# Patient Record
Sex: Female | Born: 1940 | Race: White | Hispanic: No | Smoking: Former smoker
Health system: Southern US, Community
[De-identification: ages and names within clinical notes are randomized; demographics above are authoritative.]

## PROBLEM LIST (undated history)

## (undated) DIAGNOSIS — M1990 Unspecified osteoarthritis, unspecified site: Secondary | ICD-10-CM

## (undated) DIAGNOSIS — I10 Essential (primary) hypertension: Secondary | ICD-10-CM

## (undated) DIAGNOSIS — Z86010 Personal history of colonic polyps: Secondary | ICD-10-CM

## (undated) DIAGNOSIS — M8580 Other specified disorders of bone density and structure, unspecified site: Secondary | ICD-10-CM

## (undated) DIAGNOSIS — A630 Anogenital (venereal) warts: Secondary | ICD-10-CM

## (undated) DIAGNOSIS — Z860101 Personal history of adenomatous and serrated colon polyps: Secondary | ICD-10-CM

## (undated) DIAGNOSIS — E039 Hypothyroidism, unspecified: Secondary | ICD-10-CM

## (undated) DIAGNOSIS — E079 Disorder of thyroid, unspecified: Secondary | ICD-10-CM

## (undated) DIAGNOSIS — J45909 Unspecified asthma, uncomplicated: Secondary | ICD-10-CM

## (undated) DIAGNOSIS — T7840XA Allergy, unspecified, initial encounter: Secondary | ICD-10-CM

## (undated) DIAGNOSIS — D049 Carcinoma in situ of skin, unspecified: Secondary | ICD-10-CM

## (undated) DIAGNOSIS — H269 Unspecified cataract: Secondary | ICD-10-CM

## (undated) DIAGNOSIS — C801 Malignant (primary) neoplasm, unspecified: Secondary | ICD-10-CM

## (undated) DIAGNOSIS — G5600 Carpal tunnel syndrome, unspecified upper limb: Secondary | ICD-10-CM

## (undated) HISTORY — PX: CHOLECYSTECTOMY: SHX55

## (undated) HISTORY — PX: EYE SURGERY: SHX253

## (undated) HISTORY — DX: Unspecified cataract: H26.9

## (undated) HISTORY — PX: GALLBLADDER SURGERY: SHX652

## (undated) HISTORY — DX: Carpal tunnel syndrome, unspecified upper limb: G56.00

## (undated) HISTORY — DX: Unspecified asthma, uncomplicated: J45.909

## (undated) HISTORY — DX: Personal history of adenomatous and serrated colon polyps: Z86.0101

## (undated) HISTORY — PX: CATARACT EXTRACTION, BILATERAL: SHX1313

## (undated) HISTORY — DX: Essential (primary) hypertension: I10

## (undated) HISTORY — PX: TUBAL LIGATION: SHX77

## (undated) HISTORY — DX: Allergy, unspecified, initial encounter: T78.40XA

## (undated) HISTORY — DX: Anogenital (venereal) warts: A63.0

## (undated) HISTORY — PX: BREAST BIOPSY: SHX20

## (undated) HISTORY — DX: Other specified disorders of bone density and structure, unspecified site: M85.80

## (undated) HISTORY — DX: Carcinoma in situ of skin, unspecified: D04.9

## (undated) HISTORY — DX: Personal history of colonic polyps: Z86.010

## (undated) HISTORY — DX: Disorder of thyroid, unspecified: E07.9

## (undated) HISTORY — DX: Unspecified osteoarthritis, unspecified site: M19.90

---

## 1998-09-08 ENCOUNTER — Other Ambulatory Visit: Admission: RE | Admit: 1998-09-08 | Discharge: 1998-09-08 | Payer: Self-pay | Admitting: Obstetrics and Gynecology

## 1998-10-29 ENCOUNTER — Encounter: Admission: RE | Admit: 1998-10-29 | Discharge: 1998-12-02 | Payer: Self-pay | Admitting: Family Medicine

## 1999-01-21 ENCOUNTER — Other Ambulatory Visit: Admission: RE | Admit: 1999-01-21 | Discharge: 1999-01-21 | Payer: Self-pay | Admitting: Obstetrics and Gynecology

## 1999-08-23 ENCOUNTER — Other Ambulatory Visit: Admission: RE | Admit: 1999-08-23 | Discharge: 1999-08-23 | Payer: Self-pay | Admitting: Obstetrics and Gynecology

## 2000-01-21 ENCOUNTER — Other Ambulatory Visit: Admission: RE | Admit: 2000-01-21 | Discharge: 2000-01-21 | Payer: Self-pay | Admitting: Obstetrics and Gynecology

## 2000-07-24 ENCOUNTER — Other Ambulatory Visit: Admission: RE | Admit: 2000-07-24 | Discharge: 2000-07-24 | Payer: Self-pay | Admitting: Obstetrics and Gynecology

## 2001-01-22 ENCOUNTER — Other Ambulatory Visit: Admission: RE | Admit: 2001-01-22 | Discharge: 2001-01-22 | Payer: Self-pay | Admitting: Obstetrics and Gynecology

## 2002-01-24 ENCOUNTER — Other Ambulatory Visit: Admission: RE | Admit: 2002-01-24 | Discharge: 2002-01-24 | Payer: Self-pay | Admitting: Obstetrics and Gynecology

## 2002-06-21 ENCOUNTER — Emergency Department (HOSPITAL_COMMUNITY): Admission: EM | Admit: 2002-06-21 | Discharge: 2002-06-21 | Payer: Self-pay | Admitting: Emergency Medicine

## 2003-02-13 ENCOUNTER — Other Ambulatory Visit: Admission: RE | Admit: 2003-02-13 | Discharge: 2003-02-13 | Payer: Self-pay | Admitting: Obstetrics and Gynecology

## 2004-03-09 ENCOUNTER — Other Ambulatory Visit: Admission: RE | Admit: 2004-03-09 | Discharge: 2004-03-09 | Payer: Self-pay | Admitting: Obstetrics and Gynecology

## 2004-06-16 DIAGNOSIS — D049 Carcinoma in situ of skin, unspecified: Secondary | ICD-10-CM

## 2004-06-16 HISTORY — DX: Carcinoma in situ of skin, unspecified: D04.9

## 2004-07-11 HISTORY — PX: SHOULDER SURGERY: SHX246

## 2005-04-04 ENCOUNTER — Other Ambulatory Visit: Admission: RE | Admit: 2005-04-04 | Discharge: 2005-04-04 | Payer: Self-pay | Admitting: Obstetrics and Gynecology

## 2006-04-06 ENCOUNTER — Other Ambulatory Visit: Admission: RE | Admit: 2006-04-06 | Discharge: 2006-04-06 | Payer: Self-pay | Admitting: Obstetrics & Gynecology

## 2006-04-12 ENCOUNTER — Encounter: Admission: RE | Admit: 2006-04-12 | Discharge: 2006-06-20 | Payer: Self-pay | Admitting: Internal Medicine

## 2008-04-11 ENCOUNTER — Other Ambulatory Visit: Admission: RE | Admit: 2008-04-11 | Discharge: 2008-04-11 | Payer: Self-pay | Admitting: Obstetrics and Gynecology

## 2008-04-22 ENCOUNTER — Other Ambulatory Visit: Admission: RE | Admit: 2008-04-22 | Discharge: 2008-04-22 | Payer: Self-pay | Admitting: Obstetrics and Gynecology

## 2008-12-10 ENCOUNTER — Encounter (INDEPENDENT_AMBULATORY_CARE_PROVIDER_SITE_OTHER): Payer: Self-pay | Admitting: *Deleted

## 2009-01-13 ENCOUNTER — Ambulatory Visit: Payer: Self-pay | Admitting: Internal Medicine

## 2009-01-28 ENCOUNTER — Ambulatory Visit: Payer: Self-pay | Admitting: Internal Medicine

## 2009-01-28 ENCOUNTER — Encounter: Payer: Self-pay | Admitting: Internal Medicine

## 2009-01-29 ENCOUNTER — Encounter: Payer: Self-pay | Admitting: Internal Medicine

## 2009-09-17 ENCOUNTER — Encounter: Payer: Self-pay | Admitting: Internal Medicine

## 2010-08-10 NOTE — Letter (Signed)
Summary: [facility]  [facility]   Imported By: Lester Spindale 09/25/2009 10:52:00  _____________________________________________________________________  External Attachment:    Type:   Image     Comment:   External Document

## 2010-10-06 ENCOUNTER — Telehealth: Payer: Self-pay | Admitting: *Deleted

## 2010-10-06 NOTE — Telephone Encounter (Signed)
See above

## 2010-12-02 ENCOUNTER — Encounter: Payer: Self-pay | Admitting: Internal Medicine

## 2010-12-02 ENCOUNTER — Ambulatory Visit (INDEPENDENT_AMBULATORY_CARE_PROVIDER_SITE_OTHER): Payer: Medicare Other | Admitting: Internal Medicine

## 2010-12-02 VITALS — BP 134/82 | HR 88 | Ht 61.0 in | Wt 152.0 lb

## 2010-12-02 DIAGNOSIS — R195 Other fecal abnormalities: Secondary | ICD-10-CM

## 2010-12-02 NOTE — Patient Instructions (Signed)
Dr Timothy Lasso

## 2010-12-02 NOTE — Progress Notes (Signed)
Emma [name] 1941/01/15 MRN [id number]     History of Present Illness:  This is a 70 year old white female with positive Hemosure cards on a home screening. A repeat exam was negative. She denies any symptoms. Her blood count according to her was normal during her complete physical exam by Dr. Timothy Lasso recently. A colonoscopy in 2003 showed hyperplastic polyps. A recall colonoscopy in July 2010 showed a tubular adenoma and hyperplastic polyp. She is due for a recall colonoscopy in July 2015.   Past Medical History  Diagnosis Date  . Diverticulosis   . Hx of adenomatous colonic polyps   . Constipation   . Hypertension   . Thyroid disease   . Vitamin D deficiency    Past Surgical History  Procedure Date  . Gallbladder surgery   . Cesarean section     x 2   . Shoulder surgery     right   . Breast biopsy     right     reports that she has been smoking.  She has never used smokeless tobacco. She reports that she drinks alcohol. She reports that she does not use illicit drugs. family history includes Diabetes in her paternal grandfather and Heart disease in her father.  There is no history of Colon cancer. Allergies  Allergen Reactions  . Penicillins     REACTION: rash hives        Review of Systems: Denies visible blood per rectum. Occasional constipation. Occasional diarrhea. Denies his symptomatic hemorrhoids.  The remainder of the 10  point ROS is negative except as outlined in H&P   Physical Exam: General appearance  Well developed, in no distress. Eyes- non icteric. HEENT nontraumatic, normocephalic. Mouth no lesions, tongue papillated, no cheilosis. Neck supple without adenopathy, thyroid not enlarged, no carotid bruits, no JVD. Lungs Clear to auscultation bilaterally. Cor normal S1 normal S2, regular rhythm , no murmur,  quiet precordium. Abdomen soft nontender abdomen with normal active bowel sounds. Mildly protuberant. No tenderness. Liver edge at costal  margin. Rectal: Normal perianal area. Normal rectal sphincter tone. Stool is soft Hemoccult-negative. Extremities no pedal edema. Skin no lesions. Neurological alert and oriented x 3. Psychological normal mood and affect.  Assessment and Plan:  Hemoccult-positive stool on a home screening test. This was not reproduced on the subsequent test or on my physical exam today. She is up-to-date on her colonoscopy. She is not anemic. We will hold off on a repeat colonoscopy until her due date in 2015. She understands that if she develops visible blood per rectum or bowel habit change or if she is anemic during one of her physical exams by Dr. Timothy Lasso,  we will reconsider timing of a recall colonoscopy. If no change, she will receive a recall colonoscopy letter for July 2015.   12/02/2010 Lina Sar

## 2011-12-12 ENCOUNTER — Other Ambulatory Visit: Payer: Self-pay | Admitting: Dermatology

## 2013-01-04 ENCOUNTER — Other Ambulatory Visit: Payer: Self-pay | Admitting: Dermatology

## 2013-02-12 DIAGNOSIS — M1711 Unilateral primary osteoarthritis, right knee: Secondary | ICD-10-CM | POA: Insufficient documentation

## 2013-04-01 ENCOUNTER — Telehealth: Payer: Self-pay

## 2013-04-01 NOTE — Telephone Encounter (Signed)
Consult with RA in [place] for tomorrow at 3 pm, aware to arrive at 2:45 Address and phone number to [place] office given

## 2013-04-02 ENCOUNTER — Ambulatory Visit (INDEPENDENT_AMBULATORY_CARE_PROVIDER_SITE_OTHER): Payer: 59 | Admitting: Pulmonary Disease

## 2013-04-02 ENCOUNTER — Encounter: Payer: Self-pay | Admitting: Pulmonary Disease

## 2013-04-02 VITALS — BP 124/76 | HR 71 | Ht 60.5 in | Wt 143.0 lb

## 2013-04-02 DIAGNOSIS — J449 Chronic obstructive pulmonary disease, unspecified: Secondary | ICD-10-CM | POA: Insufficient documentation

## 2013-04-02 DIAGNOSIS — J209 Acute bronchitis, unspecified: Secondary | ICD-10-CM

## 2013-04-02 MED ORDER — ALBUTEROL SULFATE (2.5 MG/3ML) 0.083% IN NEBU: 2.5000 mg | INHALATION_SOLUTION | Freq: Four times a day (QID) | RESPIRATORY_TRACT | Status: DC | PRN

## 2013-04-02 NOTE — Assessment & Plan Note (Signed)
Breathing test (spirometry-pre/post) next visit to decide long term need for advair You have to QUIT smoking

## 2013-04-02 NOTE — Patient Instructions (Signed)
You had an episode of acute bronchitis Complete medrol dosepak Rx for albuterol nebs will be sent x 30 to take as needed OK to stop dymista & take benzonatate as needed Breathing test (spirometry-pre/post) next visit to decide long term need for advair You have to QUIT smoking

## 2013-04-02 NOTE — Assessment & Plan Note (Signed)
You had an episode of acute bronchitis Complete medrol dosepak Rx for albuterol nebs will be sent x 30 to take as needed OK to stop dymista & take benzonatate as needed

## 2013-04-02 NOTE — Progress Notes (Signed)
Subjective:    Patient ID: Emma [name], female    DOB: 07-19-1940, 72 y.o.   MRN: [id number]  HPI 72 year old smoker presents for evaluation of shortness of breath and wheezing. She has been maintained on Advair and albuterol when necessary for rescue for 5 years. She smoked about 30-pack-years starting in her 73s and is now at half-pack per day. She developed sudden onset tickly cough about 10 days ago with wheezing and green phlegm. She went to urgent care, chest x-ray was without infiltrates or effusions. She was given Solu-Medrol IM, azithromycin and oral prednisone. A week later her she went back and was again given a Medrol Dosepak and Levaquin which she did not take .  Pt  Now c/o wheezing, has some cough w/ clear phlem, clear drainage from nose, PND, tickle in throat, breathing is better.  She reports a childhood history of asthma that she grew out of this a young adult. She is maintained on oral methotrexate by her dermatologist at [facility].   Past Medical History  Diagnosis Date  . Hx of adenomatous colonic polyps   . Constipation   . Hypertension   . Thyroid disease   . Vitamin D deficiency   . Asthma     as child    Past Surgical History  Procedure Laterality Date  . Gallbladder surgery    . Cesarean section      x 2   . Shoulder surgery      right   . Breast biopsy      right   . Tubal ligation      Allergies  Allergen Reactions  . Penicillins     REACTION: rash hives    History   Social History  . Marital Status: Divorced    Spouse Name: N/A    Number of Children: 2  . Years of Education: N/A   Occupational History  . Retired    Social History Main Topics  . Smoking status: Current Every Day Smoker -- 0.50 packs/day for 50 years    Types: Cigarettes  . Smokeless tobacco: Never Used  . Alcohol Use: Yes     Comment: socially  . Drug Use: No  . Sexual Activity: Not on file   Other Topics Concern  . Not on file   Social History  Narrative   2 caffeine drinks daily      Review of Systems  Constitutional: Positive for unexpected weight change. Negative for fever.  HENT: Positive for congestion, rhinorrhea, sneezing and postnasal drip. Negative for ear pain, nosebleeds, sore throat, trouble swallowing, dental problem and sinus pressure.   Eyes: Negative for redness and itching.  Respiratory: Positive for cough, shortness of breath and wheezing. Negative for chest tightness.   Cardiovascular: Negative for palpitations and leg swelling.  Gastrointestinal: Negative for nausea and vomiting.  Genitourinary: Negative for dysuria.  Musculoskeletal: Negative for joint swelling.  Skin: Negative for rash.  Neurological: Positive for headaches.  Hematological: Does not bruise/bleed easily.  Psychiatric/Behavioral: Negative for dysphoric mood. The patient is not nervous/anxious.        Objective:   Physical Exam  Gen. Pleasant, well-nourished, in no distress, normal affect ENT - no lesions, no post nasal drip Neck: No JVD, no thyromegaly, no carotid bruits Lungs: no use of accessory muscles, no dullness to percussion, no rales, faint exp rhonchi  Cardiovascular: Rhythm regular, heart sounds  normal, no murmurs or gallops, no peripheral edema Abdomen: soft and non-tender, no hepatosplenomegaly, BS normal.  Musculoskeletal: No deformities, no cyanosis or clubbing Neuro:  alert, non focal       Assessment & Plan:

## 2013-04-05 ENCOUNTER — Telehealth: Payer: Self-pay | Admitting: Pulmonary Disease

## 2013-04-05 MED ORDER — PREDNISONE 10 MG PO TABS: ORAL_TABLET | ORAL | Status: DC

## 2013-04-05 NOTE — Telephone Encounter (Signed)
Prednisone 10 mg tabs  Take 2 tabs daily with food x 5ds, then 1 tab daily with food x 5ds then STOP Albuterol nebs tid

## 2013-04-05 NOTE — Telephone Encounter (Signed)
I spoke with pt. She stated she notice her wheezing was slight worse this AM after doing her albuterol neb tx. She is also having slight increase in SOB w/ activity. Denies any cough and no chest tx. She finished her last medrol dospak tablet this AM. She is requesting further recs. Please advise RA thanks  Allergies  Allergen Reactions  . Penicillins     REACTION: rash hives

## 2013-04-05 NOTE — Telephone Encounter (Signed)
I spoke with pt and is aware of recs. RX sent to the pharmacy. Nothing further needed

## 2013-05-09 ENCOUNTER — Encounter: Payer: Self-pay | Admitting: Pulmonary Disease

## 2013-05-09 ENCOUNTER — Ambulatory Visit (INDEPENDENT_AMBULATORY_CARE_PROVIDER_SITE_OTHER): Payer: 59 | Admitting: Pulmonary Disease

## 2013-05-09 VITALS — BP 128/78 | HR 81 | Temp 97.9°F | Ht 59.75 in | Wt 143.0 lb

## 2013-05-09 DIAGNOSIS — J4489 Other specified chronic obstructive pulmonary disease: Secondary | ICD-10-CM

## 2013-05-09 DIAGNOSIS — J449 Chronic obstructive pulmonary disease, unspecified: Secondary | ICD-10-CM

## 2013-05-09 NOTE — Progress Notes (Signed)
Spirometry pre and post done today. 

## 2013-05-09 NOTE — Patient Instructions (Signed)
OK to stop advair We discussed side effects of steroids You have to focus on smoking cessation We discussed benefits of screening CT scan for lung cancer Flu shot

## 2013-05-09 NOTE — Progress Notes (Signed)
  Subjective:    Patient ID: Emma [name], female    DOB: 01/30/1941, 72 y.o.   MRN: [id number]  HPI  72 year old smoker presents for FU of shortness of breath and wheezing attributed to acute bronchitis.  She has been maintained on Advair and albuterol when necessary for rescue for 5 years. She smoked about 30-pack-years starting in her 73s and is now at half-pack per day. She developed sudden onset tickly cough about 10 days ago with wheezing and green phlegm. She went to urgent care, chest x-ray was without infiltrates or effusions. She was given Solu-Medrol IM, azithromycin and oral prednisone. A week later her she went back and was again given a Medrol Dosepak and Levaquin which she did not take .  She reports a childhood history of asthma that she grew out of this a young adult.  She is maintained on oral methotrexate by her dermatologist at [facility].   05/09/2013  Chief Complaint  Patient presents with  . Follow-up    W/ pre/post spiro. Pt reports breathing is better. Coughs up very little clear phlem. Denies any wheezing and no chest tx.    Spirometry does not show airway obstruction, FEV1 1.61 -90%, FVC 2.10 - 87% with ratio 77 She has questions about lung cancer- wt loss of 4 lbs, about COPD. She wonder sif skin bruising is due to prednisone Pt Now c/o wheezing, has some cough w/ clear phlem, clear drainage from nose, PND, tickle in throat, breathing is better.   Past Medical History  Diagnosis Date  . Hx of adenomatous colonic polyps   . Constipation   . Hypertension   . Thyroid disease   . Vitamin D deficiency   . Asthma     as child     Review of Systems neg for any significant sore throat, dysphagia, itching, sneezing, nasal congestion or excess/ purulent secretions, fever, chills, sweats, unintended wt loss, pleuritic or exertional cp, hempoptysis, orthopnea pnd or change in chronic leg swelling. Also denies presyncope, palpitations, heartburn, abdominal pain,  nausea, vomiting, diarrhea or change in bowel or urinary habits, dysuria,hematuria, rash, arthralgias, visual complaints, headache, numbness weakness or ataxia.     Objective:   Physical Exam  Gen. Pleasant, well-nourished, in no distress ENT - no lesions, no post nasal drip Neck: No JVD, no thyromegaly, no carotid bruits Lungs: no use of accessory muscles, no dullness to percussion, clear without rales or rhonchi  Cardiovascular: Rhythm regular, heart sounds  normal, no murmurs or gallops, no peripheral edema Musculoskeletal: No deformities, no cyanosis or clubbing        Assessment & Plan:

## 2013-05-09 NOTE — Assessment & Plan Note (Signed)
No evidence of airway obstruction -OK to stop advair We discussed side effects of steroids You have to focus on smoking cessation We discussed benefits of screening CT scan for lung cancer Flu shot

## 2013-06-13 ENCOUNTER — Encounter: Payer: Self-pay | Admitting: Pulmonary Disease

## 2013-06-27 ENCOUNTER — Other Ambulatory Visit: Payer: Self-pay | Admitting: Orthopedic Surgery

## 2013-06-27 DIAGNOSIS — M25512 Pain in left shoulder: Secondary | ICD-10-CM

## 2013-07-01 ENCOUNTER — Ambulatory Visit
Admission: RE | Admit: 2013-07-01 | Discharge: 2013-07-01 | Disposition: A | Discharge status: Home / Self Care | Payer: Self-pay | Source: Ambulatory Visit | Attending: Orthopedic Surgery | Admitting: Orthopedic Surgery

## 2013-07-01 DIAGNOSIS — M25512 Pain in left shoulder: Secondary | ICD-10-CM

## 2013-07-07 ENCOUNTER — Other Ambulatory Visit: Payer: 59

## 2013-07-17 HISTORY — PX: SHOULDER SURGERY: SHX246

## 2013-07-18 ENCOUNTER — Ambulatory Visit: Payer: Self-pay | Admitting: Nurse Practitioner

## 2013-07-21 ENCOUNTER — Emergency Department (HOSPITAL_COMMUNITY)
Admission: EM | Admit: 2013-07-21 | Discharge: 2013-07-21 | Discharge status: Absent without leave | Payer: Medicare Other | Source: Home / Self Care

## 2013-07-22 DIAGNOSIS — Z9889 Other specified postprocedural states: Secondary | ICD-10-CM | POA: Insufficient documentation

## 2013-07-24 ENCOUNTER — Ambulatory Visit: Payer: Medicare Other | Attending: Orthopedic Surgery | Admitting: Physical Therapy

## 2013-07-24 DIAGNOSIS — M25519 Pain in unspecified shoulder: Secondary | ICD-10-CM | POA: Insufficient documentation

## 2013-07-24 DIAGNOSIS — IMO0001 Reserved for inherently not codable concepts without codable children: Secondary | ICD-10-CM | POA: Insufficient documentation

## 2013-07-26 ENCOUNTER — Ambulatory Visit: Payer: Medicare Other | Admitting: Physical Therapy

## 2013-07-30 ENCOUNTER — Ambulatory Visit: Payer: Medicare Other | Admitting: Physical Therapy

## 2013-08-02 ENCOUNTER — Ambulatory Visit: Payer: Medicare Other | Admitting: Physical Therapy

## 2013-08-06 ENCOUNTER — Ambulatory Visit: Payer: Medicare Other | Admitting: Physical Therapy

## 2013-08-09 ENCOUNTER — Ambulatory Visit: Payer: Medicare Other | Admitting: Physical Therapy

## 2013-08-13 ENCOUNTER — Ambulatory Visit: Payer: Medicare Other | Attending: Orthopedic Surgery | Admitting: Physical Therapy

## 2013-08-13 DIAGNOSIS — M25519 Pain in unspecified shoulder: Secondary | ICD-10-CM | POA: Insufficient documentation

## 2013-08-13 DIAGNOSIS — IMO0001 Reserved for inherently not codable concepts without codable children: Secondary | ICD-10-CM | POA: Insufficient documentation

## 2013-08-15 ENCOUNTER — Ambulatory Visit: Payer: Medicare Other | Admitting: Physical Therapy

## 2013-08-19 ENCOUNTER — Ambulatory Visit: Payer: Medicare Other | Admitting: Physical Therapy

## 2013-08-20 ENCOUNTER — Ambulatory Visit: Payer: Self-pay | Admitting: Nurse Practitioner

## 2013-08-20 ENCOUNTER — Ambulatory Visit: Payer: Medicare Other | Admitting: Physical Therapy

## 2013-08-23 ENCOUNTER — Ambulatory Visit: Payer: Medicare Other | Admitting: Physical Therapy

## 2013-08-26 DIAGNOSIS — Z79631 Long term (current) use of antimetabolite agent: Secondary | ICD-10-CM | POA: Insufficient documentation

## 2013-08-27 ENCOUNTER — Ambulatory Visit: Payer: Medicare Other | Admitting: Physical Therapy

## 2013-08-30 ENCOUNTER — Ambulatory Visit: Payer: Medicare Other | Admitting: Physical Therapy

## 2013-09-03 ENCOUNTER — Ambulatory Visit: Payer: Medicare Other | Admitting: Physical Therapy

## 2013-09-04 ENCOUNTER — Ambulatory Visit: Payer: Medicare Other | Admitting: Physical Therapy

## 2013-09-05 ENCOUNTER — Ambulatory Visit: Payer: Medicare Other | Admitting: Physical Therapy

## 2013-09-09 ENCOUNTER — Ambulatory Visit: Payer: Medicare Other | Attending: Orthopedic Surgery | Admitting: Physical Therapy

## 2013-09-09 DIAGNOSIS — IMO0001 Reserved for inherently not codable concepts without codable children: Secondary | ICD-10-CM | POA: Insufficient documentation

## 2013-09-09 DIAGNOSIS — M25519 Pain in unspecified shoulder: Secondary | ICD-10-CM | POA: Insufficient documentation

## 2013-09-12 ENCOUNTER — Ambulatory Visit: Payer: Medicare Other | Admitting: Physical Therapy

## 2013-09-13 ENCOUNTER — Ambulatory Visit (INDEPENDENT_AMBULATORY_CARE_PROVIDER_SITE_OTHER): Payer: Medicare Other | Admitting: Nurse Practitioner

## 2013-09-13 ENCOUNTER — Encounter: Payer: Self-pay | Admitting: Nurse Practitioner

## 2013-09-13 VITALS — BP 140/72 | HR 76 | Ht 60.25 in | Wt 145.0 lb

## 2013-09-13 DIAGNOSIS — Z01419 Encounter for gynecological examination (general) (routine) without abnormal findings: Secondary | ICD-10-CM

## 2013-09-13 DIAGNOSIS — E559 Vitamin D deficiency, unspecified: Secondary | ICD-10-CM

## 2013-09-13 DIAGNOSIS — R928 Other abnormal and inconclusive findings on diagnostic imaging of breast: Secondary | ICD-10-CM

## 2013-09-13 DIAGNOSIS — D049 Carcinoma in situ of skin, unspecified: Secondary | ICD-10-CM

## 2013-09-13 DIAGNOSIS — R921 Mammographic calcification found on diagnostic imaging of breast: Secondary | ICD-10-CM

## 2013-09-13 DIAGNOSIS — M899 Disorder of bone, unspecified: Secondary | ICD-10-CM

## 2013-09-13 DIAGNOSIS — Z8619 Personal history of other infectious and parasitic diseases: Secondary | ICD-10-CM

## 2013-09-13 DIAGNOSIS — M8580 Other specified disorders of bone density and structure, unspecified site: Secondary | ICD-10-CM

## 2013-09-13 DIAGNOSIS — M949 Disorder of cartilage, unspecified: Secondary | ICD-10-CM

## 2013-09-13 NOTE — Progress Notes (Signed)
Patient ID: Emma [name], female   DOB: 11-25-1940, 73 y.o.   MRN: [id number] 72 y.o. G2P2. Divorced Caucasian Fe here for annual exam. Recent left shoulder surgery and is in PT; her ROM is getting better.  Not SA.  Patient's last menstrual period was 12/09/1997.          Sexually active: no  The current method of family planning is abstinence and post menopausal status.    Exercising: yes  water aerobics twice weekly.  Not participating currently due to should surgery. Smoker:  yes  Health Maintenance: Pap:  07/17/12, WNL MMG:  09/20/12, needs diagnostic in one year Colonoscopy:  01/2009, repeat in 5 years- to get this year BMD:   08/2010, 0.2/-1.1/-0.5 TDaP:  UTD Shingles vaccine: 2012 ? past few years Labs: PCP   reports that she has been smoking Cigarettes.  She has a 5 pack-year smoking history. She has never used smokeless tobacco. She reports that she drinks alcohol. She reports that she does not use illicit drugs.  Past Medical History  Diagnosis Date  . Hx of adenomatous colonic polyps   . Constipation   . Hypertension   . Thyroid disease   . Vitamin D deficiency   . Asthma     as child    Past Surgical History  Procedure Laterality Date  . Gallbladder surgery    . Cesarean section      x 2   . Shoulder surgery  2006    right   . Breast biopsy      right   . Tubal ligation    . Shoulder surgery Left 07/17/13    refmoval of ca+, bone spurs, and partial tear of rotator cuff    Current Outpatient Prescriptions  Medication Sig Dispense Refill  . ADVAIR DISKUS 250-50 MCG/DOSE AEPB Inhale 1 puff into the lungs daily.       . folic acid (FOLVITE) 1 MG tablet Take 1 mg by mouth daily.      Marland Kitchen gabapentin (NEURONTIN) 300 MG capsule Take 300 mg by mouth daily.       Marland Kitchen ibuprofen (ADVIL,MOTRIN) 600 MG tablet Take 1 tablet by mouth every 8 (eight) hours as needed.      . indapamide (LOZOL) 2.5 MG tablet Take 5 mg by mouth daily.       Marland Kitchen KLOR-CON M20 20 MEQ tablet Take 20 mEq  by mouth 2 (two) times daily.       Marland Kitchen levothyroxine (SYNTHROID, LEVOTHROID) 50 MCG tablet Take 44 mcg by mouth daily.       . methotrexate (RHEUMATREX) 2.5 MG tablet Take 5 mg by mouth once a week.       . montelukast (SINGULAIR) 10 MG tablet Once daily      . Vitamin D, Ergocalciferol, (DRISDOL) 50000 UNITS CAPS Take 50,000 Units by mouth every 14 (fourteen) days.       Marland Kitchen albuterol (PROVENTIL) (2.5 MG/3ML) 0.083% nebulizer solution Take 3 mLs (2.5 mg total) by nebulization every 6 (six) hours as needed. DX 496  360 mL  1  . benzonatate (TESSALON) 100 MG capsule Take 100 mg by mouth 3 (three) times daily as needed for cough.      . Bisacodyl (DULCOLAX PO) Take by mouth. As needed for constipation        No current facility-administered medications for this visit.    Family History  Problem Relation Age of Onset  . Colon cancer Neg Hx   . Diabetes Paternal  Grandfather   . Asthma Son   . Heart disease Father     ROS:  Pertinent items are noted in HPI.  Otherwise, a comprehensive ROS was negative.  Exam:   BP 140/72  Pulse 76  Ht 5' 0.25" (1.53 m)  Wt 145 lb (65.772 kg)  BMI 28.10 kg/m2  LMP 12/09/1997 Height: 5' 0.25" (153 cm)  Ht Readings from Last 3 Encounters:  09/13/13 5' 0.25" (1.53 m)  05/09/13 4' 11.75" (1.518 m)  04/02/13 5' 0.5" (1.537 m)    General appearance: alert, cooperative and appears stated age Head: Normocephalic, without obvious abnormality, atraumatic Neck: no adenopathy, supple, symmetrical, trachea midline and thyroid normal to inspection and palpation Lungs: clear to auscultation bilaterally Breasts: normal appearance, no masses or tenderness Heart: regular rate and rhythm Abdomen: soft, non-tender; no masses,  no organomegaly Extremities: extremities normal, atraumatic, no cyanosis or edema Skin: Skin color, texture, turgor normal. No rashes or lesions Lymph nodes: Cervical, supraclavicular, and axillary nodes normal. No abnormal inguinal nodes  palpated Neurologic: Grossly normal   Pelvic: External genitalia:  no lesions or evidence of condyloma              Urethra:  normal appearing urethra with no masses, tenderness or lesions              Bartholin's and Skene's: normal                 Vagina: normal appearing vagina with normal color and discharge, no lesions              Cervix: anteverted              Pap taken: no Bimanual Exam:  Uterus:  normal size, contour, position, consistency, mobility, non-tender              Adnexa: no mass, fullness, tenderness               Rectovaginal: Confirms               Anus:  normal sphincter tone, no lesions  A:  Well Woman with normal exam  Postmenopausal off HRT since 6/99  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

## 2013-09-13 NOTE — Patient Instructions (Addendum)

## 2013-09-16 ENCOUNTER — Encounter: Payer: Self-pay | Admitting: Nurse Practitioner

## 2013-09-16 ENCOUNTER — Ambulatory Visit: Payer: Medicare Other | Admitting: Physical Therapy

## 2013-09-17 NOTE — Progress Notes (Signed)
Encounter reviewed by Dr. Yaw Escoto Silva.  

## 2013-09-19 ENCOUNTER — Ambulatory Visit: Payer: Medicare Other | Admitting: Physical Therapy

## 2013-09-24 ENCOUNTER — Ambulatory Visit: Payer: Medicare Other | Admitting: Physical Therapy

## 2013-09-25 ENCOUNTER — Ambulatory Visit: Payer: Medicare Other | Admitting: Physical Therapy

## 2013-09-27 ENCOUNTER — Ambulatory Visit: Payer: Medicare Other | Admitting: Physical Therapy

## 2013-10-01 ENCOUNTER — Ambulatory Visit: Payer: Medicare Other | Admitting: Physical Therapy

## 2013-10-04 ENCOUNTER — Ambulatory Visit: Payer: Medicare Other | Admitting: Physical Therapy

## 2013-10-07 ENCOUNTER — Ambulatory Visit: Payer: Medicare Other | Admitting: Physical Therapy

## 2013-10-08 ENCOUNTER — Encounter: Payer: Medicare Other | Admitting: Physical Therapy

## 2013-10-10 ENCOUNTER — Ambulatory Visit: Payer: Medicare Other | Attending: Orthopedic Surgery | Admitting: Physical Therapy

## 2013-10-10 DIAGNOSIS — IMO0001 Reserved for inherently not codable concepts without codable children: Secondary | ICD-10-CM | POA: Insufficient documentation

## 2013-10-10 DIAGNOSIS — M25519 Pain in unspecified shoulder: Secondary | ICD-10-CM | POA: Insufficient documentation

## 2013-10-14 ENCOUNTER — Ambulatory Visit: Payer: Medicare Other | Admitting: Physical Therapy

## 2013-10-14 DIAGNOSIS — IMO0001 Reserved for inherently not codable concepts without codable children: Secondary | ICD-10-CM | POA: Diagnosis not present

## 2013-10-17 ENCOUNTER — Ambulatory Visit: Payer: Medicare Other | Admitting: Physical Therapy

## 2013-10-17 DIAGNOSIS — IMO0001 Reserved for inherently not codable concepts without codable children: Secondary | ICD-10-CM | POA: Diagnosis not present

## 2013-10-21 ENCOUNTER — Ambulatory Visit: Payer: Medicare Other | Admitting: Physical Therapy

## 2013-10-21 DIAGNOSIS — IMO0001 Reserved for inherently not codable concepts without codable children: Secondary | ICD-10-CM | POA: Diagnosis not present

## 2013-10-22 ENCOUNTER — Telehealth: Payer: Self-pay | Admitting: *Deleted

## 2013-10-22 NOTE — Telephone Encounter (Signed)
Pt notified of BMD results per written note on hardcopy.  Pt is agreeable with recommendations and will wait until she sees PCP on Thursday to review Vitamin D results.  Encouraged pt to stop smoking per recommendation.

## 2013-10-23 LAB — PULMONARY FUNCTION TEST
FEF 25-75 Post: 1.55 L/sec
FEF 25-75 Pre: 1.33 L/sec
FEF2575-%Change-Post: 16 %
FEF2575-%Pred-Post: 101 %
FEF2575-%Pred-Pre: 87 %
FEV1-%Change-Post: 5 %
FEV1-%Pred-Post: 95 %
FEV1-%Pred-Pre: 90 %
FEV1-Post: 1.7 L
FEV1-Pre: 1.61 L
FEV1FVC-%Change-Post: 0 %
FEV1FVC-%Pred-Pre: 102 %
FEV6-%Change-Post: 6 %
FEV6-%Pred-Post: 98 %
FEV6-%Pred-Pre: 92 %
FEV6-Post: 2.22 L
FEV6-Pre: 2.09 L
FEV6FVC-%Pred-Post: 105 %
FEV6FVC-%Pred-Pre: 105 %
FVC-%Change-Post: 5 %
FVC-%Pred-Post: 93 %
FVC-%Pred-Pre: 87 %
FVC-Post: 2.22 L
Post FEV1/FVC ratio: 76 %
Post FEV6/FVC ratio: 100 %
Pre FEV1/FVC ratio: 77 %
Pre FEV6/FVC Ratio: 100 %

## 2013-10-24 ENCOUNTER — Ambulatory Visit: Payer: Medicare Other | Admitting: Physical Therapy

## 2013-10-24 DIAGNOSIS — IMO0001 Reserved for inherently not codable concepts without codable children: Secondary | ICD-10-CM | POA: Diagnosis not present

## 2013-10-28 ENCOUNTER — Ambulatory Visit: Payer: Medicare Other | Admitting: Physical Therapy

## 2013-10-28 DIAGNOSIS — IMO0001 Reserved for inherently not codable concepts without codable children: Secondary | ICD-10-CM | POA: Diagnosis not present

## 2013-10-30 ENCOUNTER — Encounter: Payer: Self-pay | Admitting: Internal Medicine

## 2013-10-31 ENCOUNTER — Ambulatory Visit: Payer: Medicare Other | Admitting: Physical Therapy

## 2013-10-31 DIAGNOSIS — IMO0001 Reserved for inherently not codable concepts without codable children: Secondary | ICD-10-CM | POA: Diagnosis not present

## 2013-11-04 ENCOUNTER — Ambulatory Visit: Payer: Medicare Other | Admitting: Physical Therapy

## 2013-11-08 ENCOUNTER — Ambulatory Visit: Payer: Medicare Other | Admitting: Physical Therapy

## 2013-12-03 ENCOUNTER — Encounter: Payer: Self-pay | Admitting: Internal Medicine

## 2013-12-20 ENCOUNTER — Ambulatory Visit (INDEPENDENT_AMBULATORY_CARE_PROVIDER_SITE_OTHER): Payer: Medicare Other

## 2013-12-20 VITALS — BP 159/78 | HR 78 | Resp 16 | Ht 60.5 in | Wt 146.0 lb

## 2013-12-20 DIAGNOSIS — M79606 Pain in leg, unspecified: Secondary | ICD-10-CM

## 2013-12-20 DIAGNOSIS — M79609 Pain in unspecified limb: Secondary | ICD-10-CM

## 2013-12-20 DIAGNOSIS — M7750 Other enthesopathy of unspecified foot: Secondary | ICD-10-CM

## 2013-12-20 DIAGNOSIS — M722 Plantar fascial fibromatosis: Secondary | ICD-10-CM

## 2013-12-20 DIAGNOSIS — M7670 Peroneal tendinitis, unspecified leg: Secondary | ICD-10-CM

## 2013-12-20 NOTE — Progress Notes (Signed)
   Subjective:    Patient ID: Emma [name], female    DOB: Oct 24, 1940, 73 y.o.   MRN: [id number]  HPI Comments: N foot pain L right forefoot and arch plantar D 2 years O athletic shoe wear after 2 hours C burning at forefoot and aching at arch A athletic shoe wear T sandal wearing, changing style of athletic shoes  Pt complains left posterior heel pain during water aerobics, since April and pt stopped exercising 3 month.  Pt states now hurts occasionally when turning the foot.  Foot Pain      Review of Systems  All other systems reviewed and are negative.      Objective:   Physical Exam 73 year old white female presents the office well-developed well-nourished oriented x3. Patient has a complaint of inferior arch pain or ache especially she tries to exercise worse or athletic shoes. She tried seem to cause pain after she been on for a while. She tried asics, she tried Writer and she tried saucony.. Patient continues to have pain points to the midline the plantar fascia both heels also some tenderness along the lateral ankle area. Tendon distribution of the left heel Achilles tendon is not painful tender symptomatic. Them operatory changes noted clinically and radiographically mild inferior calcaneal spurring and thickening plantar fascial structures are noted no signs of fracture no osseous abnormalities at Lisfranc's displacement noted on oblique view bilateral.      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 At this time fascial strapping is applied to both feet also recommended Tylenol or Advil as needed for pain ice pack we utilized the patient are taking methotrexate for arthritis. Patient is also advised to maintain good stable shoe such as a croc or some Birkenstock sandal no  barefoot or flimsy shoes or flip-flops. Again reappointed in 2-3 weeks for followup fascial strapping applied to both feet at this time to assess the benefits of possible future use of orthotic  Harriet Masson DPM

## 2013-12-20 NOTE — Patient Instructions (Signed)

## 2014-01-08 ENCOUNTER — Ambulatory Visit: Payer: Medicare Other

## 2014-01-15 ENCOUNTER — Ambulatory Visit (AMBULATORY_SURGERY_CENTER): Payer: Self-pay | Admitting: *Deleted

## 2014-01-15 VITALS — Ht 60.0 in | Wt 146.2 lb

## 2014-01-15 DIAGNOSIS — Z86010 Personal history of colonic polyps: Secondary | ICD-10-CM

## 2014-01-15 MED ORDER — MOVIPREP 100 G PO SOLR: ORAL | Status: DC

## 2014-01-15 NOTE — Progress Notes (Signed)
No allergies to eggs or soy. No problems with anesthesia.  Pt given Emmi instructions for colonoscopy  No oxygen use  No diet drug use  

## 2014-01-22 ENCOUNTER — Ambulatory Visit (INDEPENDENT_AMBULATORY_CARE_PROVIDER_SITE_OTHER): Payer: Medicare Other

## 2014-01-22 VITALS — BP 152/77 | HR 84 | Resp 12

## 2014-01-22 DIAGNOSIS — M7670 Peroneal tendinitis, unspecified leg: Secondary | ICD-10-CM

## 2014-01-22 DIAGNOSIS — M79606 Pain in leg, unspecified: Secondary | ICD-10-CM

## 2014-01-22 DIAGNOSIS — M778 Other enthesopathies, not elsewhere classified: Secondary | ICD-10-CM

## 2014-01-22 DIAGNOSIS — R609 Edema, unspecified: Secondary | ICD-10-CM

## 2014-01-22 DIAGNOSIS — G5762 Lesion of plantar nerve, left lower limb: Secondary | ICD-10-CM

## 2014-01-22 DIAGNOSIS — G5760 Lesion of plantar nerve, unspecified lower limb: Secondary | ICD-10-CM

## 2014-01-22 DIAGNOSIS — M79609 Pain in unspecified limb: Secondary | ICD-10-CM

## 2014-01-22 DIAGNOSIS — M7750 Other enthesopathy of unspecified foot: Secondary | ICD-10-CM

## 2014-01-22 DIAGNOSIS — M722 Plantar fascial fibromatosis: Secondary | ICD-10-CM

## 2014-01-22 DIAGNOSIS — M779 Enthesopathy, unspecified: Secondary | ICD-10-CM

## 2014-01-22 NOTE — Patient Instructions (Addendum)
WEARING INSTRUCTIONS FOR ORTHOTICS  Don't expect to be comfortable wearing your orthotic devices for the first time.  Like eyeglasses, you may be aware of them as time passes, they will not be uncomfortable and you will enjoy wearing them.  FOLLOW THESE INSTRUCTIONS EXACTLY!  1. Wear your orthotic devices for:       Not more than 1 hour the first day.       Not more than 2 hours the second day.       Not more than 3 hours the third day and so on.        Or wear them for as long as they feel comfortable.       If you experience discomfort in your feet or legs take them out.  When feet & legs feel       better, put them back in.  You do need to be consistent and wear them a little        everyday. 2.   If at any time the orthotic devices become acutely uncomfortable before the       time for that particular day, STOP WEARING THEM. 3.   On the next day, do not increase the wearing time. 4.   Subsequently, increase the wearing time by 15-30 minutes only if comfortable to do       so. 5.   You will be seen by your doctor about 2-4 weeks after you receive your orthotic       devices, at which time you will probably be wearing your devices comfortably        for about 8 hours or more a day. 6.   Some patients occasionally report mild aches or discomfort in other parts of the of       body such as the knees, hips or back after 3 or 4 consecutive hours of wear.  If this       is the case with you, do not extend your wearing time.  Instead, cut it back an hour or       two.  In all likelihood, these symptoms will disappear in a short period of time as your       body posture realigns itself and functions more efficiently. 7.   It is possible that your orthotic device may require some small changes or adjustment       to improve their function or make them more comfortable.   This is usually not done       before one to three months have elapsed.  These adjustments are made in        accordance  with the changed position your feet are assuming as a result of       improved biomechanical function. 8.   In women's shoes, it's not unusual for your heel to slip out of the shoe, particularly if       they are step-in-shoes.  If this is the case, try other shoes or other styles.  Try to       purchase shoes which have deeper heal seats or higher heel counters. 9.   Squeaking of orthotics devices in the shoes is due to the movement of the devices       when they are functioning normally.  To eliminate squeaking, simply dust some       baby powder into your shoes before inserting the devices.  If this does not work,          apply soap or wax to the edges of the orthotic devices or put a tissue into the shoes. 10. It is important that you follow these directions explicitly.  Failure to do so will simply       prolong the adjustment period or create problems which are easily avoided.  It makes       no difference if you are wearing your orthotic devices for only a few hours after        several months, so long as you are wearing them comfortably for those hours. 11. If you have any questions or complaints, contact our office.  We have no way of       knowing about your problems unless you tell us.  If we do not hear from you, we will       assume that you are proceeding well.    ICE INSTRUCTIONS  Apply ice or cold pack to the affected area at least 3 times a day for 10-15 minutes each time.  You should also use ice after prolonged activity or vigorous exercise.  Do not apply ice longer than 20 minutes at one time.  Always keep a cloth between your skin and the ice pack to prevent burns.  Being consistent and following these instructions will help control your symptoms.  We suggest you purchase a gel ice pack because they are reusable and do bit leak.  Some of them are designed to wrap around the area.  Use the method that works best for you.  Here are some other suggestions for icing.   Use a  frozen bag of peas or corn-inexpensive and molds well to your body, usually stays frozen for 10 to 20 minutes.  Wet a towel with cold water and squeeze out the excess until it's damp.  Place in a bag in the freezer for 20 minutes. Then remove and use.

## 2014-01-22 NOTE — Progress Notes (Signed)
   Subjective:    Patient ID: Emma [name], female    DOB: [birth year], 73 y.o.   MRN: [id number]  HPI ''B/L FEET STILL HURTING ESPECIALLY THE LT FOOT.''    Review of Systems no new findings or systemic changes noted     Objective:   Physical Exam 73 year old white female well-developed well-nourished returns he presents this time for followup of previous diagnosis plantar fasciitis and perineal tendinitis and generalized os arthropathy the foot. Should she had improvement of fascial strapping dictating was in place would likely benefit from orthoses however sometime in the last 5-7 days her left foot became exquisitely painful tender and swollen especially in the forefoot at the base of the third digit tenderness on direct lateral compression pain and dorsal flexion plantar flexion of the third toe and there is significant edema the left forefoot in particular third MTP joint compared to the adjacent joints and contralateral right foot. Patient describes no history of injury or trauma however cannot rule out possible stress fracture at this time. Next  Neurovascular status otherwise intact pedal pulses palpable DP +2/4 for PT one over 4 capillary refill time 3 seconds all digits. Epicritic and proprioceptive sensations intact and symmetric. DTRs not elicited nails unremarkable there is no edema no ecchymosis mild edema the left forefoot right foot is asymptomatic in the forefoot and had significant improvement in the plantar fascia arch bilateral. As such we discussed possibly orthoses patient may be beneficial with an OTC type orthotic x-rays taken at this time reveal no sign of fracture no other osseous abnormalities and have concerned about stress fracture however the cortices are all intact no displacements no pain and vibratory sensation is well this may just be a flareup of capsulitis or arthropathy of the left forefoot with subsequent neuroma symptomology second interspace and capsulitis  third MTP joint       Assessment & Plan:  Assessment this time is improving plantar fasciitis as well. Tendinitis with the use of fascial strapping and NSAID therapy I do feel patient would benefit from a functional orthotic or an OTC type power step orthotic. Patient is has a noncovered and we'll try power step orthotic to stabilize the foot arch is may also be beneficial to the forefoot and neuroma symptomology or capsulitis of the left third MTP joint.  Assessment #2 capsulitis third MTP left forefoot edema. Associated capsulitis third MTP joint and possible neuroma symptomology. Second interspace. Plan at this time patient placed in injection of 10 mg Kenalog in 20 mg Xylocaine plain to the second intermetatarsal space for therapeutic nerve block which also be helpful with capsulitis the MTP joint. Patient is dispensed orthotics for use at this time should also help with the fasciitis and capsulitis and posse neuroma symptomology. Instructions for use of the orthoses are given also recommended ice pack and ibuprofen as needed for pain return in one to 2 months if symptoms persist or fail to improve next  Harriet Masson DPM

## 2014-01-23 ENCOUNTER — Encounter: Payer: Self-pay | Admitting: Internal Medicine

## 2014-01-29 ENCOUNTER — Encounter: Payer: Self-pay | Admitting: Internal Medicine

## 2014-01-29 ENCOUNTER — Ambulatory Visit (AMBULATORY_SURGERY_CENTER): Payer: Medicare Other | Admitting: Internal Medicine

## 2014-01-29 VITALS — BP 137/67 | HR 66 | Temp 97.5°F | Resp 14 | Ht 60.0 in | Wt 146.0 lb

## 2014-01-29 DIAGNOSIS — Z86010 Personal history of colonic polyps: Secondary | ICD-10-CM

## 2014-01-29 DIAGNOSIS — D126 Benign neoplasm of colon, unspecified: Secondary | ICD-10-CM

## 2014-01-29 MED ORDER — SODIUM CHLORIDE 0.9 % IV SOLN: 500.0000 mL | INTRAVENOUS | Status: DC

## 2014-01-29 NOTE — Progress Notes (Signed)
Called to room to assist during endoscopic procedure.  Patient ID and intended procedure confirmed with present staff. Received instructions for my participation in the procedure from the performing physician.  

## 2014-01-29 NOTE — Progress Notes (Signed)
Procedure ends, to recovery, report given and VSS. 

## 2014-01-29 NOTE — Op Note (Signed)
[facility]  [address] [name], [address]   COLONOSCOPY PROCEDURE REPORT  PATIENT: [name], Emma  MR#: [id number] BIRTHDATE: [birth year] , 68  yrs. old GENDER: Female ENDOSCOPIST: Lafayette Dragon, MD REFERRED KT:GYBW Virgina Jock, M.D. PROCEDURE DATE:  01/29/2014 PROCEDURE:   Colonoscopy with cold biopsy polypectomy and Colonoscopy with snare polypectomy First Screening Colonoscopy - Avg.  risk and is 50 yrs.  old or older - No.  Prior Negative Screening - Now for repeat screening. N/A  History of Adenoma - Now for follow-up colonoscopy & has been > or = to 3 yrs.  Yes hx of adenoma.  Has been 3 or more years since last colonoscopy.  Polyps Removed Today? Yes. ASA CLASS:   Class II INDICATIONS:Prior colonoscopy in 2003 revealed multiple hyperplastic polyps.  Last colonoscopy July 2010 tubular adenoma removed. MEDICATIONS: MAC sedation, administered by CRNA and Propofol (Diprivan) 250 mg IV  DESCRIPTION OF PROCEDURE:   After the risks benefits and alternatives of the procedure were thoroughly explained, informed consent was obtained.  A digital rectal exam revealed no abnormalities of the rectum.   The LB [id number]-[id number]  endoscope was introduced through the anus and advanced to the cecum, which was identified by both the appendix and ileocecal valve. No adverse events experienced.   The quality of the prep was good, using MoviPrep  The instrument was then slowly withdrawn as the colon was fully examined.      COLON FINDINGS: Four smooth sessile polyps ranging between 3-74mm in size were found in the descending colon.  A polypectomy was performed with cold forceps and with a cold snare.  The resection was complete and the polyp tissue was completely retrieved.   There was moderate diverticulosis noted in the descending colon with associated muscular hypertrophy and luminal narrowing.  Retroflexed views revealed no abnormalities. The time to cecum=5 minutes  510 seconds.  Withdrawal time=11 minutes 51 seconds.  The scope was withdrawn and the procedure completed. COMPLICATIONS: There were no complications.  ENDOSCOPIC IMPRESSION: Four sessile polyps ranging between 3-52mm in size were found in the descending colon; polypectomy was performed with cold forceps and with a cold snare moderately severe diverticulosis of the left colon  RECOMMENDATIONS: 1.  Await pathology results 2.  high fiber diet Recall colonoscopy pending path report   eSigned:  Lafayette Dragon, MD 01/29/2014 11:26 AM   cc:   [name], Emma MR#: [id number]

## 2014-01-29 NOTE — Patient Instructions (Signed)
Discharge instructions given with verbal understanding. Handouts on polyps and diverticulosis. Resume previous medications. YOU HAD AN ENDOSCOPIC PROCEDURE TODAY AT THE [facility]: Refer to the procedure report that was given to you for any specific questions about what was found during the examination.  If the procedure report does not answer your questions, please call your gastroenterologist to clarify.  If you requested that your care partner not be given the details of your procedure findings, then the procedure report has been included in a sealed envelope for you to review at your convenience later.  YOU SHOULD EXPECT: Some feelings of bloating in the abdomen. Passage of more gas than usual.  Walking can help get rid of the air that was put into your GI tract during the procedure and reduce the bloating. If you had a lower endoscopy (such as a colonoscopy or flexible sigmoidoscopy) you may notice spotting of blood in your stool or on the toilet paper. If you underwent a bowel prep for your procedure, then you may not have a normal bowel movement for a few days.  DIET: Your first meal following the procedure should be a light meal and then it is ok to progress to your normal diet.  A half-sandwich or bowl of soup is an example of a good first meal.  Heavy or fried foods are harder to digest and may make you feel nauseous or bloated.  Likewise meals heavy in dairy and vegetables can cause extra gas to form and this can also increase the bloating.  Drink plenty of fluids but you should avoid alcoholic beverages for 24 hours.  ACTIVITY: Your care partner should take you home directly after the procedure.  You should plan to take it easy, moving slowly for the rest of the day.  You can resume normal activity the day after the procedure however you should NOT DRIVE or use heavy machinery for 24 hours (because of the sedation medicines used during the test).    SYMPTOMS TO REPORT  IMMEDIATELY: A gastroenterologist can be reached at any hour.  During normal business hours, 8:30 AM to 5:00 PM Monday through Friday, call [phone].  After hours and on weekends, please call the GI answering service at [phone] who will take a message and have the physician on call contact you.   Following lower endoscopy (colonoscopy or flexible sigmoidoscopy):  Excessive amounts of blood in the stool  Significant tenderness or worsening of abdominal pains  Swelling of the abdomen that is new, acute  Fever of 100F or higher  FOLLOW UP: If any biopsies were taken you will be contacted by phone or by letter within the next 1-3 weeks.  Call your gastroenterologist if you have not heard about the biopsies in 3 weeks.  Our staff will call the home number listed on your records the next business day following your procedure to check on you and address any questions or concerns that you may have at that time regarding the information given to you following your procedure. This is a courtesy call and so if there is no answer at the home number and we have not heard from you through the emergency physician on call, we will assume that you have returned to your regular daily activities without incident.  SIGNATURES/CONFIDENTIALITY: You and/or your care partner have signed paperwork which will be entered into your electronic medical record.  These signatures attest to the fact that that the information above on your After Visit Summary   has been reviewed and is understood.  Full responsibility of the confidentiality of this discharge information lies with you and/or your care-partner. 

## 2014-01-30 ENCOUNTER — Telehealth: Payer: Self-pay

## 2014-01-30 NOTE — Telephone Encounter (Signed)
  Follow up Call-  Call back number 01/29/2014  Post procedure Call Back phone  # [phone]  Permission to leave phone message Yes     Patient questions:  Do you have a fever, pain , or abdominal swelling? No. Pain Score  0 *  Have you tolerated food without any problems? Yes.    Have you been able to return to your normal activities? Yes.    Do you have any questions about your discharge instructions: Diet   No. Medications  No. Follow up visit  No.  Do you have questions or concerns about your Care? No.  Actions: * If pain score is 4 or above: No action needed, pain <4.

## 2014-02-03 ENCOUNTER — Encounter: Payer: Self-pay | Admitting: Internal Medicine

## 2014-03-26 ENCOUNTER — Ambulatory Visit: Payer: Medicare Other

## 2014-05-12 ENCOUNTER — Encounter: Payer: Self-pay | Admitting: Internal Medicine

## 2014-09-16 ENCOUNTER — Encounter: Payer: Self-pay | Admitting: Nurse Practitioner

## 2014-09-16 ENCOUNTER — Ambulatory Visit (INDEPENDENT_AMBULATORY_CARE_PROVIDER_SITE_OTHER): Payer: Medicare Other | Admitting: Nurse Practitioner

## 2014-09-16 VITALS — BP 154/78 | HR 72 | Ht 60.0 in | Wt 147.0 lb

## 2014-09-16 DIAGNOSIS — Z0000 Encounter for general adult medical examination without abnormal findings: Secondary | ICD-10-CM | POA: Diagnosis not present

## 2014-09-16 DIAGNOSIS — N898 Other specified noninflammatory disorders of vagina: Secondary | ICD-10-CM

## 2014-09-16 DIAGNOSIS — Z01419 Encounter for gynecological examination (general) (routine) without abnormal findings: Secondary | ICD-10-CM

## 2014-09-16 MED ORDER — ESTROGENS, CONJUGATED 0.625 MG/GM VA CREA: 1.0000 | TOPICAL_CREAM | Freq: Every day | VAGINAL | Status: DC

## 2014-09-16 NOTE — Progress Notes (Signed)
Patient ID: Emma [name], female   DOB: 10/10/1940, 74 y.o.   MRN: [id number] 74 y.o. G2P2000 Divorced  Caucasian Fe here for annual exam.  Left shoulder surgery with good outcome and good ROM.   Since last year bilateral cataracts.   Patient's last menstrual period was 12/09/1997.          Sexually active: Yes.    The current method of family planning is post menopausal status.    Exercising: Yes.    Gym/ health club routine includes water aerobics twice weekly. Smoker:  no  Health Maintenance: Pap:  07/17/12, negative  MMG:  10/09/13, Bi-Rads 2: benign Colonoscopy:  01/29/14, hyperplastic polyp x 2, repeat in 5 years BMD:   10/09/13, T-Score 1.4/-1.5/-1.2 TDaP:  04/2014 Shingles: 2012 Prevnar: will be done this spring Labs:  [name]   reports that she has been smoking Cigarettes.  She has a 5 pack-year smoking history. She has never used smokeless tobacco. She reports that she drinks about 6.0 oz of alcohol per week. She reports that she does not use illicit drugs.  Past Medical History  Diagnosis Date  . Hx of adenomatous colonic polyps   . Constipation   . Hypertension   . Thyroid disease   . Vitamin D deficiency   . Asthma     as child  . Osteopenia   . Condyloma   . 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 Allergy     Past Surgical History  Procedure Laterality Date  . Gallbladder surgery    . Cesarean section      x 2   . Shoulder surgery  2006    right   . Breast biopsy      right   . Tubal ligation    . Shoulder surgery Left 07/17/13    removal of ca+, bone spurs, and partial tear of rotator cuff  . Cataract extraction, bilateral Bilateral 01/2014, 02/2014    Current Outpatient Prescriptions  Medication Sig Dispense Refill  . ADVAIR DISKUS 250-50 MCG/DOSE AEPB Inhale 1 puff into the lungs daily.     . Bisacodyl (DULCOLAX PO) Take by mouth. As needed for constipation     . conjugated estrogens (PREMARIN) vaginal cream Use 1/2 g vaginally twice  weekly 93.2 g 3  . folic acid (FOLVITE) 1 MG tablet Take 1 mg by mouth daily.    Marland Kitchen gabapentin (NEURONTIN) 300 MG capsule Take 300 mg by mouth daily.     Marland Kitchen ibuprofen (ADVIL,MOTRIN) 600 MG tablet Take 1 tablet by mouth every 8 (eight) hours as needed.    . indapamide (LOZOL) 2.5 MG tablet Take 5 mg by mouth daily.     Marland Kitchen KLOR-CON M20 20 MEQ tablet Take 20 mEq by mouth 2 (two) times daily.     . methotrexate (RHEUMATREX) 2.5 MG tablet Take 5 mg by mouth once a week.     . montelukast (SINGULAIR) 10 MG tablet Once daily    . SYNTHROID 88 MCG tablet Take 44 mcg by mouth daily.  0  . Vitamin D, Ergocalciferol, (DRISDOL) 50000 UNITS CAPS Take 50,000 Units by mouth every 14 (fourteen) days.      No current facility-administered medications for this visit.    Family History  Problem Relation Age of Onset  . Colon cancer Neg Hx   . Diabetes Paternal Grandfather   . Asthma Son   . Heart disease Father     ROS:  Pertinent items are  noted in HPI.  Otherwise, a comprehensive ROS was negative.  Exam:   BP 154/78 mmHg  Pulse 72  Ht 5' (1.524 m)  Wt 147 lb (66.679 kg)  BMI 28.71 kg/m2  LMP 12/09/1997 Height: 5' (152.4 cm) Ht Readings from Last 3 Encounters:  09/16/14 5' (1.524 m)  01/29/14 5' (1.524 m)  01/15/14 5' (1.524 m)    General appearance: alert, cooperative and appears stated age Head: Normocephalic, without obvious abnormality, atraumatic Neck: no adenopathy, supple, symmetrical, trachea midline and thyroid normal to inspection and palpation Lungs: clear to auscultation bilaterally Breasts: normal appearance, no masses or tenderness Heart: regular rate and rhythm Abdomen: soft, non-tender; no masses,  no organomegaly Extremities: extremities normal, atraumatic, no cyanosis or edema Skin: Skin color, texture, turgor normal. No rashes or lesions Lymph nodes: Cervical, supraclavicular, and axillary nodes normal. No abnormal inguinal nodes palpated Neurologic: Grossly  normal   Pelvic: External genitalia:  no lesions              Urethra:  normal appearing urethra with no masses, tenderness or lesions              Bartholin's and Skene's: normal                 Vagina: normal appearing vagina with normal color and discharge, no lesions              Cervix: anteverted              Pap taken: Yes.   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 06/16/2004 Vit D deficiency now done by PCP History of Osteopenia - off Evista  Atrophic vaginitis  P:   Reviewed health and wellness pertinent to exam  Pap smear taken today  Mammogram is due 4/16  Will follow with the Affrim test - most likely vaginal discharge is from atrophy  Premarin vaginal cream 1/2 gm 1-2 times a week  Counseled with risk of DVT,CVA,cancer  Counseled on breast self exam, mammography screening, use and side effects of HRT, adequate intake of calcium and vitamin D, diet and exercise, Kegel's exercises return annually or prn  An After Visit Summary was printed and given to the patient.

## 2014-09-16 NOTE — Patient Instructions (Signed)

## 2014-09-17 LAB — WET PREP BY MOLECULAR PROBE
Candida species: NEGATIVE
Gardnerella vaginalis: NEGATIVE
Trichomonas vaginosis: NEGATIVE

## 2014-09-17 MED ORDER — ESTROGENS, CONJUGATED 0.625 MG/GM VA CREA: TOPICAL_CREAM | VAGINAL | Status: DC

## 2014-09-18 LAB — IPS PAP SMEAR ONLY

## 2014-09-21 NOTE — Progress Notes (Signed)
Encounter reviewed by Dr. Zimere Dunlevy Silva.  

## 2014-09-24 ENCOUNTER — Telehealth: Payer: Self-pay | Admitting: *Deleted

## 2014-09-24 NOTE — Telephone Encounter (Addendum)
Fax Medication Request From [facility] in regards to Premarin Vaginal Cream.  Fax States:  "Co-Pay too high for patient. Requests alternative if possible."  Last AEX:  09/16/14 with PG Next AEX: 09/17/15 with PG Last MMG (if hormonal medication request): 10/09/13 Breast Density Category B:; Bi-Rads 2: Benign Refill authorized: ?  Please advise alternative Ms. Patty.

## 2014-09-25 NOTE — Telephone Encounter (Signed)
With her insurance there may not be a less expensive estrogen vaginal option - she may ask about Vagifem - but usually Premarin is the least expensive.

## 2014-09-25 NOTE — Telephone Encounter (Signed)
Patient called and she said she has used samples of estrace cream in the past for years. Was wondering if Ms. Patty could send in a prescription in for that instead? She asked if this would be cheaper for her then the premarin? I told the patient that we wouldn't know if it would be cheaper or not, and that I would send Ms. Patty a message and give her a call back with what she says..  Please advise Ms. Patty, patient says if we call her back we can leave her a detailed message.

## 2014-09-25 NOTE — Telephone Encounter (Signed)
Patient calling to check on the status of this request.

## 2014-09-26 MED ORDER — ESTRADIOL 0.1 MG/GM VA CREA: TOPICAL_CREAM | VAGINAL | Status: DC

## 2014-09-26 NOTE — Telephone Encounter (Signed)
It is OK for her to have RX for Estrace vaginal cream.  She may have RX for a year. Same directions at 1/2 gm intravaginally and at the urethra pea size amount twice weekly.

## 2014-09-26 NOTE — Telephone Encounter (Signed)
Estrace Vaginal Cream #42.5 gm/3 rfs sent in to [facility] with directions written as below. Left detailed message on patient's vm that rx has been sent and directions (okay per patient).  Routed to provider for review, encounter closed.

## 2014-11-19 ENCOUNTER — Encounter: Payer: Self-pay | Admitting: Internal Medicine

## 2015-04-11 HISTORY — PX: BELPHAROPTOSIS REPAIR: SHX369

## 2015-09-17 ENCOUNTER — Ambulatory Visit: Payer: Medicare Other | Admitting: Nurse Practitioner

## 2015-09-30 ENCOUNTER — Encounter: Payer: Self-pay | Admitting: Nurse Practitioner

## 2015-09-30 ENCOUNTER — Ambulatory Visit (INDEPENDENT_AMBULATORY_CARE_PROVIDER_SITE_OTHER): Payer: Medicare Other | Admitting: Nurse Practitioner

## 2015-09-30 VITALS — BP 132/74 | HR 56 | Ht 60.25 in | Wt 150.0 lb

## 2015-09-30 DIAGNOSIS — M8580 Other specified disorders of bone density and structure, unspecified site: Secondary | ICD-10-CM | POA: Diagnosis not present

## 2015-09-30 DIAGNOSIS — Z0000 Encounter for general adult medical examination without abnormal findings: Secondary | ICD-10-CM | POA: Diagnosis not present

## 2015-09-30 DIAGNOSIS — E559 Vitamin D deficiency, unspecified: Secondary | ICD-10-CM | POA: Diagnosis not present

## 2015-09-30 DIAGNOSIS — Z01419 Encounter for gynecological examination (general) (routine) without abnormal findings: Secondary | ICD-10-CM

## 2015-09-30 NOTE — Patient Instructions (Signed)

## 2015-09-30 NOTE — Progress Notes (Signed)
Patient ID: Emma [name], female   DOB: 01-19-1941, 75 y.o.   MRN: [id number]  75 y.o. G79P2000 Divorced  Caucasian Fe here for annual exam.  She is having problems with low potassium and is being followed closely by PCP.  She is now off Synthroid due to low TSH.  She feels well.  She is not using estrogen vaginal cream secondary to cost.  Not SA.  Patient's last menstrual period was 12/09/1997 (approximate).          Sexually active: No.  The current method of family planning is abstinence.    Exercising: Yes.    Gym/ health club routine includes water aerobics. Smoker:  no  Health Maintenance: Pap:09/16/14, Negative  MMG: 10/13/14, 3D, Bi-Rads 2: Benign Colonoscopy: 01/29/14, hyperplastic polyp x 2, repeat in 5 years, IFOB given at PCP BMD: 10/09/13, T-Score 1.4 Spine / -1.5 Right Femur Neck / -1.2 Left Femur Neck TDaP: 04/2014 Shingles: 2012 Pneumovax: Prevnar 13 2016, series completed Hep C and HIV: Not indicated due to age Labs: [name] takes care of all labs   reports that she has been smoking Cigarettes.  She has a 5 pack-year smoking history. She has never used smokeless tobacco. She reports that she drinks about 6.0 oz of alcohol per week. She reports that she does not use illicit drugs.  Past Medical History  Diagnosis Date  . Hx of adenomatous colonic polyps   . Constipation   . Hypertension   . Thyroid disease   . Vitamin D deficiency   . Asthma     as child  . Osteopenia   . Condyloma   . 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 Allergy     Past Surgical History  Procedure Laterality Date  . Gallbladder surgery    . Cesarean section      x 2   . Shoulder surgery  2006    right   . Breast biopsy      right   . Tubal ligation    . Shoulder surgery Left 07/17/13    removal of ca+, bone spurs, and partial tear of rotator cuff  . Cataract extraction, bilateral Bilateral 01/2014, 02/2014  . 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 ADVAIR DISKUS 250-50 MCG/DOSE AEPB Inhale 1 puff into the lungs daily.     . Bisacodyl (DULCOLAX PO) Take by mouth. As needed for constipation     . folic acid (FOLVITE) 1 MG tablet Take 1 mg by mouth daily.    Marland Kitchen gabapentin (NEURONTIN) 300 MG capsule Take 300 mg by mouth daily.     Marland Kitchen ibuprofen (ADVIL,MOTRIN) 600 MG tablet Take 1 tablet by mouth every 8 (eight) hours as needed.    . indapamide (LOZOL) 2.5 MG tablet Take 5 mg by mouth daily.     Marland Kitchen KLOR-CON M20 20 MEQ tablet Take 20 mEq by mouth 3 (three) times daily.     . methotrexate (RHEUMATREX) 2.5 MG tablet Take 5 mg by mouth once a week.     . montelukast (SINGULAIR) 10 MG tablet Once daily    . Vitamin D, Ergocalciferol, (DRISDOL) 50000 UNITS CAPS Take 50,000 Units by mouth every 14 (fourteen) days.     Marland Kitchen conjugated estrogens (PREMARIN) vaginal cream Use 1/2 g vaginally twice weekly (Patient not taking: Reported on 09/30/2015) 42.5 g 3  . estradiol (ESTRACE VAGINAL) 0.1  MG/GM vaginal cream Use 1/2 gram intravaginally and at the urethra pea size amount twice weekly. (Patient not taking: Reported on 09/30/2015) 42.5 g 3   No current facility-administered medications for this visit.    Family History  Problem Relation Age of Onset  . Colon cancer Neg Hx   . Diabetes Paternal Grandfather   . Asthma Son   . Heart disease Father     ROS:  Pertinent items are noted in HPI.  Otherwise, a comprehensive ROS was negative.  Exam:   BP 132/74 mmHg  Pulse 56  Ht 5' 0.25" (1.53 m)  Wt 150 lb (68.04 kg)  BMI 29.07 kg/m2  LMP 12/09/1997 (Approximate) Height: 5' 0.25" (153 cm) Ht Readings from Last 3 Encounters:  09/30/15 5' 0.25" (1.53 m)  09/16/14 5' (1.524 m)  01/29/14 5' (1.524 m)    General appearance: alert, cooperative and appears stated age Head: Normocephalic, without obvious abnormality, atraumatic Neck: no adenopathy, supple, symmetrical,  trachea midline and thyroid normal to inspection and palpation Lungs: clear to auscultation bilaterally Breasts: normal appearance, no masses or tenderness Heart: regular rate and rhythm Abdomen: soft, non-tender; no masses,  no organomegaly Extremities: extremities normal, atraumatic, no cyanosis or edema Skin: Skin color, texture, turgor normal. No rashes or lesions Lymph nodes: Cervical, supraclavicular, and axillary nodes normal. No abnormal inguinal nodes palpated Neurologic: Grossly normal   Pelvic: External genitalia:  no lesions              Urethra:  normal appearing urethra with no masses, tenderness or lesions              Bartholin's and Skene's: normal                 Vagina: normal appearing vagina with normal color and discharge, no lesions              Cervix: anteverted              Pap taken: No. Bimanual Exam:  Uterus:  normal size, contour, position, consistency, mobility, non-tender              Adnexa: no mass, fullness, tenderness               Rectovaginal: Confirms               Anus:  normal sphincter tone, no lesions  Chaperone present: yes  A:  Well Woman with normal exam  Postmenopausal off HRT since 6/99 History of abnormal pap CIN I/ VAIN I with last Colpo Biopsy 6/ 1999 History of condyloma and Bowen's disease biopsy proven left superior vulva 06/16/2004 Vit D deficiency now done by PCP History of Osteopenia - off Evista Atrophic vaginitis   P:   Reviewed health and wellness pertinent to exam  Pap smear as above  Mammogram is due 10/2015  Counseled on breast self exam, mammography screening, adequate intake of calcium and vitamin D, diet and exercise, Kegel's exercises return annually or prn  An After Visit Summary was printed and given to the patient.

## 2015-10-01 NOTE — Progress Notes (Signed)
Encounter reviewed by Dr. Rebeccah Ivins Amundson C. Silva.  

## 2015-11-24 LAB — IFOBT (OCCULT BLOOD): IMMUNOLOGICAL FECAL OCCULT BLOOD TEST: POSITIVE

## 2016-01-25 ENCOUNTER — Ambulatory Visit: Payer: Medicare Other | Admitting: Podiatry

## 2016-02-29 ENCOUNTER — Encounter: Payer: Self-pay | Admitting: Gastroenterology

## 2016-04-12 ENCOUNTER — Encounter: Payer: Self-pay | Admitting: Gastroenterology

## 2016-04-12 ENCOUNTER — Ambulatory Visit (INDEPENDENT_AMBULATORY_CARE_PROVIDER_SITE_OTHER): Payer: Medicare Other | Admitting: Gastroenterology

## 2016-04-12 ENCOUNTER — Encounter (INDEPENDENT_AMBULATORY_CARE_PROVIDER_SITE_OTHER): Payer: Self-pay

## 2016-04-12 VITALS — BP 144/78 | HR 84 | Ht 61.0 in | Wt 150.0 lb

## 2016-04-12 DIAGNOSIS — R195 Other fecal abnormalities: Secondary | ICD-10-CM

## 2016-04-12 NOTE — Progress Notes (Signed)
[facility] Progress Note  Chief Complaint: heme positive stool   Subjective  History:  Emma [name] was referred by her primary care physician, Dr. Hebert Soho, for heme positive stool. She had a positive Hemosure test in May and again in August. She feels quite well, and denies any digestive symptoms such as change in bowel habits, rectal bleeding, chronic abdominal pain, nausea, vomiting, early satiety, dysphagia or weight loss. She had a visit for the same reason with Dr. Olevia Perches in May 2012, having had a colonoscopy with just 2 subcentimeter tubular adenomas in 2010. Dr. Olevia Perches waited until the next scheduled exam in 2015, which showed only a hyperplastic polyp. Lakida also brought copies of labs done in May, showing a normal hemoglobin. We scanned these into the chart.  ROS: Cardiovascular:  no chest pain Respiratory: no dyspnea  The patient's Past Medical, Family and Social History were reviewed and are on file in the EMR.  Objective:  Med list reviewed  Vital signs in last 24 hrs: Vitals:   04/12/16 1322  BP: (!) 144/78  Pulse: 84    Physical Exam    HEENT: sclera anicteric, oral mucosa moist without lesions  Neck: supple, no thyromegaly, JVD or lymphadenopathy  Cardiac: RRR without murmurs, S1S2 heard, no peripheral edema  Pulm: clear to auscultation bilaterally, normal RR and effort noted  Abdomen: soft, no tenderness, with active bowel sounds. No guarding or palpable hepatosplenomegaly.  Skin; warm and dry, no jaundice or rash Rectal: NST, no palpable internal lesions, Heme-negative stool  Recent Labs:  May 2017 labs scanned.  Hgb 13.7   @ASSESSMENTPLANBEGIN @ Assessment: Encounter Diagnosis  Name Primary?  . Heme positive stool Yes   No symptoms. This appears to be a false positive stool test. Plan: No colonoscopy is necessary at this time. No annual heme test for stool is necessary in this patient, at least not for 5 years after a normal screening  colonoscopy. She recalls getting a letter from Dr. Olevia Perches in 2015 saying she should've a repeat colonoscopy at a 5 year interval, but it is not clear why that should be so. Current screening recommendations say that patient comes back in 10 years unless they have aged out, which will be the case for her.  Total time 20 minutes, over half spent in counseling and coordination of care.   Nelida Meuse III  Dr Shon Baton, [facility]

## 2016-04-12 NOTE — Patient Instructions (Signed)
If you are age 76 or older, your body mass index should be between 23-30. Your Body mass index is 28.34 kg/m. If this is out of the aforementioned range listed, please consider follow up with your Primary Care Provider.  If you are age 19 or younger, your body mass index should be between 19-25. Your Body mass index is 28.34 kg/m. If this is out of the aformentioned range listed, please consider follow up with your Primary Care Provider.   Thank you for choosing [facility]  Dr Wilfrid Lund III

## 2016-10-03 ENCOUNTER — Encounter: Payer: Self-pay | Admitting: Nurse Practitioner

## 2016-10-03 ENCOUNTER — Ambulatory Visit (INDEPENDENT_AMBULATORY_CARE_PROVIDER_SITE_OTHER): Payer: Medicare Other | Admitting: Nurse Practitioner

## 2016-10-03 ENCOUNTER — Ambulatory Visit: Payer: Medicare Other | Admitting: Nurse Practitioner

## 2016-10-03 VITALS — BP 124/60 | HR 72 | Resp 14 | Ht 59.75 in | Wt 154.0 lb

## 2016-10-03 DIAGNOSIS — E559 Vitamin D deficiency, unspecified: Secondary | ICD-10-CM | POA: Diagnosis not present

## 2016-10-03 DIAGNOSIS — Z01411 Encounter for gynecological examination (general) (routine) with abnormal findings: Secondary | ICD-10-CM

## 2016-10-03 DIAGNOSIS — M8588 Other specified disorders of bone density and structure, other site: Secondary | ICD-10-CM | POA: Diagnosis not present

## 2016-10-03 DIAGNOSIS — N952 Postmenopausal atrophic vaginitis: Secondary | ICD-10-CM | POA: Diagnosis not present

## 2016-10-03 DIAGNOSIS — D049 Carcinoma in situ of skin, unspecified: Secondary | ICD-10-CM | POA: Diagnosis not present

## 2016-10-03 NOTE — Progress Notes (Signed)
Encounter reviewed by Dr. Brook Amundson C. Silva.  

## 2016-10-03 NOTE — Patient Instructions (Signed)

## 2016-10-03 NOTE — Progress Notes (Signed)
76 y.o. G18P2000 Divorced  Caucasian Fe here for annual exam. She feels well except for swelling of left ankle.  She was in the yard doing some work and later that day had swelling.  She is unsure if she twisted or turned it.   Patient's last menstrual period was 12/09/1997 (approximate).          Sexually active: No.  The current method of family planning is post menopausal status.    Exercising: Yes.    water aerobics Smoker:  no  Health Maintenance: Pap:  09/16/14, Negative           07/17/12, negative  MMG:  10/14/15 Bi-Rads 2 benign/density b -- scheduled 4/18 Colonoscopy:  01/29/14, hyperplastic polyp x 2,  BMD:   10/09/13, T-Score 1.4 Spine / -1.5 Right Femur Neck / -1.2 Left Femur Neck -- scheduled 4/18 TDaP:   04/2014 Shingles: 2012 Pneumonia: Prevnar 13: 2016, series completed Hep C and HIV: Not indicated due to age Labs: PCP takes care of all labs   reports that she has been smoking Cigarettes.  She has a 25.00 pack-year smoking history. She has never used smokeless tobacco. She reports that she drinks about 6.0 oz of alcohol per week . She reports that she does not use drugs.  Past Medical History:  Diagnosis Date  . Allergy   . Asthma    as child  . 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 Cataract   . Condyloma   . Constipation   . Hx of adenomatous colonic polyps   . Hypertension   . Osteoarthritis   . Osteopenia   . Thyroid disease   . Vitamin D deficiency     Past Surgical History:  Procedure Laterality Date  . BELPHAROPTOSIS REPAIR Bilateral 04/2015   vision correction  . BREAST BIOPSY     right   . CATARACT EXTRACTION, BILATERAL Bilateral 01/2014, 02/2014  . CESAREAN SECTION     x 2   . GALLBLADDER SURGERY    . SHOULDER SURGERY  2006   right   . SHOULDER SURGERY Left 07/17/13   removal of ca+, bone spurs, and partial tear of rotator cuff  . TUBAL LIGATION      Current Outpatient Prescriptions  Medication Sig Dispense Refill  . ADVAIR DISKUS  250-50 MCG/DOSE AEPB Inhale 1 puff into the lungs daily.     . folic acid (FOLVITE) 1 MG tablet Take 1 mg by mouth daily.    Marland Kitchen gabapentin (NEURONTIN) 300 MG capsule Take 300 mg by mouth daily.     Marland Kitchen ibuprofen (ADVIL,MOTRIN) 600 MG tablet Take 1 tablet by mouth every 8 (eight) hours as needed.    . indapamide (LOZOL) 2.5 MG tablet Take 5 mg by mouth daily.     Marland Kitchen KLOR-CON M20 20 MEQ tablet Take 20 mEq by mouth 3 (three) times daily.     . methotrexate (RHEUMATREX) 2.5 MG tablet Take 5 mg by mouth once a week.     . Vitamin D, Ergocalciferol, (DRISDOL) 50000 UNITS CAPS Take 50,000 Units by mouth every 14 (fourteen) days.     . montelukast (SINGULAIR) 10 MG tablet Once daily     No current facility-administered medications for this visit.     Family History  Problem Relation Age of Onset  . Heart disease Father   . Diabetes Paternal Grandfather   . Asthma Son   . Colon cancer Neg Hx     ROS:  Pertinent items  are noted in HPI.  Otherwise, a comprehensive ROS was negative.  Exam:   BP 124/60 (BP Location: Right Arm, Patient Position: Sitting, Cuff Size: Normal)   Pulse 72   Resp 14   Ht 4' 11.75" (1.518 m)   Wt 154 lb (69.9 kg)   LMP 12/09/1997 (Approximate)   BMI 30.33 kg/m  Height: 4' 11.75" (151.8 cm) Ht Readings from Last 3 Encounters:  10/03/16 4' 11.75" (1.518 m)  04/12/16 5\' 1"  (1.549 m)  09/30/15 5' 0.25" (1.53 m)    General appearance: alert, cooperative and appears stated age Head: Normocephalic, without obvious abnormality, atraumatic Neck: no adenopathy, supple, symmetrical, trachea midline and thyroid normal to inspection and palpation Lungs: clear to auscultation bilaterally Breasts: normal appearance, no masses or tenderness Heart: regular rate and rhythm Abdomen: soft, non-tender; no masses,  no organomegaly Extremities: extremities normal, atraumatic, no cyanosis or edema, left ankle swollen but good ROM Skin: Skin color, texture, turgor normal. No rashes or  lesions Lymph nodes: Cervical, supraclavicular, and axillary nodes normal. No abnormal inguinal nodes palpated Neurologic: Grossly normal   Pelvic: External genitalia:  no lesions, no evidence of Bowen's recurrence or condyloma              Urethra:  normal appearing urethra with no masses, tenderness or lesions              Bartholin's and Skene's: normal                 Vagina: normal appearing vagina with normal color and discharge, no lesions              Cervix: anteverted              Pap taken: No. 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

## 2016-10-31 ENCOUNTER — Telehealth: Payer: Self-pay | Admitting: Nurse Practitioner

## 2016-10-31 NOTE — Telephone Encounter (Signed)
Please let pt know that BMD done 10/14/16 shows the spine to be normal with T Score +0.70; the right hip at -1.40 and left hip at -1.10.  The lowest is at the right hip.  In comparison to previous exam dated 10/09/2013 there has been an increase at both hips and a decrease at the spine by -8%.  She must continue to do walking, water aerobics.  Still add upper body weights. Repeat in 2 yrs.

## 2016-11-10 ENCOUNTER — Encounter: Payer: Self-pay | Admitting: Nurse Practitioner

## 2016-11-11 NOTE — Telephone Encounter (Signed)
Patient is notified of results.  She is appreciative of call.  She will call or discuss at next annual exam if she has further questions.  Routing to provider for final review.  Closing encounter.

## 2017-05-31 ENCOUNTER — Other Ambulatory Visit: Payer: Self-pay | Admitting: Orthopedic Surgery

## 2017-07-05 NOTE — Pre-Procedure Instructions (Signed)
Emma [name]  07/05/2017      [id number], [name] [address] Phone: [phone] Fax: [phone]    Your procedure is scheduled on Monday, July 17, 2017  Report to [facility] Admitting Entrance "A" at 7:15AM  Call this number if you have problems the morning of surgery:  [phone]   Remember:  Do not eat food or drink liquids after midnight.  Take these medicines the morning of surgery with A SIP OF WATER: Propylene Glycol (SYSTANE BALANCE OP) and ADVAIR DISKUS. If needed Acetaminophen (TYLENOL) for pain.  7 days before surgery (Dec. 31), stop taking all Aspirins, Vitamins, Fish oils, and Herbal medications. Also stop all NSAIDS i.e. Advil, Ibuprofen, Motrin, Aleve, Anaprox, Naproxen, BC and Goody Powders.   Do not wear jewelry, make-up or nail polish.  Do not wear lotions, powders, perfumes, or deodorant.  Do not shave 48 hours prior to surgery.    Do not bring valuables to the hospital.  [facility] is not responsible for any belongings or valuables.  Contacts, dentures or bridgework may not be worn into surgery.  Leave your suitcase in the car.  After surgery it may be brought to your room.  For patients admitted to the hospital, discharge time will be determined by your treatment team.  Patients discharged the day of surgery will not be allowed to drive home.   Special instructions:   [facility]- Preparing For Surgery  Before surgery, you can play an important role. Because skin is not sterile, your skin needs to be as free of germs as possible. You can reduce the number of germs on your skin by washing with CHG (chlorahexidine gluconate) Soap before surgery.  CHG is an antiseptic cleaner which kills germs and bonds with the skin to continue killing germs even after washing.  Please do not use if you have an allergy to CHG or antibacterial soaps. If your skin  becomes reddened/irritated stop using the CHG.  Do not shave (including legs and underarms) for at least 48 hours prior to first CHG shower. It is OK to shave your face.  Please follow these instructions carefully.   1. Shower the NIGHT BEFORE SURGERY and the MORNING OF SURGERY with CHG.   2. If you chose to wash your hair, wash your hair first as usual with your normal shampoo.  3. After you shampoo, rinse your hair and body thoroughly to remove the shampoo.  4. Use CHG as you would any other liquid soap. You can apply CHG directly to the skin and wash gently with a scrungie or a clean washcloth.   5. Apply the CHG Soap to your body ONLY FROM THE NECK DOWN.  Do not use on open wounds or open sores. Avoid contact with your eyes, ears, mouth and genitals (private parts). Wash Face and genitals (private parts)  with your normal soap.  6. Wash thoroughly, paying special attention to the area where your surgery will be performed.  7. Thoroughly rinse your body with warm water from the neck down.  8. DO NOT shower/wash with your normal soap after using and rinsing off the CHG Soap.  9. Pat yourself dry with a CLEAN TOWEL.  10. Wear CLEAN PAJAMAS to bed the night before surgery, wear comfortable clothes the morning of surgery  11. Place CLEAN SHEETS on your bed the night of your first shower and DO NOT SLEEP WITH  PETS.  Day of Surgery: Do not apply any deodorants/lotions. Please wear clean clothes to the hospital/surgery center.    Please read over the following fact sheets that you were given. Pain Booklet, Coughing and Deep Breathing, Total Joint Packet, MRSA Information and Surgical Site Infection Prevention

## 2017-07-06 ENCOUNTER — Other Ambulatory Visit: Payer: Self-pay

## 2017-07-06 ENCOUNTER — Encounter (HOSPITAL_COMMUNITY): Payer: Self-pay

## 2017-07-06 ENCOUNTER — Encounter (HOSPITAL_COMMUNITY)
Admission: RE | Admit: 2017-07-06 | Discharge: 2017-07-06 | Disposition: A | Discharge status: Home / Self Care | Payer: Medicare Other | Source: Ambulatory Visit | Attending: Orthopedic Surgery | Admitting: Orthopedic Surgery

## 2017-07-06 DIAGNOSIS — M1712 Unilateral primary osteoarthritis, left knee: Secondary | ICD-10-CM | POA: Insufficient documentation

## 2017-07-06 DIAGNOSIS — Z01812 Encounter for preprocedural laboratory examination: Secondary | ICD-10-CM | POA: Insufficient documentation

## 2017-07-06 DIAGNOSIS — I10 Essential (primary) hypertension: Secondary | ICD-10-CM | POA: Insufficient documentation

## 2017-07-06 DIAGNOSIS — E039 Hypothyroidism, unspecified: Secondary | ICD-10-CM | POA: Diagnosis not present

## 2017-07-06 DIAGNOSIS — J45909 Unspecified asthma, uncomplicated: Secondary | ICD-10-CM | POA: Diagnosis not present

## 2017-07-06 DIAGNOSIS — F17200 Nicotine dependence, unspecified, uncomplicated: Secondary | ICD-10-CM | POA: Insufficient documentation

## 2017-07-06 DIAGNOSIS — I498 Other specified cardiac arrhythmias: Secondary | ICD-10-CM | POA: Diagnosis not present

## 2017-07-06 DIAGNOSIS — Z01810 Encounter for preprocedural cardiovascular examination: Secondary | ICD-10-CM | POA: Diagnosis present

## 2017-07-06 HISTORY — DX: Hypothyroidism, unspecified: E03.9

## 2017-07-06 HISTORY — DX: Malignant (primary) neoplasm, unspecified: C80.1

## 2017-07-06 LAB — COMPREHENSIVE METABOLIC PANEL
ALBUMIN: 3.8 g/dL (ref 3.5–5.0)
ALT: 27 U/L (ref 14–54)
AST: 24 U/L (ref 15–41)
Alkaline Phosphatase: 91 U/L (ref 38–126)
Anion gap: 13 (ref 5–15)
BUN: 18 mg/dL (ref 6–20)
CHLORIDE: 100 mmol/L — AB (ref 101–111)
CO2: 27 mmol/L (ref 22–32)
CREATININE: 0.63 mg/dL (ref 0.44–1.00)
Calcium: 9.7 mg/dL (ref 8.9–10.3)
GFR calc non Af Amer: >60 mL/min (ref >60)
GLUCOSE: 79 mg/dL (ref 65–99)
Potassium: 2.9 mmol/L — ABNORMAL LOW (ref 3.5–5.1)
SODIUM: 140 mmol/L (ref 135–145)
Total Bilirubin: 0.6 mg/dL (ref 0.3–1.2)
Total Protein: 6.7 g/dL (ref 6.5–8.1)

## 2017-07-06 LAB — CBC WITH DIFFERENTIAL/PLATELET
BASOS ABS: 0 10*3/uL (ref 0.0–0.1)
BASOS PCT: 0 %
EOS ABS: 0.1 10*3/uL (ref 0.0–0.7)
EOS PCT: 1 %
HCT: 38.7 % (ref 36.0–46.0)
HEMOGLOBIN: 12.6 g/dL (ref 12.0–15.0)
LYMPHS ABS: 2.5 10*3/uL (ref 0.7–4.0)
Lymphocytes Relative: 25 %
MCH: 29.6 pg (ref 26.0–34.0)
MCHC: 32.6 g/dL (ref 30.0–36.0)
MCV: 90.8 fL (ref 78.0–100.0)
Monocytes Absolute: 0.8 10*3/uL (ref 0.1–1.0)
Monocytes Relative: 8 %
NEUTROS PCT: 66 %
Neutro Abs: 6.4 10*3/uL (ref 1.7–7.7)
PLATELETS: 351 10*3/uL (ref 150–400)
RBC: 4.26 MIL/uL (ref 3.87–5.11)
RDW: 14.7 % (ref 11.5–15.5)
WBC: 9.8 10*3/uL (ref 4.0–10.5)

## 2017-07-06 LAB — SURGICAL PCR SCREEN
MRSA, PCR: NEGATIVE
STAPHYLOCOCCUS AUREUS: NEGATIVE

## 2017-07-06 NOTE — Progress Notes (Signed)
Pt. Has swelling in the L ankle that she feles is caused by the pain & swelling at the L knee joint.  Pt. Reports that Dr. Ruel Favors office gave her Prednisone several weeks ago & that  helped with the swelling but its back to where it was before the prednisone was started.  Pt. Denies any chest concerns.  Pt. Followed by [name] for PCP, told pt. Not to hesitate to see or call [name] about the swelling if it continues to bother her.

## 2017-07-07 NOTE — Progress Notes (Signed)
Anesthesia Chart Review:  Pt is a 76 year old female scheduled for L total knee arthroplasty on 07/17/2017 with Vickey Huger, MD  - PCP is Shon Baton, MD  PMH includes:  HTN, asthma (as a child), hypothyroidism. Current smoker. BMI 29.  Medications include: Advair, potassium, methotrexate  BP (!) 162/82   Pulse 86   Temp 36.6 C (Oral)   Resp 20   Ht 4' 11.75" (1.518 m)   Wt 147 lb 11.3 oz (67 kg)   LMP 12/09/1997 (Approximate)   SpO2 100%   BMI 29.09 kg/m   Preoperative labs reviewed.   - K is 2.9. I notified Lolita in Dr. Ruel Favors office who will inform [place], [place].  Will recheck K day of surgery.   EKG 07/06/17: Sinus rhythm with marked sinus arrhythmia. Minimal voltage criteria for LVH, may be normal variant. Nonspecific ST abnormality  If K acceptable day of surgery, I anticipate pt can proceed with surgery as scheduled.   Willeen Cass, FNP-BC [facility]/Anesthesiology Phone: [phone] 07/07/2017 12:41 PM

## 2017-07-09 ENCOUNTER — Other Ambulatory Visit: Payer: Self-pay

## 2017-07-09 ENCOUNTER — Encounter (HOSPITAL_COMMUNITY): Payer: Self-pay | Admitting: Emergency Medicine

## 2017-07-09 ENCOUNTER — Emergency Department (HOSPITAL_COMMUNITY)
Admission: EM | Admit: 2017-07-09 | Discharge: 2017-07-09 | Disposition: A | Discharge status: Home / Self Care | Payer: Medicare Other | Attending: Emergency Medicine | Admitting: Emergency Medicine

## 2017-07-09 DIAGNOSIS — Z79899 Other long term (current) drug therapy: Secondary | ICD-10-CM | POA: Insufficient documentation

## 2017-07-09 DIAGNOSIS — J449 Chronic obstructive pulmonary disease, unspecified: Secondary | ICD-10-CM | POA: Insufficient documentation

## 2017-07-09 DIAGNOSIS — L2489 Irritant contact dermatitis due to other agents: Secondary | ICD-10-CM | POA: Insufficient documentation

## 2017-07-09 DIAGNOSIS — F17210 Nicotine dependence, cigarettes, uncomplicated: Secondary | ICD-10-CM | POA: Diagnosis not present

## 2017-07-09 DIAGNOSIS — Z859 Personal history of malignant neoplasm, unspecified: Secondary | ICD-10-CM | POA: Diagnosis not present

## 2017-07-09 DIAGNOSIS — R21 Rash and other nonspecific skin eruption: Secondary | ICD-10-CM | POA: Diagnosis present

## 2017-07-09 DIAGNOSIS — J45909 Unspecified asthma, uncomplicated: Secondary | ICD-10-CM | POA: Insufficient documentation

## 2017-07-09 LAB — CBC WITH DIFFERENTIAL/PLATELET
Basophils Absolute: 0 10*3/uL (ref 0.0–0.1)
Basophils Relative: 0 %
Eosinophils Absolute: 0.1 10*3/uL (ref 0.0–0.7)
Eosinophils Relative: 1 %
HCT: 39 % (ref 36.0–46.0)
Hemoglobin: 12.8 g/dL (ref 12.0–15.0)
Lymphocytes Relative: 25 %
Lymphs Abs: 2.3 10*3/uL (ref 0.7–4.0)
MCH: 29.6 pg (ref 26.0–34.0)
MCHC: 32.8 g/dL (ref 30.0–36.0)
MCV: 90.3 fL (ref 78.0–100.0)
Monocytes Absolute: 0.8 10*3/uL (ref 0.1–1.0)
Monocytes Relative: 9 %
Neutro Abs: 5.8 10*3/uL (ref 1.7–7.7)
Neutrophils Relative %: 65 %
Platelets: 351 10*3/uL (ref 150–400)
RBC: 4.32 MIL/uL (ref 3.87–5.11)
RDW: 14.6 % (ref 11.5–15.5)
WBC: 9 10*3/uL (ref 4.0–10.5)

## 2017-07-09 LAB — BASIC METABOLIC PANEL
Anion gap: 10 (ref 5–15)
BUN: 18 mg/dL (ref 6–20)
CO2: 25 mmol/L (ref 22–32)
Calcium: 9.8 mg/dL (ref 8.9–10.3)
Chloride: 100 mmol/L — ABNORMAL LOW (ref 101–111)
Creatinine, Ser: 0.69 mg/dL (ref 0.44–1.00)
GFR calc Af Amer: >60 mL/min (ref >60)
GFR calc non Af Amer: >60 mL/min (ref >60)
Glucose, Bld: 90 mg/dL (ref 65–99)
Potassium: 3.1 mmol/L — ABNORMAL LOW (ref 3.5–5.1)
Sodium: 135 mmol/L (ref 135–145)

## 2017-07-09 MED ORDER — PREDNISONE 50 MG PO TABS: 50.0000 mg | ORAL_TABLET | Freq: Every day | ORAL | 0 refills | Status: DC

## 2017-07-09 MED ORDER — TRIAMCINOLONE ACETONIDE 0.1 % EX CREA: 1.0000 "application " | TOPICAL_CREAM | Freq: Two times a day (BID) | CUTANEOUS | 0 refills | Status: DC

## 2017-07-09 MED ORDER — FAMOTIDINE 20 MG PO TABS: 20.0000 mg | ORAL_TABLET | Freq: Two times a day (BID) | ORAL | 0 refills | Status: DC

## 2017-07-09 NOTE — ED Triage Notes (Signed)
Pt has been using pain compound cream to L knee for several months.  On Friday she started having rash to L knee and L lower leg.  For the last hour she has felt sob, increased swallowing, and increased thirst.

## 2017-07-09 NOTE — Discharge Instructions (Signed)
Return here as needed. Follow up with your doctor. Take benadryl also for the itching and reaction

## 2017-07-12 NOTE — ED Provider Notes (Signed)
[facility] Provider Note   CSN: [id number] Arrival date & time: 07/09/17  2010     History   Chief Complaint Chief Complaint  Patient presents with  . Allergic Reaction    HPI Emma [name] is a 77 y.o. female.  HPI   Patient presents to the emergency department with rash to the left knee region along with some to the right knee region.  Patient states that she is unsure of any new soaps lotions or detergents.  She states she has been using a cream on her legs for several months but is never had a problem with this in the past.  Patient states she did not take any medications prior to arrival states the area does itch somewhat.  The area behind her left knee is the most excoriated and inflamed.  The patient denies chest pain, shortness of breath, headache,blurred vision, neck pain, fever, cough, weakness, numbness, dizziness, anorexia, edema, abdominal pain, nausea, vomiting, diarrhea,  back pain, dysuria, hematemesis, bloody stool, near syncope, or syncope. Past Medical History:  Diagnosis Date  . Allergy   . Asthma    as child  . 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 pt. unsure of pathology  . Cataract   . Condyloma   . Constipation   . Hx of adenomatous colonic polyps   . Hypertension   . Hypothyroidism    2016- synthroid d/c'd   . Osteoarthritis    OA- knee- L knee, R thumb  . Osteopenia   . Thyroid disease   . Vitamin D deficiency     Patient Active Problem List   Diagnosis Date Noted  . Acute bronchitis 04/02/2013  . COPD (chronic obstructive pulmonary disease) ([place]) 04/02/2013    Past Surgical History:  Procedure Laterality Date  . BELPHAROPTOSIS REPAIR Bilateral 04/2015   vision correction  . BREAST BIOPSY     right   . CATARACT EXTRACTION, BILATERAL Bilateral 01/2014, 02/2014   /w IOL  . CESAREAN SECTION     x 2   . CHOLECYSTECTOMY    .  GALLBLADDER SURGERY    . SHOULDER SURGERY  2006   right   . SHOULDER SURGERY Left 07/17/2013    bone spurs, and partial tear of rotator cuff  . TUBAL LIGATION      OB History    Gravida Para Term Preterm AB Living   2 2 2     2    SAB TAB Ectopic Multiple Live Births                   Home Medications    Prior to Admission medications   Medication Sig Start Date End Date Taking? Authorizing Provider  acetaminophen (TYLENOL) 500 MG tablet Take 1,000 mg by mouth every 6 (six) hours as needed for mild pain or moderate pain.    [provider]  ADVAIR DISKUS 250-50 MCG/DOSE AEPB Inhale 1 puff into the lungs daily.  11/13/10   [provider]  cholecalciferol (VITAMIN D) 1000 units tablet Take 1,000 Units by mouth daily.    [provider]  famotidine (PEPCID) 20 MG tablet Take 1 tablet (20 mg total) by mouth 2 (two) times daily. 07/09/17   Shandon Burlingame, Harrell Gave, PA-C  folic acid (FOLVITE) 1 MG tablet Take 1 mg by mouth daily.    [provider]  gabapentin (NEURONTIN) 300 MG capsule Take  300 mg by mouth at bedtime.  10/27/10   [provider]  ibuprofen (ADVIL,MOTRIN) 200 MG tablet Take 2 tablets by mouth every 8 (eight) hours as needed for moderate pain.  08/12/13   [provider]  indapamide (LOZOL) 2.5 MG tablet Take 5 mg by mouth daily.     [provider]  KLOR-CON M20 20 MEQ tablet Take 20 mEq by mouth 3 (three) times daily.  12/01/10   [provider]  methotrexate (RHEUMATREX) 2.5 MG tablet Take 2.5-5 mg by mouth once a week. Sunday 12/01/10   [provider]  montelukast (SINGULAIR) 10 MG tablet Take 10 mg by mouth at bedtime as needed. Once daily 03/30/13   [provider]  predniSONE (DELTASONE) 50 MG tablet Take 1 tablet (50 mg total) by mouth daily. 07/09/17   Samiyyah Moffa, Harrell Gave, PA-C  PRESCRIPTION MEDICATION Apply 1 application topically 3 (three) times daily. Baclofen 2%; Doxipen 5%;  Gabapentin 6%; Topiramate 2%; Pentoxifylline 3%  Lipoderm cream base    [provider]  Propylene Glycol (SYSTANE BALANCE OP) Place 1 drop into both eyes daily.    [provider]  triamcinolone cream (KENALOG) 0.1 % Apply 1 application topically 2 (two) times daily. 07/09/17   Devarious Pavek, Harrell Gave, PA-C  Vitamin D, Ergocalciferol, (DRISDOL) 50000 UNITS CAPS Take 50,000 Units by mouth every 7 (seven) days. Sunday 10/08/10   [provider]    Family History Family History  Problem Relation Age of Onset  . Heart disease Father   . Diabetes Paternal Grandfather   . Asthma Son   . Colon cancer Neg Hx     Social History Social History   Tobacco Use  . Smoking status: Current Some Day Smoker    Packs/day: 0.25    Years: 50.00    Pack years: 12.50    Types: Cigarettes  . Smokeless tobacco: Never Used  . Tobacco comment: pt. reports that she smokes 2 cig. / day but somedays none   Substance Use Topics  . Alcohol use: Yes    Alcohol/week: 6.0 oz    Types: 10 Cans of beer per week  . Drug use: No     Allergies   Moxifloxacin and Penicillins   Review of Systems Review of Systems All other systems negative except as documented in the HPI. All pertinent positives and negatives as reviewed in the HPI.  Physical Exam Updated Vital Signs BP (!) 165/57   Pulse 79   Temp 97.8 F (36.6 C) (Oral)   Resp 13   Ht 5' 0.5" (1.537 m)   Wt 66.7 kg (147 lb)   LMP 12/09/1997 (Approximate)   SpO2 100%   BMI 28.24 kg/m   Physical Exam  Constitutional: She is oriented to person, place, and time. She appears well-developed and well-nourished. No distress.  HENT:  Head: Normocephalic and atraumatic.  Eyes: Pupils are equal, round, and reactive to light.  Cardiovascular: Normal rate and regular rhythm. Exam reveals no gallop and no friction rub.  No murmur heard. Pulmonary/Chest: Effort normal. No stridor. No respiratory distress. She has no wheezes.    Neurological: She is alert and oriented to person, place, and time.  Skin: Skin is warm and dry. Rash noted.     Psychiatric: She has a normal mood and affect.  Nursing note and vitals reviewed.    ED Treatments / Results  Labs (all labs ordered are listed, but only abnormal results are displayed) Labs Reviewed  BASIC METABOLIC PANEL - Abnormal;  Notable for the following components:      Result Value   Potassium 3.1 (*)    Chloride 100 (*)    All other components within normal limits  CBC WITH DIFFERENTIAL/PLATELET    EKG  EKG Interpretation None       Radiology No results found.  Procedures Procedures (including critical care time)  Medications Ordered in ED Medications - No data to display   Initial Impression / Assessment and Plan / ED Course  I have reviewed the triage vital signs and the nursing notes.  Pertinent labs & imaging results that were available during my care of the patient were reviewed by me and considered in my medical decision making (see chart for details).     Patient be treated for contact dermatitis.  Referred to her primary doctor told return here as needed patient agrees the plan and all questions were answered.  Final Clinical Impressions(s) / ED Diagnoses   Final diagnoses:  Irritant contact dermatitis due to other agents    ED Discharge Orders        Ordered    predniSONE (DELTASONE) 50 MG tablet  Daily     07/09/17 2306    triamcinolone cream (KENALOG) 0.1 %  2 times daily     07/09/17 2306    famotidine (PEPCID) 20 MG tablet  2 times daily     07/09/17 2306       Dalia Heading, PA-C 07/12/17 2500    Marcha Dutton Forbes Cellar, MD 07/12/17 [phone]

## 2017-07-28 ENCOUNTER — Ambulatory Visit: Payer: Medicare Other | Admitting: Physical Therapy

## 2017-08-07 ENCOUNTER — Other Ambulatory Visit: Payer: Self-pay | Admitting: Orthopedic Surgery

## 2017-08-07 NOTE — Pre-Procedure Instructions (Signed)
Emma [name]  08/07/2017      [id number], [name] [address] Phone: [phone] Fax: [phone]    Your procedure is scheduled on Monday, August 14, 2017  Report to [facility] Admitting Entrance "A" at 8:00AM   Call this number if you have problems the morning of surgery:  [phone]   Remember:  Do not eat food or drink liquids after midnight.  Take these medicines the morning of surgery with A SIP OF WATER: Famotidine (PEPCID), PredniSONE (DELTASONE), SYSTANE eye drops, and ADVAIR DISKUS Inhaler. If needed Acetaminophen (TYLENOL) for pain.  As of today, stop taking all Aspirins, Vitamins, Fish oils, and Herbal medications. Also stop all NSAIDS i.e. Advil, Ibuprofen, Motrin, Aleve, Anaprox, Naproxen, BC and Goody Powders.   Do not wear jewelry, make-up or nail polish.  Do not wear lotions, powders, perfumes, or deodorant.  Do not shave 48 hours prior to surgery.    Do not bring valuables to the hospital.  [facility] is not responsible for any belongings or valuables.  Contacts, dentures or bridgework may not be worn into surgery.  Leave your suitcase in the car.  After surgery it may be brought to your room.  For patients admitted to the hospital, discharge time will be determined by your treatment team.  Patients discharged the day of surgery will not be allowed to drive home.   Special instructions:  [facility]- Preparing For Surgery  Before surgery, you can play an important role. Because skin is not sterile, your skin needs to be as free of germs as possible. You can reduce the number of germs on your skin by washing with CHG (chlorahexidine gluconate) Soap before surgery.  CHG is an antiseptic cleaner which kills germs and bonds with the skin to continue killing germs even after washing.  Please do not use if you have an allergy to CHG or antibacterial soaps. If  your skin becomes reddened/irritated stop using the CHG.  Do not shave (including legs and underarms) for at least 48 hours prior to first CHG shower. It is OK to shave your face.  Please follow these instructions carefully.   1. Shower the NIGHT BEFORE SURGERY and the MORNING OF SURGERY with CHG.   2. If you chose to wash your hair, wash your hair first as usual with your normal shampoo.  3. After you shampoo, rinse your hair and body thoroughly to remove the shampoo.  4. Use CHG as you would any other liquid soap. You can apply CHG directly to the skin and wash gently with a scrungie or a clean washcloth.   5. Apply the CHG Soap to your body ONLY FROM THE NECK DOWN.  Do not use on open wounds or open sores. Avoid contact with your eyes, ears, mouth and genitals (private parts). Wash Face and genitals (private parts)  with your normal soap.  6. Wash thoroughly, paying special attention to the area where your surgery will be performed.  7. Thoroughly rinse your body with warm water from the neck down.  8. DO NOT shower/wash with your normal soap after using and rinsing off the CHG Soap.  9. Pat yourself dry with a CLEAN TOWEL.  10. Wear CLEAN PAJAMAS to bed the night before surgery, wear comfortable clothes the morning of surgery  11. Place CLEAN SHEETS on your bed the night of your first shower and DO NOT SLEEP WITH  PETS.  Day of Surgery: Do not apply any deodorants/lotions. Please wear clean clothes to the hospital/surgery center.    Please read over the following fact sheets that you were given. Pain Booklet, Coughing and Deep Breathing, Total Joint Packet, MRSA Information and Surgical Site Infection Prevention

## 2017-08-08 ENCOUNTER — Encounter (HOSPITAL_COMMUNITY): Payer: Self-pay

## 2017-08-08 ENCOUNTER — Encounter (HOSPITAL_COMMUNITY)
Admission: RE | Admit: 2017-08-08 | Discharge: 2017-08-08 | Disposition: A | Discharge status: Home / Self Care | Payer: Medicare Other | Source: Ambulatory Visit | Attending: Orthopedic Surgery | Admitting: Orthopedic Surgery

## 2017-08-08 ENCOUNTER — Other Ambulatory Visit: Payer: Self-pay

## 2017-08-08 DIAGNOSIS — Z01812 Encounter for preprocedural laboratory examination: Secondary | ICD-10-CM | POA: Diagnosis not present

## 2017-08-08 LAB — CBC WITH DIFFERENTIAL/PLATELET
Basophils Absolute: 0 10*3/uL (ref 0.0–0.1)
Basophils Relative: 0 %
Eosinophils Absolute: 0.1 10*3/uL (ref 0.0–0.7)
Eosinophils Relative: 1 %
HEMATOCRIT: 41 % (ref 36.0–46.0)
HEMOGLOBIN: 13.3 g/dL (ref 12.0–15.0)
LYMPHS PCT: 29 %
Lymphs Abs: 2.1 10*3/uL (ref 0.7–4.0)
MCH: 29.4 pg (ref 26.0–34.0)
MCHC: 32.4 g/dL (ref 30.0–36.0)
MCV: 90.5 fL (ref 78.0–100.0)
MONOS PCT: 7 %
Monocytes Absolute: 0.5 10*3/uL (ref 0.1–1.0)
NEUTROS ABS: 4.5 10*3/uL (ref 1.7–7.7)
NEUTROS PCT: 63 %
Platelets: 362 10*3/uL (ref 150–400)
RBC: 4.53 MIL/uL (ref 3.87–5.11)
RDW: 14.4 % (ref 11.5–15.5)
WBC: 7.2 10*3/uL (ref 4.0–10.5)

## 2017-08-08 LAB — COMPREHENSIVE METABOLIC PANEL
ALK PHOS: 82 U/L (ref 38–126)
ALT: 26 U/L (ref 14–54)
AST: 24 U/L (ref 15–41)
Albumin: 3.8 g/dL (ref 3.5–5.0)
Anion gap: 12 (ref 5–15)
BILIRUBIN TOTAL: 0.5 mg/dL (ref 0.3–1.2)
BUN: 18 mg/dL (ref 6–20)
CALCIUM: 9.9 mg/dL (ref 8.9–10.3)
CO2: 25 mmol/L (ref 22–32)
CREATININE: 0.63 mg/dL (ref 0.44–1.00)
Chloride: 103 mmol/L (ref 101–111)
GFR calc non Af Amer: >60 mL/min (ref >60)
Glucose, Bld: 111 mg/dL — ABNORMAL HIGH (ref 65–99)
Potassium: 3.3 mmol/L — ABNORMAL LOW (ref 3.5–5.1)
SODIUM: 140 mmol/L (ref 135–145)
TOTAL PROTEIN: 6.6 g/dL (ref 6.5–8.1)

## 2017-08-08 LAB — SURGICAL PCR SCREEN
MRSA, PCR: NEGATIVE
STAPHYLOCOCCUS AUREUS: NEGATIVE

## 2017-08-08 NOTE — Progress Notes (Signed)
PCP - Dr. Shon Baton  Cardiologist - Denies  Chest x-ray - Denies  EKG - 07/09/17 (E)  Stress Test - Denies  ECHO - Denies  Cardiac Cath - Denies  Sleep Study - No CPAP - None  LABS- 08/08/17: CBC w/D, CMP   Anesthesia- Yes- Review, previous consult.  Pt denies having chest pain, sob, or fever at this time. All instructions explained to the pt, with a verbal understanding of the material. Pt agrees to go over the instructions while at home for a better understanding. The opportunity to ask questions was provided.

## 2017-08-11 MED ORDER — SODIUM CHLORIDE 0.9 % IV SOLN
1000.0000 mg | INTRAVENOUS | Status: AC
  Administered 2017-08-14: 1000 mg via INTRAVENOUS
  Filled 2017-08-11: qty 1100

## 2017-08-11 MED ORDER — BUPIVACAINE LIPOSOME 1.3 % IJ SUSP
20.0000 mL | INTRAMUSCULAR | Status: AC
  Administered 2017-08-14: 20 mL
  Filled 2017-08-11: qty 20

## 2017-08-11 MED ORDER — GABAPENTIN 300 MG PO CAPS
300.0000 mg | ORAL_CAPSULE | Freq: Once | ORAL | Status: AC
  Administered 2017-08-14: 300 mg via ORAL
  Filled 2017-08-11: qty 1

## 2017-08-11 MED ORDER — DEXAMETHASONE SODIUM PHOSPHATE 10 MG/ML IJ SOLN
8.0000 mg | Freq: Once | INTRAMUSCULAR | Status: AC
  Administered 2017-08-14: 10 mg via INTRAVENOUS
  Filled 2017-08-11: qty 1

## 2017-08-11 MED ORDER — CLINDAMYCIN PHOSPHATE 900 MG/50ML IV SOLN
900.0000 mg | INTRAVENOUS | Status: AC
  Administered 2017-08-14: 900 mg via INTRAVENOUS
  Filled 2017-08-11: qty 50

## 2017-08-11 MED ORDER — ACETAMINOPHEN 500 MG PO TABS
1000.0000 mg | ORAL_TABLET | Freq: Once | ORAL | Status: AC
  Administered 2017-08-14: 1000 mg via ORAL
  Filled 2017-08-11: qty 2

## 2017-08-14 ENCOUNTER — Encounter (HOSPITAL_COMMUNITY): Payer: Self-pay | Admitting: *Deleted

## 2017-08-14 ENCOUNTER — Ambulatory Visit (HOSPITAL_COMMUNITY): Payer: Medicare Other | Admitting: Emergency Medicine

## 2017-08-14 ENCOUNTER — Encounter (HOSPITAL_COMMUNITY)
Admission: AD | Disposition: A | Discharge status: Skilled Nursing Facility | Payer: Self-pay | Source: Ambulatory Visit | Attending: Orthopedic Surgery

## 2017-08-14 ENCOUNTER — Inpatient Hospital Stay (HOSPITAL_COMMUNITY)
Admission: AD | Admit: 2017-08-14 | Discharge: 2017-08-16 | DRG: 470 | Disposition: A | Discharge status: Skilled Nursing Facility | Payer: Medicare Other | Source: Ambulatory Visit | Attending: Orthopedic Surgery | Admitting: Orthopedic Surgery

## 2017-08-14 DIAGNOSIS — I10 Essential (primary) hypertension: Secondary | ICD-10-CM | POA: Diagnosis present

## 2017-08-14 DIAGNOSIS — F17210 Nicotine dependence, cigarettes, uncomplicated: Secondary | ICD-10-CM | POA: Diagnosis present

## 2017-08-14 DIAGNOSIS — Z96659 Presence of unspecified artificial knee joint: Secondary | ICD-10-CM

## 2017-08-14 DIAGNOSIS — E039 Hypothyroidism, unspecified: Secondary | ICD-10-CM | POA: Diagnosis present

## 2017-08-14 DIAGNOSIS — Z79899 Other long term (current) drug therapy: Secondary | ICD-10-CM

## 2017-08-14 DIAGNOSIS — J449 Chronic obstructive pulmonary disease, unspecified: Secondary | ICD-10-CM | POA: Diagnosis present

## 2017-08-14 DIAGNOSIS — M1712 Unilateral primary osteoarthritis, left knee: Secondary | ICD-10-CM | POA: Diagnosis not present

## 2017-08-14 DIAGNOSIS — M25562 Pain in left knee: Secondary | ICD-10-CM | POA: Diagnosis present

## 2017-08-14 DIAGNOSIS — Z9049 Acquired absence of other specified parts of digestive tract: Secondary | ICD-10-CM

## 2017-08-14 HISTORY — PX: TOTAL KNEE ARTHROPLASTY: SHX125

## 2017-08-14 LAB — POTASSIUM: Potassium: 3.1 mmol/L — ABNORMAL LOW (ref 3.5–5.1)

## 2017-08-14 LAB — POCT I-STAT 4, (NA,K, GLUC, HGB,HCT)
GLUCOSE: 98 mg/dL (ref 65–99)
HEMATOCRIT: 40 % (ref 36.0–46.0)
HEMOGLOBIN: 13.6 g/dL (ref 12.0–15.0)
Potassium: 6.5 mmol/L (ref 3.5–5.1)
Sodium: 136 mmol/L (ref 135–145)

## 2017-08-14 SURGERY — ARTHROPLASTY, KNEE, TOTAL
Anesthesia: Monitor Anesthesia Care | Site: Knee | Laterality: Left

## 2017-08-14 MED ORDER — MIDAZOLAM HCL 2 MG/2ML IJ SOLN
INTRAMUSCULAR | Status: AC
  Administered 2017-08-14: 2 mg via INTRAVENOUS
  Filled 2017-08-14: qty 2

## 2017-08-14 MED ORDER — PROPOFOL 10 MG/ML IV BOLUS
INTRAVENOUS | Status: AC
  Filled 2017-08-14: qty 20

## 2017-08-14 MED ORDER — METOCLOPRAMIDE HCL 5 MG PO TABS: 5.0000 mg | ORAL_TABLET | Freq: Three times a day (TID) | ORAL | Status: DC | PRN

## 2017-08-14 MED ORDER — ACETAMINOPHEN 650 MG RE SUPP: 650.0000 mg | RECTAL | Status: DC | PRN

## 2017-08-14 MED ORDER — SODIUM CHLORIDE 0.9 % IR SOLN
Status: DC | PRN
  Administered 2017-08-14: 3000 mL

## 2017-08-14 MED ORDER — 0.9 % SODIUM CHLORIDE (POUR BTL) OPTIME
TOPICAL | Status: DC | PRN
  Administered 2017-08-14: 1000 mL

## 2017-08-14 MED ORDER — MIDAZOLAM HCL 2 MG/2ML IJ SOLN
2.0000 mg | Freq: Once | INTRAMUSCULAR | Status: AC
  Administered 2017-08-14: 2 mg via INTRAVENOUS

## 2017-08-14 MED ORDER — ACETAMINOPHEN 500 MG PO TABS
1000.0000 mg | ORAL_TABLET | Freq: Four times a day (QID) | ORAL | Status: AC
  Administered 2017-08-14 – 2017-08-15 (×3): 1000 mg via ORAL
  Filled 2017-08-14 (×4): qty 2

## 2017-08-14 MED ORDER — PROPOFOL 10 MG/ML IV BOLUS
INTRAVENOUS | Status: DC | PRN
  Administered 2017-08-14: 20 mg via INTRAVENOUS

## 2017-08-14 MED ORDER — BUPIVACAINE-EPINEPHRINE (PF) 0.25% -1:200000 IJ SOLN
INTRAMUSCULAR | Status: DC | PRN
  Administered 2017-08-14: 30 mL

## 2017-08-14 MED ORDER — PHENOL 1.4 % MT LIQD: 1.0000 | OROMUCOSAL | Status: DC | PRN

## 2017-08-14 MED ORDER — LACTATED RINGERS IV SOLN
INTRAVENOUS | Status: DC
  Administered 2017-08-14: 09:00:00 via INTRAVENOUS

## 2017-08-14 MED ORDER — ZOLPIDEM TARTRATE 5 MG PO TABS: 5.0000 mg | ORAL_TABLET | Freq: Every evening | ORAL | Status: DC | PRN

## 2017-08-14 MED ORDER — METHOTREXATE 2.5 MG PO TABS: 2.5000 mg | ORAL_TABLET | ORAL | Status: DC

## 2017-08-14 MED ORDER — METHOCARBAMOL 500 MG PO TABS: 500.0000 mg | ORAL_TABLET | Freq: Four times a day (QID) | ORAL | Status: DC | PRN

## 2017-08-14 MED ORDER — GABAPENTIN 300 MG PO CAPS
300.0000 mg | ORAL_CAPSULE | Freq: Every day | ORAL | Status: DC
  Administered 2017-08-14: 300 mg via ORAL
  Filled 2017-08-14 (×2): qty 1

## 2017-08-14 MED ORDER — HYDROCODONE-ACETAMINOPHEN 7.5-325 MG PO TABS
1.0000 | ORAL_TABLET | Freq: Four times a day (QID) | ORAL | Status: DC
  Administered 2017-08-14 – 2017-08-16 (×8): 1 via ORAL
  Filled 2017-08-14 (×8): qty 1

## 2017-08-14 MED ORDER — TRANEXAMIC ACID 1000 MG/10ML IV SOLN
1000.0000 mg | Freq: Once | INTRAVENOUS | Status: AC
  Administered 2017-08-14: 1000 mg via INTRAVENOUS
  Filled 2017-08-14 (×2): qty 10

## 2017-08-14 MED ORDER — LACTATED RINGERS IV SOLN
INTRAVENOUS | Status: DC | PRN
  Administered 2017-08-14 (×2): via INTRAVENOUS

## 2017-08-14 MED ORDER — GABAPENTIN 300 MG PO CAPS
300.0000 mg | ORAL_CAPSULE | Freq: Three times a day (TID) | ORAL | Status: DC
  Administered 2017-08-14 – 2017-08-16 (×5): 300 mg via ORAL
  Filled 2017-08-14 (×5): qty 1

## 2017-08-14 MED ORDER — FAMOTIDINE 20 MG PO TABS
20.0000 mg | ORAL_TABLET | Freq: Two times a day (BID) | ORAL | Status: DC
  Administered 2017-08-14 – 2017-08-16 (×4): 20 mg via ORAL
  Filled 2017-08-14 (×4): qty 1

## 2017-08-14 MED ORDER — MENTHOL 3 MG MT LOZG: 1.0000 | LOZENGE | OROMUCOSAL | Status: DC | PRN

## 2017-08-14 MED ORDER — BUPIVACAINE IN DEXTROSE 0.75-8.25 % IT SOLN
INTRATHECAL | Status: DC | PRN
  Administered 2017-08-14: 2 mL via INTRATHECAL

## 2017-08-14 MED ORDER — ALUM & MAG HYDROXIDE-SIMETH 200-200-20 MG/5ML PO SUSP: 30.0000 mL | ORAL | Status: DC | PRN

## 2017-08-14 MED ORDER — FLEET ENEMA 7-19 GM/118ML RE ENEM: 1.0000 | ENEMA | Freq: Once | RECTAL | Status: DC | PRN

## 2017-08-14 MED ORDER — POTASSIUM CHLORIDE CRYS ER 20 MEQ PO TBCR
40.0000 meq | EXTENDED_RELEASE_TABLET | Freq: Two times a day (BID) | ORAL | Status: DC
  Administered 2017-08-14 – 2017-08-16 (×4): 40 meq via ORAL
  Filled 2017-08-14 (×4): qty 2

## 2017-08-14 MED ORDER — FENTANYL CITRATE (PF) 100 MCG/2ML IJ SOLN
INTRAMUSCULAR | Status: AC
  Administered 2017-08-14: 50 ug via INTRAVENOUS
  Filled 2017-08-14: qty 2

## 2017-08-14 MED ORDER — DIPHENHYDRAMINE HCL 12.5 MG/5ML PO ELIX: 12.5000 mg | ORAL_SOLUTION | ORAL | Status: DC | PRN

## 2017-08-14 MED ORDER — FENTANYL CITRATE (PF) 250 MCG/5ML IJ SOLN
INTRAMUSCULAR | Status: AC
  Filled 2017-08-14: qty 5

## 2017-08-14 MED ORDER — CLINDAMYCIN PHOSPHATE 600 MG/50ML IV SOLN
600.0000 mg | Freq: Four times a day (QID) | INTRAVENOUS | Status: AC
  Administered 2017-08-14 (×2): 600 mg via INTRAVENOUS
  Filled 2017-08-14 (×2): qty 50

## 2017-08-14 MED ORDER — ROPIVACAINE HCL 7.5 MG/ML IJ SOLN
INTRAMUSCULAR | Status: DC | PRN
  Administered 2017-08-14: 20 mL via PERINEURAL

## 2017-08-14 MED ORDER — ONDANSETRON HCL 4 MG PO TABS: 4.0000 mg | ORAL_TABLET | Freq: Four times a day (QID) | ORAL | Status: DC | PRN

## 2017-08-14 MED ORDER — SODIUM CHLORIDE 0.9 % IJ SOLN
INTRAMUSCULAR | Status: DC | PRN
  Administered 2017-08-14: 20 mL

## 2017-08-14 MED ORDER — CHLORHEXIDINE GLUCONATE 4 % EX LIQD: 60.0000 mL | Freq: Once | CUTANEOUS | Status: DC

## 2017-08-14 MED ORDER — ONDANSETRON HCL 4 MG/2ML IJ SOLN
INTRAMUSCULAR | Status: DC | PRN
  Administered 2017-08-14: 4 mg via INTRAVENOUS

## 2017-08-14 MED ORDER — PROPOFOL 500 MG/50ML IV EMUL
INTRAVENOUS | Status: DC | PRN
  Administered 2017-08-14: 20 ug/kg/min via INTRAVENOUS

## 2017-08-14 MED ORDER — FENTANYL CITRATE (PF) 250 MCG/5ML IJ SOLN
INTRAMUSCULAR | Status: DC | PRN
  Administered 2017-08-14: 50 ug via INTRAVENOUS

## 2017-08-14 MED ORDER — DOCUSATE SODIUM 100 MG PO CAPS
100.0000 mg | ORAL_CAPSULE | Freq: Two times a day (BID) | ORAL | Status: DC
  Administered 2017-08-14 – 2017-08-16 (×4): 100 mg via ORAL
  Filled 2017-08-14 (×4): qty 1

## 2017-08-14 MED ORDER — INDAPAMIDE 2.5 MG PO TABS
5.0000 mg | ORAL_TABLET | Freq: Every day | ORAL | Status: DC
  Administered 2017-08-14 – 2017-08-16 (×3): 5 mg via ORAL
  Filled 2017-08-14 (×3): qty 2

## 2017-08-14 MED ORDER — MOMETASONE FURO-FORMOTEROL FUM 200-5 MCG/ACT IN AERO
2.0000 | INHALATION_SPRAY | Freq: Two times a day (BID) | RESPIRATORY_TRACT | Status: DC
  Administered 2017-08-15 – 2017-08-16 (×2): 2 via RESPIRATORY_TRACT
  Filled 2017-08-14 (×2): qty 8.8

## 2017-08-14 MED ORDER — ONDANSETRON HCL 4 MG/2ML IJ SOLN: 4.0000 mg | Freq: Four times a day (QID) | INTRAMUSCULAR | Status: DC | PRN

## 2017-08-14 MED ORDER — BISACODYL 5 MG PO TBEC: 5.0000 mg | DELAYED_RELEASE_TABLET | Freq: Every day | ORAL | Status: DC | PRN

## 2017-08-14 MED ORDER — FENTANYL CITRATE (PF) 100 MCG/2ML IJ SOLN
50.0000 ug | Freq: Once | INTRAMUSCULAR | Status: AC
  Administered 2017-08-14: 50 ug via INTRAVENOUS

## 2017-08-14 MED ORDER — ACETAMINOPHEN 325 MG PO TABS: 650.0000 mg | ORAL_TABLET | ORAL | Status: DC | PRN

## 2017-08-14 MED ORDER — ASPIRIN EC 325 MG PO TBEC
325.0000 mg | DELAYED_RELEASE_TABLET | Freq: Two times a day (BID) | ORAL | Status: DC
  Administered 2017-08-14 – 2017-08-16 (×4): 325 mg via ORAL
  Filled 2017-08-14 (×4): qty 1

## 2017-08-14 MED ORDER — SENNOSIDES-DOCUSATE SODIUM 8.6-50 MG PO TABS: 1.0000 | ORAL_TABLET | Freq: Every evening | ORAL | Status: DC | PRN

## 2017-08-14 MED ORDER — METHOCARBAMOL 1000 MG/10ML IJ SOLN
500.0000 mg | Freq: Four times a day (QID) | INTRAMUSCULAR | Status: DC | PRN
  Filled 2017-08-14: qty 5

## 2017-08-14 MED ORDER — HYDROMORPHONE HCL 1 MG/ML IJ SOLN: 0.5000 mg | INTRAMUSCULAR | Status: DC | PRN

## 2017-08-14 MED ORDER — DEXAMETHASONE SODIUM PHOSPHATE 10 MG/ML IJ SOLN
10.0000 mg | Freq: Once | INTRAMUSCULAR | Status: AC
  Administered 2017-08-15: 10 mg via INTRAVENOUS
  Filled 2017-08-14: qty 1

## 2017-08-14 MED ORDER — PHENYLEPHRINE HCL 10 MG/ML IJ SOLN
INTRAMUSCULAR | Status: DC | PRN
  Administered 2017-08-14: 15 ug/min via INTRAVENOUS

## 2017-08-14 MED ORDER — OXYCODONE HCL 5 MG PO TABS: 5.0000 mg | ORAL_TABLET | ORAL | Status: DC | PRN

## 2017-08-14 MED ORDER — METOCLOPRAMIDE HCL 5 MG/ML IJ SOLN: 5.0000 mg | Freq: Three times a day (TID) | INTRAMUSCULAR | Status: DC | PRN

## 2017-08-14 MED ORDER — MIDAZOLAM HCL 2 MG/2ML IJ SOLN
INTRAMUSCULAR | Status: AC
  Filled 2017-08-14: qty 2

## 2017-08-14 SURGICAL SUPPLY — 62 items
BANDAGE ACE 6X5 VEL STRL LF (GAUZE/BANDAGES/DRESSINGS) ×3 IMPLANT
BANDAGE ESMARK 6X9 LF (GAUZE/BANDAGES/DRESSINGS) ×1 IMPLANT
BLADE SAGITTAL 13X1.27X60 (BLADE) ×2 IMPLANT
BLADE SAGITTAL 13X1.27X60MM (BLADE) ×1
BLADE SAW SGTL 83.5X18.5 (BLADE) ×3 IMPLANT
BLADE SURG 10 STRL SS (BLADE) ×3 IMPLANT
BNDG CMPR 9X6 STRL LF SNTH (GAUZE/BANDAGES/DRESSINGS) ×1
BNDG ESMARK 6X9 LF (GAUZE/BANDAGES/DRESSINGS) ×3
BOWL SMART MIX CTS (DISPOSABLE) ×3 IMPLANT
CAPT KNEE TOTAL 3 ×3 IMPLANT
CEMENT BONE SIMPLEX SPEEDSET (Cement) ×6 IMPLANT
CLOSURE WOUND 1/2 X4 (GAUZE/BANDAGES/DRESSINGS) ×1
COVER SURGICAL LIGHT HANDLE (MISCELLANEOUS) ×3 IMPLANT
CUFF TOURNIQUET SINGLE 34IN LL (TOURNIQUET CUFF) ×3 IMPLANT
DRAPE EXTREMITY T 121X128X90 (DRAPE) ×3 IMPLANT
DRAPE HALF SHEET 40X57 (DRAPES) ×3 IMPLANT
DRAPE INCISE IOBAN 66X45 STRL (DRAPES) ×6 IMPLANT
DRAPE U-SHAPE 47X51 STRL (DRAPES) ×3 IMPLANT
DRSG AQUACEL AG ADV 3.5X10 (GAUZE/BANDAGES/DRESSINGS) ×3 IMPLANT
DURAPREP 26ML APPLICATOR (WOUND CARE) ×6 IMPLANT
ELECT REM PT RETURN 9FT ADLT (ELECTROSURGICAL) ×3
ELECTRODE REM PT RTRN 9FT ADLT (ELECTROSURGICAL) ×1 IMPLANT
GLOVE BIOGEL M 7.0 STRL (GLOVE) IMPLANT
GLOVE BIOGEL PI IND STRL 7.5 (GLOVE) IMPLANT
GLOVE BIOGEL PI IND STRL 8.5 (GLOVE) ×1 IMPLANT
GLOVE BIOGEL PI INDICATOR 7.5 (GLOVE)
GLOVE BIOGEL PI INDICATOR 8.5 (GLOVE) ×2
GLOVE SURG ORTHO 8.0 STRL STRW (GLOVE) ×6 IMPLANT
GOWN STRL REUS W/ TWL LRG LVL3 (GOWN DISPOSABLE) ×1 IMPLANT
GOWN STRL REUS W/ TWL XL LVL3 (GOWN DISPOSABLE) ×2 IMPLANT
GOWN STRL REUS W/TWL 2XL LVL3 (GOWN DISPOSABLE) ×3 IMPLANT
GOWN STRL REUS W/TWL LRG LVL3 (GOWN DISPOSABLE) ×3
GOWN STRL REUS W/TWL XL LVL3 (GOWN DISPOSABLE) ×6
HANDPIECE INTERPULSE COAX TIP (DISPOSABLE) ×3
HOOD PEEL AWAY FACE SHEILD DIS (HOOD) ×9 IMPLANT
KIT BASIN OR (CUSTOM PROCEDURE TRAY) ×3 IMPLANT
KIT ROOM TURNOVER OR (KITS) ×3 IMPLANT
KNEE CAPITATED TOTAL 3 IMPLANT
MANIFOLD NEPTUNE II (INSTRUMENTS) ×3 IMPLANT
NDL 18GX1X1/2 (RX/OR ONLY) (NEEDLE) IMPLANT
NEEDLE 18GX1X1/2 (RX/OR ONLY) (NEEDLE) IMPLANT
NEEDLE 22X1 1/2 (OR ONLY) (NEEDLE) ×6 IMPLANT
NS IRRIG 1000ML POUR BTL (IV SOLUTION) ×3 IMPLANT
PACK TOTAL JOINT (CUSTOM PROCEDURE TRAY) ×3 IMPLANT
PAD ARMBOARD 7.5X6 YLW CONV (MISCELLANEOUS) ×6 IMPLANT
SET HNDPC FAN SPRY TIP SCT (DISPOSABLE) ×1 IMPLANT
STRIP CLOSURE SKIN 1/2X4 (GAUZE/BANDAGES/DRESSINGS) ×2 IMPLANT
SUCTION FRAZIER HANDLE 10FR (MISCELLANEOUS)
SUCTION TUBE FRAZIER 10FR DISP (MISCELLANEOUS) IMPLANT
SUT BONE WAX W31G (SUTURE) ×3 IMPLANT
SUT MNCRL AB 3-0 PS2 18 (SUTURE) ×3 IMPLANT
SUT VIC AB 0 CTB1 27 (SUTURE) ×3 IMPLANT
SUT VIC AB 1 CT1 27 (SUTURE) ×6
SUT VIC AB 1 CT1 27XBRD ANBCTR (SUTURE) ×2 IMPLANT
SUT VIC AB 2-0 CT1 27 (SUTURE) ×6
SUT VIC AB 2-0 CT1 TAPERPNT 27 (SUTURE) ×2 IMPLANT
SUT VLOC 180 0 24IN GS25 (SUTURE) ×3 IMPLANT
SYR 20CC LL (SYRINGE) ×6 IMPLANT
TOWEL OR 17X24 6PK STRL BLUE (TOWEL DISPOSABLE) ×3 IMPLANT
TOWEL OR 17X26 10 PK STRL BLUE (TOWEL DISPOSABLE) ×3 IMPLANT
TRAY FOLEY CATH SILVER 14FR (SET/KITS/TRAYS/PACK) ×2 IMPLANT
WRAP KNEE MAXI GEL POST OP (GAUZE/BANDAGES/DRESSINGS) ×3 IMPLANT

## 2017-08-14 NOTE — Progress Notes (Signed)
Orthopedic Tech Progress Note Patient Details:  Emma [name] 1941/04/20 [id number]  CPM Left Knee CPM Left Knee: On Left Knee Flexion (Degrees): 90 Left Knee Extension (Degrees): 0 Additional Comments: Foot roll  Post Interventions Patient Tolerated: Well Instructions Provided: Care of device, Adjustment of device  [place] Pink 08/14/2017, 1:25 PM

## 2017-08-14 NOTE — Evaluation (Signed)
Physical Therapy Evaluation Patient Details Name: Emma [name] MRN: [id number] DOB: [birth year] Today's Date: 08/14/2017   History of Present Illness  Pt s/p lt TKR. PMH - HTN, OA  Clinical Impression  Pt presents to PT with decr mobility as expected after Lt TKR. Expect pt will make good progress. Pt lives alone and she plans on going to [facility].     Follow Up Recommendations DC plan and follow up therapy as arranged by surgeon    Equipment Recommendations  Rolling walker with 5" wheels    Recommendations for Other Services       Precautions / Restrictions Precautions Precautions: Knee Restrictions Weight Bearing Restrictions: Yes LLE Weight Bearing: Weight bearing as tolerated      Mobility  Bed Mobility Overal bed mobility: Needs Assistance Bed Mobility: Supine to Sit     Supine to sit: Min assist     General bed mobility comments: Assist for LLE  Transfers Overall transfer level: Needs assistance Equipment used: Rolling walker (2 wheeled) Transfers: Sit to/from Stand Sit to Stand: Min assist         General transfer comment: Assist to bring hips up  Ambulation/Gait Ambulation/Gait assistance: Min guard Ambulation Distance (Feet): 150 Feet Assistive device: Rolling walker (2 wheeled) Gait Pattern/deviations: Step-through pattern;Decreased step length - right;Decreased step length - left;Decreased stride length;Antalgic Gait velocity: decr Gait velocity interpretation: Below normal speed for age/gender General Gait Details: Assist for safety. Verbal cues for technique  Stairs            Wheelchair Mobility    Modified Rankin (Stroke Patients Only)       Balance Overall balance assessment: Needs assistance Sitting-balance support: No upper extremity supported;Feet supported Sitting balance-Leahy Scale: Normal     Standing balance support: Bilateral upper extremity supported Standing balance-Leahy Scale: Poor Standing balance  comment: walker and min guard for static standing                             Pertinent Vitals/Pain Pain Assessment: 0-10 Pain Score: 8  Pain Location: lt knee Pain Descriptors / Indicators: Aching Pain Intervention(s): Premedicated before session    Home Living Family/patient expects to be discharged to:: Private residence Living Arrangements: Alone   Type of Home: House Home Access: Stairs to enter Entrance Stairs-Rails: None Technical brewer of Steps: 3 Home Layout: One level Home Equipment: None      Prior Function Level of Independence: Independent               Hand Dominance        Extremity/Trunk Assessment   Upper Extremity Assessment Upper Extremity Assessment: Overall WFL for tasks assessed    Lower Extremity Assessment Lower Extremity Assessment: LLE deficits/detail LLE Deficits / Details: fair quad set. Assist for straight leg raise. AAROM knee 0-90       Communication   Communication: No difficulties  Cognition Arousal/Alertness: Awake/alert Behavior During Therapy: WFL for tasks assessed/performed Overall Cognitive Status: Within Functional Limits for tasks assessed                                        General Comments      Exercises Total Joint Exercises Ankle Circles/Pumps: AROM;Both;5 reps;Supine Quad Sets: Strengthening;Both;5 reps;Supine Straight Leg Raises: AAROM;Left;5 reps;Supine Long Arc Quad: AAROM;Left;5 reps;Seated Knee Flexion: AROM;Left;5 reps;Seated Goniometric ROM: 0-90  Assessment/Plan    PT Assessment Patient needs continued PT services  PT Problem List Decreased strength;Decreased range of motion;Decreased activity tolerance;Decreased balance;Decreased mobility;Decreased knowledge of use of DME;Pain       PT Treatment Interventions DME instruction;Gait training;Functional mobility training;Therapeutic activities;Patient/family education;Therapeutic exercise;Balance training     PT Goals (Current goals can be found in the Care Plan section)  Acute Rehab PT Goals Patient Stated Goal: return to prior level of function PT Goal Formulation: With patient Time For Goal Achievement: 08/21/17 Potential to Achieve Goals: Good    Frequency 7X/week   Barriers to discharge Decreased caregiver support Lives alone    Co-evaluation               AM-PAC PT "6 Clicks" Daily Activity  Outcome Measure Difficulty turning over in bed (including adjusting bedclothes, sheets and blankets)?: Unable Difficulty moving from lying on back to sitting on the side of the bed? : Unable Difficulty sitting down on and standing up from a chair with arms (e.g., wheelchair, bedside commode, etc,.)?: Unable Help needed moving to and from a bed to chair (including a wheelchair)?: A Little Help needed walking in hospital room?: A Little Help needed climbing 3-5 steps with a railing? : A Little 6 Click Score: 12    End of Session Equipment Utilized During Treatment: Gait belt Activity Tolerance: Patient tolerated treatment well Patient left: in chair;with call bell/phone within reach;with nursing/sitter in room Nurse Communication: Mobility status PT Visit Diagnosis: Other abnormalities of gait and mobility (R26.89);Pain Pain - Right/Left: Left Pain - part of body: Knee    Time: 7017-7939 PT Time Calculation (min) (ACUTE ONLY): 27 min   Charges:   PT Evaluation $PT Eval Low Complexity: 1 Low PT Treatments $Gait Training: 8-22 mins   PT G CodesMarland Kitchen        [facility] PT [place] 08/14/2017, 7:47 PM

## 2017-08-14 NOTE — H&P (Signed)
Emma [name] MRN:  [id number] DOB/SEX:  1941-07-11/female  CHIEF COMPLAINT:  Painful left Knee  HISTORY: Patient is a 77 y.o. female presented with a history of pain in the left knee. Onset of symptoms was gradual starting a few years ago with gradually worsening course since that time. Patient has been treated conservatively with over-the-counter NSAIDs and activity modification. Patient currently rates pain in the knee at 10 out of 10 with activity. There is pain at night.  PAST MEDICAL HISTORY: Patient Active Problem List   Diagnosis Date Noted  . Acute bronchitis 04/02/2013  . COPD (chronic obstructive pulmonary disease) ([place]) 04/02/2013   Past Medical History:  Diagnosis Date  . Allergy   . Asthma    as child  . 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 pt. unsure of pathology  . Cataract   . Condyloma   . Constipation   . Hx of adenomatous colonic polyps   . Hypertension   . Hypothyroidism    2016- synthroid d/c'd   . Osteoarthritis    OA- knee- L knee, R thumb  . Osteopenia   . Thyroid disease   . Vitamin D deficiency    Past Surgical History:  Procedure Laterality Date  . BELPHAROPTOSIS REPAIR Bilateral 04/2015   vision correction  . BREAST BIOPSY     right   . CATARACT EXTRACTION, BILATERAL Bilateral 01/2014, 02/2014   /w IOL  . CESAREAN SECTION     x 2   . CHOLECYSTECTOMY    . EYE SURGERY    . GALLBLADDER SURGERY    . SHOULDER SURGERY  2006   right   . SHOULDER SURGERY Left 07/17/2013    bone spurs, and partial tear of rotator cuff  . TUBAL LIGATION       MEDICATIONS:   Medications Prior to Admission  Medication Sig Dispense Refill Last Dose  . acetaminophen (TYLENOL) 500 MG tablet Take 1,000 mg by mouth every 6 (six) hours as needed for mild pain or moderate pain.   08/13/2017 at Unknown time  . ADVAIR DISKUS 250-50 MCG/DOSE AEPB Inhale 1 puff into the lungs daily.    08/14/2017 at Unknown  time  . cholecalciferol (VITAMIN D) 1000 units tablet Take 1,000 Units by mouth daily.   Past Week at Unknown time  . famotidine (PEPCID) 20 MG tablet Take 1 tablet (20 mg total) by mouth 2 (two) times daily. 10 tablet 0 Past Month at Unknown time  . folic acid (FOLVITE) 1 MG tablet Take 1 mg by mouth daily.   Past Week at Unknown time  . gabapentin (NEURONTIN) 300 MG capsule Take 300 mg by mouth at bedtime.    08/13/2017 at Unknown time  . ibuprofen (ADVIL,MOTRIN) 200 MG tablet Take 2 tablets by mouth every 8 (eight) hours as needed for moderate pain.    Past Month at Unknown time  . indapamide (LOZOL) 2.5 MG tablet Take 5 mg by mouth daily.    08/13/2017 at Unknown time  . KLOR-CON M20 20 MEQ tablet Take 40 mEq by mouth 2 (two) times daily.    08/13/2017 at Unknown time  . methotrexate (RHEUMATREX) 2.5 MG tablet Take 2.5-5 mg by mouth once a week. Sunday   08/13/2017 at Unknown time  . Propylene Glycol (SYSTANE BALANCE OP) Place 1 drop into both eyes daily.   08/13/2017 at Unknown time  . triamcinolone cream (KENALOG) 0.1 % Apply  1 application topically 2 (two) times daily. 30 g 0 Past Month at Unknown time  . Vitamin D, Ergocalciferol, (DRISDOL) 50000 UNITS CAPS Take 50,000 Units by mouth every 7 (seven) days. Sunday   08/13/2017 at Unknown time  . montelukast (SINGULAIR) 10 MG tablet Take 10 mg by mouth at bedtime as needed. Once daily   More than a month at Unknown time  . predniSONE (DELTASONE) 50 MG tablet Take 1 tablet (50 mg total) by mouth daily. 5 tablet 0 More than a month at Unknown time  . PRESCRIPTION MEDICATION Apply 1 application topically 3 (three) times daily. Baclofen 2%; Doxipen 5%; Gabapentin 6%; Topiramate 2%; Pentoxifylline 3%  Lipoderm cream base   More than a month at Unknown time    ALLERGIES:   Allergies  Allergen Reactions  . Penicillins Hives, Rash and Other (See Comments)    PATIENT HAS HAD A PCN REACTION WITH IMMEDIATE RASH, FACIAL/TONGUE/THROAT SWELLING, SOB, OR  LIGHTHEADEDNESS WITH HYPOTENSION:  #  #  #  YES  #  #  #   Has patient had a PCN reaction causing severe rash involving mucus membranes or skin necrosis: no Has patient had a PCN reaction that required hospitalization: no Has patient had a PCN reaction occurring within the last 10 years: #  #  #  YES  #  #  #    . Moxifloxacin     UNSPECIFIED REACTION     REVIEW OF SYSTEMS:  A comprehensive review of systems was negative except for: Musculoskeletal: positive for arthralgias and bone pain   FAMILY HISTORY:   Family History  Problem Relation Age of Onset  . Heart disease Father   . Diabetes Paternal Grandfather   . Asthma Son   . Colon cancer Neg Hx     SOCIAL HISTORY:   Social History   Tobacco Use  . Smoking status: Current Some Day Smoker    Packs/day: 0.25    Years: 50.00    Pack years: 12.50    Types: Cigarettes  . Smokeless tobacco: Never Used  . Tobacco comment: pt. reports that she smokes 2 cig. / day but somedays none   Substance Use Topics  . Alcohol use: Yes    Alcohol/week: 6.0 oz    Types: 10 Cans of beer per week     EXAMINATION:  Vital signs in last 24 hours: Temp:  [97.8 F (36.6 C)] 97.8 F (36.6 C) (02/04 0825) Pulse Rate:  [85] 85 (02/04 0825) Resp:  [20] 20 (02/04 0825) BP: (173)/(55) 173/55 (02/04 0825) SpO2:  [100 %] 100 % (02/04 0825) Weight:  [66.8 kg (147 lb 3.2 oz)] 66.8 kg (147 lb 3.2 oz) (02/04 0825)  BP (!) 173/55   Pulse 85   Temp 97.8 F (36.6 C) (Oral)   Resp 20   Ht 4' 11.5" (1.511 m)   Wt 66.8 kg (147 lb 3.2 oz)   LMP 12/09/1997 (Approximate)   SpO2 100%   BMI 29.23 kg/m   General Appearance:    Alert, cooperative, no distress, appears stated age  Head:    Normocephalic, without obvious abnormality, atraumatic  Eyes:    PERRL, conjunctiva/corneas clear, EOM's intact, fundi    benign, both eyes  Ears:    Normal TM's and external ear canals, both ears  Nose:   Nares normal, septum midline, mucosa normal, no drainage     or sinus tenderness  Throat:   Lips, mucosa, and tongue normal; teeth and gums normal  Neck:   Supple, symmetrical, trachea midline, no adenopathy;    thyroid:  no enlargement/tenderness/nodules; no carotid   bruit or JVD  Back:     Symmetric, no curvature, ROM normal, no CVA tenderness  Lungs:     Clear to auscultation bilaterally, respirations unlabored  Chest Wall:    No tenderness or deformity   Heart:    Regular rate and rhythm, S1 and S2 normal, no murmur, rub   or gallop  Breast Exam:    No tenderness, masses, or nipple abnormality  Abdomen:     Soft, non-tender, bowel sounds active all four quadrants,    no masses, no organomegaly  Genitalia:    Normal female without lesion, discharge or tenderness  Rectal:    Normal tone, no masses or tenderness;   guaiac negative stool  Extremities:   Extremities normal, atraumatic, no cyanosis or edema  Pulses:   2+ and symmetric all extremities  Skin:   Skin color, texture, turgor normal, no rashes or lesions  Lymph nodes:   Cervical, supraclavicular, and axillary nodes normal  Neurologic:   CNII-XII intact, normal strength, sensation and reflexes    throughout    Musculoskeletal:  ROM 0-120, Ligaments intact,  Imaging Review Plain radiographs demonstrate severe degenerative joint disease of the left knee. The overall alignment is neutral. The bone quality appears to be good for age and reported activity level.  Assessment/Plan: Primary osteoarthritis, left knee   The patient history, physical examination and imaging studies are consistent with advanced degenerative joint disease of the left knee. The patient has failed conservative treatment.  The clearance notes were reviewed.  After discussion with the patient it was felt that Total Knee Replacement was indicated. The procedure,  risks, and benefits of total knee arthroplasty were presented and reviewed. The risks including but not limited to aseptic loosening, infection, blood clots,  vascular injury, stiffness, patella tracking problems complications among others were discussed. The patient acknowledged the explanation, agreed to proceed with the plan.  Emma [name] 08/14/2017, 9:10 AM

## 2017-08-14 NOTE — Transfer of Care (Signed)
Immediate Anesthesia Transfer of Care Note  Patient: Emma [name]  Procedure(s) Performed: LEFT TOTAL KNEE ARTHROPLASTY (Left Knee)  Patient Location: PACU  Anesthesia Type:MAC and Spinal  Level of Consciousness: awake, alert , patient cooperative and responds to stimulation  Airway & Oxygen Therapy: Patient Spontanous Breathing and Patient connected to face mask oxygen  Post-op Assessment: Report given to RN and Post -op Vital signs reviewed and stable  Post vital signs: Reviewed and stable  Last Vitals:  Vitals:   08/14/17 1035 08/14/17 1040  BP:  (!) 117/34  Pulse: (!) 38 77  Resp: 12 17  Temp:    SpO2: 100% 100%    Last Pain:  Vitals:   08/14/17 0835  TempSrc:   PainSc: 0-No pain      Patients Stated Pain Goal: 2 (19/37/90 2409)  Complications: No apparent anesthesia complications

## 2017-08-14 NOTE — Anesthesia Procedure Notes (Signed)
Anesthesia Regional Block: Adductor canal block   Pre-Anesthetic Checklist: ,, timeout performed, Correct Patient, Correct Site, Correct Laterality, Correct Procedure, Correct Position, site marked, Risks and benefits discussed,  Surgical consent,  Pre-op evaluation,  At surgeon's request and post-op pain management  Laterality: Left  Prep: chloraprep       Needles:  Injection technique: Single-shot  Needle Type: Echogenic Stimulator Needle     Needle Length: 9cm  Needle Gauge: 21     Additional Needles:   Narrative:  Start time: 08/14/2017 10:25 AM End time: 08/14/2017 10:33 AM Injection made incrementally with aspirations every 5 mL.  Performed by: Personally  Anesthesiologist: Lillia Abed, MD  Additional Notes: Monitors applied. Patient sedated. Sterile prep and drape,hand hygiene and sterile gloves were used. Relevant anatomy identified.Needle position confirmed.Local anesthetic injected incrementally after negative aspiration. Local anesthetic spread visualized around nerve(s). Vascular puncture avoided. No complications. Image printed for medical record.The patient tolerated the procedure well.    Lillia Abed MD

## 2017-08-14 NOTE — Anesthesia Preprocedure Evaluation (Addendum)
Anesthesia Evaluation  Patient identified by MRN, date of birth, ID band Patient awake    Reviewed: Allergy & Precautions, NPO status , Patient's Chart, lab work & pertinent test results  Airway Mallampati: II  TM Distance: >3 FB Neck ROM: Full    Dental  (+) Dental Advisory Given, Teeth Intact   Pulmonary COPD, Current Smoker,    Pulmonary exam normal        Cardiovascular hypertension, Pt. on medications Normal cardiovascular exam     Neuro/Psych    GI/Hepatic   Endo/Other    Renal/GU      Musculoskeletal   Abdominal   Peds  Hematology   Anesthesia Other Findings   Reproductive/Obstetrics                            Anesthesia Physical Anesthesia Plan  ASA: II  Anesthesia Plan: Spinal   Post-op Pain Management:  Regional for Post-op pain   Induction: Intravenous  PONV Risk Score and Plan: 1 and Ondansetron and Treatment may vary due to age or medical condition  Airway Management Planned: Simple Face Mask  Additional Equipment:   Intra-op Plan:   Post-operative Plan:   Informed Consent: I have reviewed the patients History and Physical, chart, labs and discussed the procedure including the risks, benefits and alternatives for the proposed anesthesia with the patient or authorized representative who has indicated his/her understanding and acceptance.     Plan Discussed with: CRNA and Surgeon  Anesthesia Plan Comments:         Anesthesia Quick Evaluation

## 2017-08-14 NOTE — Anesthesia Procedure Notes (Signed)
Procedure Name: MAC Date/Time: 08/14/2017 10:49 AM Performed by: Glynda Jaeger, CRNA Pre-anesthesia Checklist: Patient identified, Emergency Drugs available, Suction available, Timeout performed and Patient being monitored Patient Re-evaluated:Patient Re-evaluated prior to induction Oxygen Delivery Method: Simple face mask Preoxygenation: Pre-oxygenation with 100% oxygen Induction Type: IV induction Placement Confirmation: positive ETCO2

## 2017-08-14 NOTE — Anesthesia Procedure Notes (Signed)
Spinal  Patient location during procedure: OR Start time: 08/14/2017 10:54 AM End time: 08/14/2017 10:57 AM Staffing Anesthesiologist: Lillia Abed, MD Performed: anesthesiologist  Preanesthetic Checklist Completed: patient identified, surgical consent, pre-op evaluation, timeout performed, IV checked, risks and benefits discussed and monitors and equipment checked Spinal Block Patient position: sitting Prep: DuraPrep Patient monitoring: blood pressure, continuous pulse ox, cardiac monitor and heart rate Approach: right paramedian Location: L3-4 Injection technique: single-shot Needle Needle type: Pencan  Needle gauge: 24 G Needle length: 9 cm Needle insertion depth: 6 cm

## 2017-08-14 NOTE — Anesthesia Postprocedure Evaluation (Signed)
Anesthesia Post Note  Patient: Emma [name]  Procedure(s) Performed: LEFT TOTAL KNEE ARTHROPLASTY (Left Knee)     Patient location during evaluation: PACU Anesthesia Type: MAC Level of consciousness: oriented and awake and alert Pain management: pain level controlled Vital Signs Assessment: post-procedure vital signs reviewed and stable Respiratory status: spontaneous breathing, respiratory function stable and patient connected to nasal cannula oxygen Cardiovascular status: blood pressure returned to baseline and stable Postop Assessment: no headache, no backache and no apparent nausea or vomiting Anesthetic complications: no    Last Vitals:  Vitals:   08/14/17 1400 08/14/17 1415  BP: (!) 116/51 (!) 124/51  Pulse: 63 (!) 55  Resp: 13 11  Temp:    SpO2: 99% 100%    Last Pain:  Vitals:   08/14/17 1415  TempSrc:   PainSc: 0-No pain                 Tariah Transue DAVID

## 2017-08-15 ENCOUNTER — Encounter (HOSPITAL_COMMUNITY): Payer: Self-pay | Admitting: Orthopedic Surgery

## 2017-08-15 ENCOUNTER — Other Ambulatory Visit: Payer: Self-pay

## 2017-08-15 LAB — BASIC METABOLIC PANEL
Anion gap: 16 — ABNORMAL HIGH (ref 5–15)
BUN: 15 mg/dL (ref 6–20)
CHLORIDE: 100 mmol/L — AB (ref 101–111)
CO2: 23 mmol/L (ref 22–32)
Calcium: 9.3 mg/dL (ref 8.9–10.3)
Creatinine, Ser: 0.77 mg/dL (ref 0.44–1.00)
GFR calc non Af Amer: >60 mL/min (ref >60)
Glucose, Bld: 98 mg/dL (ref 65–99)
POTASSIUM: 3.3 mmol/L — AB (ref 3.5–5.1)
SODIUM: 139 mmol/L (ref 135–145)

## 2017-08-15 LAB — CBC
HEMATOCRIT: 36.6 % (ref 36.0–46.0)
HEMOGLOBIN: 12.3 g/dL (ref 12.0–15.0)
MCH: 30.1 pg (ref 26.0–34.0)
MCHC: 33.6 g/dL (ref 30.0–36.0)
MCV: 89.7 fL (ref 78.0–100.0)
Platelets: 325 10*3/uL (ref 150–400)
RBC: 4.08 MIL/uL (ref 3.87–5.11)
RDW: 14.1 % (ref 11.5–15.5)
WBC: 16 10*3/uL — ABNORMAL HIGH (ref 4.0–10.5)

## 2017-08-15 MED ORDER — METHOCARBAMOL 500 MG PO TABS: 500.0000 mg | ORAL_TABLET | Freq: Four times a day (QID) | ORAL | 0 refills | Status: DC | PRN

## 2017-08-15 MED ORDER — ASPIRIN 325 MG PO TBEC: 325.0000 mg | DELAYED_RELEASE_TABLET | Freq: Two times a day (BID) | ORAL | 0 refills | Status: DC

## 2017-08-15 MED ORDER — OXYCODONE HCL 10 MG PO TABS: 10.0000 mg | ORAL_TABLET | ORAL | 0 refills | Status: DC | PRN

## 2017-08-15 NOTE — Evaluation (Signed)
Occupational Therapy Evaluation Patient Details Name: Emma [name] MRN: [id number] DOB: [birth year] Today's Date: 08/15/2017    History of Present Illness Pt s/p lt TKR. PMH - HTN, OA   Clinical Impression   PTA, pt was living alone and independent for ADL, IADL, and functional mobility. She currently requires overall mod assist for LB ADL, set-up for seated grooming tasks, and min assist for toilet transfers. She presented to OT evaluation slightly confused with decreased awareness, attention, and memory. She was noted to perseverate on topics at times. Pt reports plan to D/C to SNF (at [facility]) post-acute D/C and feel this is an appropriate D/C recommendation. All further OT needs will be met at next venue of care and will defer further services to [facility]. Acute OT will sign off.     Follow Up Recommendations  DC plan and follow up therapy as arranged by surgeon    Equipment Recommendations  Other (comment)(defer to next venue of care)    Recommendations for Other Services       Precautions / Restrictions Precautions Precautions: Knee Restrictions Weight Bearing Restrictions: Yes LLE Weight Bearing: Weight bearing as tolerated      Mobility Bed Mobility Overal bed mobility: Needs Assistance Bed Mobility: Supine to Sit     Supine to sit: Min guard     General bed mobility comments: Min guard assist for safety.   Transfers Overall transfer level: Needs assistance Equipment used: Rolling walker (2 wheeled) Transfers: Sit to/from Stand Sit to Stand: Min assist         General transfer comment: Min assist for stability    Balance Overall balance assessment: Needs assistance Sitting-balance support: No upper extremity supported;Feet supported Sitting balance-Leahy Scale: Normal     Standing balance support: Bilateral upper extremity supported Standing balance-Leahy Scale: Poor Standing balance comment: walker and min guard for static standing                            ADL either performed or assessed with clinical judgement   ADL Overall ADL's : Needs assistance/impaired Eating/Feeding: Set up;Sitting   Grooming: Set up;Sitting   Upper Body Bathing: Set up;Sitting   Lower Body Bathing: Moderate assistance;Sit to/from stand   Upper Body Dressing : Set up;Sitting   Lower Body Dressing: Moderate assistance;Sit to/from stand   Toilet Transfer: Minimal assistance;Ambulation;RW   Toileting- Clothing Manipulation and Hygiene: Minimal assistance;Sit to/from stand       Functional mobility during ADLs: Minimal assistance;Rolling walker General ADL Comments: Pt with posterior LOB and decreased stability.      Vision Patient Visual Report: No change from baseline Vision Assessment?: No apparent visual deficits     Perception     Praxis      Pertinent Vitals/Pain Pain Assessment: Faces Faces Pain Scale: Hurts little more Pain Location: L knee  Pain Descriptors / Indicators: Aching Pain Intervention(s): Monitored during session;Repositioned     Hand Dominance     Extremity/Trunk Assessment Upper Extremity Assessment Upper Extremity Assessment: Overall WFL for tasks assessed   Lower Extremity Assessment Lower Extremity Assessment: LLE deficits/detail LLE Deficits / Details: Decreased strength and ROM as expected post-operatively.        Communication Communication Communication: No difficulties   Cognition Arousal/Alertness: Awake/alert Behavior During Therapy: WFL for tasks assessed/performed Overall Cognitive Status: No family/caregiver present to determine baseline cognitive functioning Area of Impairment: Attention;Memory;Awareness  Current Attention Level: Selective Memory: Decreased short-term memory     Awareness: Emergent   General Comments: Unsure of baseline but decreased memory and awareness noted.    General Comments       Exercises     Shoulder Instructions       Home Living Family/patient expects to be discharged to:: Skilled nursing facility Living Arrangements: Alone   Type of Home: House Home Access: Stairs to enter Entrance Stairs-Number of Steps: 3 Entrance Stairs-Rails: None Home Layout: One level     Bathroom Shower/Tub: Walk-in shower(jacuzzi tub that she does not use)         Home Equipment: None          Prior Functioning/Environment Level of Independence: Independent                 OT Problem List: Decreased strength;Decreased activity tolerance;Impaired balance (sitting and/or standing);Decreased range of motion;Decreased safety awareness;Decreased knowledge of use of DME or AE;Decreased knowledge of precautions;Decreased cognition;Pain      OT Treatment/Interventions:      OT Goals(Current goals can be found in the care plan section) Acute Rehab OT Goals Patient Stated Goal: return to prior level of function OT Goal Formulation: With patient Time For Goal Achievement: 08/29/17 Potential to Achieve Goals: Good  OT Frequency:     Barriers to D/C: Decreased caregiver support          Co-evaluation              AM-PAC PT "6 Clicks" Daily Activity     Outcome Measure Help from another person eating meals?: A Little Help from another person taking care of personal grooming?: A Little Help from another person toileting, which includes using toliet, bedpan, or urinal?: A Little Help from another person bathing (including washing, rinsing, drying)?: A Lot Help from another person to put on and taking off regular upper body clothing?: A Little Help from another person to put on and taking off regular lower body clothing?: A Lot 6 Click Score: 16   End of Session Equipment Utilized During Treatment: Rolling walker CPM Left Knee CPM Left Knee: On Left Knee Flexion (Degrees): 70 Left Knee Extension (Degrees): 0 Nurse Communication: Mobility status;Other (comment)(pt has questions related to her  medications)  Activity Tolerance: Patient tolerated treatment well Patient left: in chair;with call bell/phone within reach  OT Visit Diagnosis: Unsteadiness on feet (R26.81);Muscle weakness (generalized) (M62.81);Pain Pain - Right/Left: Left Pain - part of body: Knee                Time: 5537-4827 OT Time Calculation (min): 37 min Charges:  OT General Charges $OT Visit: 1 Visit OT Evaluation $OT Eval Moderate Complexity: 1 Mod OT Treatments $Self Care/Home Management : 8-22 mins G-Codes:     Norman Herrlich, MS OTR/L  Pager: [place] A Nylen Creque 08/15/2017, 10:43 AM

## 2017-08-15 NOTE — Op Note (Signed)
TOTAL KNEE REPLACEMENT OPERATIVE NOTE:  08/14/2017  3:00 PM  PATIENT:  Emma [name]  77 y.o. female  PRE-OPERATIVE DIAGNOSIS:  primary osteoarthritis left knee  POST-OPERATIVE DIAGNOSIS:  primary osteoarthritis left knee  PROCEDURE:  Procedure(s): LEFT TOTAL KNEE ARTHROPLASTY  SURGEON:  Surgeon(s): Vickey Huger, MD  PHYSICIAN ASSISTANT: Nehemiah Massed, [facility] ANESTHESIA:   spinal  DRAINS: Hemovac  SPECIMEN: None  COUNTS:  Correct  TOURNIQUET:   Total Tourniquet Time Documented: Thigh (Left) - 41 minutes Total: Thigh (Left) - 41 minutes   DICTATION:  Indication for procedure:    The patient is a 77 y.o. female who has failed conservative treatment for primary osteoarthritis left knee.  Informed consent was obtained prior to anesthesia. The risks versus benefits of the operation were explain and in a way the patient can, and did, understand.   On the implant demand matching protocol, this patient scored 10.  Therefore, this patient was not receive a polyethylene insert with vitamin E which is a high demand implant.  Description of procedure:     The patient was taken to the operating room and placed under anesthesia.  The patient was positioned in the usual fashion taking care that all body parts were adequately padded and/or protected.  I foley catheter was not placed.  A tourniquet was applied and the leg prepped and draped in the usual sterile fashion.  The extremity was exsanguinated with the esmarch and tourniquet inflated to 350 mmHg.  Pre-operative range of motion was normal.  The knee was in 5 degree of mild valgus.  A midline incision approximately 6-7 inches long was made with a #10 blade.  A new blade was used to make a parapatellar arthrotomy going 2-3 cm into the quadriceps tendon, over the patella, and alongside the medial aspect of the patellar tendon.  A synovectomy was then performed with the #10 blade and forceps. I then elevated the deep MCL off the medial  tibial metaphysis subperiosteally around to the semimembranosus attachment.    I everted the patella and used calipers to measure patellar thickness.  I used the reamer to ream down to appropriate thickness to recreate the native thickness.  I then removed excess bone with the rongeur and sagittal saw.  I used the appropriately sized template and drilled the three lug holes.  I then put the trial in place and measured the thickness with the calipers to ensure recreation of the native thickness.  The trial was then removed and the patella subluxed and the knee brought into flexion.  A homan retractor was place to retract and protect the patella and lateral structures.  A Z-retractor was place medially to protect the medial structures.  The extra-medullary alignment system was used to make cut the tibial articular surface perpendicular to the anamotic axis of the tibia and in 3 degrees of posterior slope.  The cut surface and alignment jig was removed.  I then used the intramedullary alignment guide to make a 4 valgus cut on the distal femur.  I then marked out the epicondylar axis on the distal femur.  The posterior condylar axis measured 3 degrees.  I then used the anterior referencing sizer and measured the femur to be a size 5.  The 4-In-1 cutting block was screwed into place in external rotation matching the posterior condylar angle, making our cuts perpendicular to the epicondylar axis.  Anterior, posterior and chamfer cuts were made with the sagittal saw.  The cutting block and cut pieces were  removed.  A lamina spreader was placed in 90 degrees of flexion.  The ACL, PCL, menisci, and posterior condylar osteophytes were removed.  A 10 mm spacer blocked was found to offer good flexion and extension gap balance after mild in degree releasing.   The scoop retractor was then placed and the femoral finishing block was pinned in place.  The small sagittal saw was used as well as the lug drill to finish the  femur.  The block and cut surfaces were removed and the medullary canal hole filled with autograft bone from the cut pieces.  The tibia was delivered forward in deep flexion and external rotation.  A size C tray was selected and pinned into place centered on the medial 1/3 of the tibial tubercle.  The reamer and keel was used to prepare the tibia through the tray.    I then trialed with the size 5 femur, size C tibia, a 10 mm insert and the 32 patella.  I had excellent flexion/extension gap balance, excellent patella tracking.  Flexion was full and beyond 120 degrees; extension was zero.  These components were chosen and the staff opened them to me on the back table while the knee was lavaged copiously and the cement mixed.  The soft tissue was infiltrated with 60cc of exparel 1.3% through a 21 gauge needle.  I cemented in the components and removed all excess cement.  The polyethylene tibial component was snapped into place and the knee placed in extension while cement was hardening.  The capsule was infilltrated with 30cc of .25% Marcaine with epinephrine.  A hemovac was place in the joint exiting superolaterally.  A pain pump was place superomedially superficial to the arthrotomy.  Once the cement was hard, the tourniquet was let down.  Hemostasis was obtained.  The arthrotomy was closed with figure-8 #1 vicryl sutures.  The deep soft tissues were closed with #0 vicryls and the subcuticular layer closed with a running #2-0 vicryl.  The skin was reapproximated and closed with skin staples.  The wound was dressed with xeroform, 4 x4's, 2 ABD sponges, a single layer of webril and a TED stocking.   The patient was then awakened, extubated, and taken to the recovery room in stable condition.  BLOOD LOSS:  300cc DRAINS: 1 hemovac, 1 pain catheter COMPLICATIONS:  None.  PLAN OF CARE: Admit to inpatient   PATIENT DISPOSITION:  PACU - hemodynamically stable.   Delay start of Pharmacological VTE agent  (>24hrs) due to surgical blood loss or risk of bleeding:  not applicable  Please fax a copy of this op note to my office at [phone] (please only include page 1 and 2 of the Case Information op note)

## 2017-08-15 NOTE — Progress Notes (Signed)
Physical Therapy Treatment Patient Details Name: Emma [name] MRN: [id number] DOB: December 20, 1940 Today's Date: 08/15/2017    History of Present Illness Pt s/p lt TKR. PMH - HTN, OA    PT Comments    Patient is making progress toward mobility goals. Gait limited due to fatigue and c/o dizziness.  Current plan remains appropriate.   Follow Up Recommendations  DC plan and follow up therapy as arranged by surgeon     Equipment Recommendations  Rolling walker with 5" wheels    Recommendations for Other Services       Precautions / Restrictions Precautions Precautions: Knee Precaution Comments: precautions reviewed with pt Restrictions Weight Bearing Restrictions: Yes LLE Weight Bearing: Weight bearing as tolerated    Mobility  Bed Mobility Overal bed mobility: Needs Assistance Bed Mobility: Supine to Sit     Supine to sit: Min guard     General bed mobility comments: pt OOB in chair upon arrival  Transfers Overall transfer level: Needs assistance Equipment used: Rolling walker (2 wheeled) Transfers: Sit to/from Stand Sit to Stand: Min assist         General transfer comment: assist to power up into standing; cues for safe hand placement  Ambulation/Gait Ambulation/Gait assistance: Min guard;Min assist Ambulation Distance (Feet): (172ft total with seated rest break due to fatigue/dizziness) Assistive device: Rolling walker (2 wheeled) Gait Pattern/deviations: Step-through pattern;Decreased step length - right;Decreased step length - left;Decreased stride length;Antalgic Gait velocity: decr   General Gait Details: cues for sequencing and increased stride length   Stairs            Wheelchair Mobility    Modified Rankin (Stroke Patients Only)       Balance Overall balance assessment: Needs assistance Sitting-balance support: No upper extremity supported;Feet supported Sitting balance-Leahy Scale: Normal     Standing balance support: Bilateral  upper extremity supported Standing balance-Leahy Scale: Poor Standing balance comment: walker and min guard for static standing                            Cognition Arousal/Alertness: Awake/alert Behavior During Therapy: WFL for tasks assessed/performed Overall Cognitive Status: No family/caregiver present to determine baseline cognitive functioning Area of Impairment: Attention;Memory;Awareness                   Current Attention Level: Selective Memory: Decreased short-term memory     Awareness: Emergent   General Comments: pt tends to perseverate       Exercises Total Joint Exercises Ankle Circles/Pumps: AROM;Both;10 reps Quad Sets: Both;AROM;10 reps Heel Slides: AAROM;Left;10 reps Hip ABduction/ADduction: AROM;Left;10 reps Straight Leg Raises: Left;AROM;10 reps Long Arc Quad: Left;Seated;AROM;10 reps    General Comments        Pertinent Vitals/Pain Pain Assessment: Faces Faces Pain Scale: Hurts little more Pain Location: lt knee Pain Descriptors / Indicators: Grimacing;Guarding;Sore Pain Intervention(s): Limited activity within patient's tolerance;Monitored during session;Premedicated before session;Repositioned    Home Living Family/patient expects to be discharged to:: Skilled nursing facility Living Arrangements: Alone   Type of Home: House Home Access: Stairs to enter Entrance Stairs-Rails: None Home Layout: One level Home Equipment: None      Prior Function Level of Independence: Independent          PT Goals (current goals can now be found in the care plan section) Acute Rehab PT Goals Patient Stated Goal: return to prior level of function PT Goal Formulation: With patient Time For Goal Achievement: 08/21/17  Potential to Achieve Goals: Good Progress towards PT goals: Progressing toward goals    Frequency    7X/week      PT Plan Current plan remains appropriate    Co-evaluation              AM-PAC PT "6  Clicks" Daily Activity  Outcome Measure  Difficulty turning over in bed (including adjusting bedclothes, sheets and blankets)?: Unable Difficulty moving from lying on back to sitting on the side of the bed? : Unable Difficulty sitting down on and standing up from a chair with arms (e.g., wheelchair, bedside commode, etc,.)?: Unable Help needed moving to and from a bed to chair (including a wheelchair)?: A Little Help needed walking in hospital room?: A Little Help needed climbing 3-5 steps with a railing? : A Little 6 Click Score: 12    End of Session Equipment Utilized During Treatment: Gait belt Activity Tolerance: Patient tolerated treatment well Patient left: in chair;with call bell/phone within reach Nurse Communication: Mobility status PT Visit Diagnosis: Other abnormalities of gait and mobility (R26.89);Pain Pain - Right/Left: Left Pain - part of body: Knee     Time: 1008-1040 PT Time Calculation (min) (ACUTE ONLY): 32 min  Charges:  $Gait Training: 8-22 mins $Therapeutic Exercise: 8-22 mins                    G Codes:       Earney Navy, PTA Pager: [phone]     Darliss Cheney 08/15/2017, 11:33 AM

## 2017-08-15 NOTE — Social Work (Signed)
CSW received call from SNF-[facility] advising that they received Insurance Auth for SNF placement.  CSW will f/u for disposition.  Elissa Hefty, LCSW Clinical Social Worker [phone]

## 2017-08-15 NOTE — Plan of Care (Signed)
  Safety: Ability to remain free from injury will improve 08/15/2017 1645 - Progressing by Governor Rooks, RN   Pain Managment: General experience of comfort will improve 08/15/2017 1645 - Progressing by Governor Rooks, RN

## 2017-08-15 NOTE — Clinical Social Work Note (Signed)
Clinical Social Work Assessment  Patient Details  Name: Emma [name] MRN: [id number] Date of Birth: 07-22-1940  Date of referral:  08/15/17               Reason for consult:  Facility Placement                Permission sought to share information with:    Permission granted to share information::  Yes, Verbal Permission Granted  Name::     did not provide information and indicated that she has advised family  Agency::  [facility]  Relationship::     Contact Information:     Housing/Transportation Living arrangements for the past 2 months:  Single Family Home Source of Information:  Patient Patient Interpreter Needed:  None Criminal Activity/Legal Involvement Pertinent to Current Situation/Hospitalization:  No - Comment as needed Significant Relationships:  Adult Children, Other Family Members Lives with:  Self Do you feel safe going back to the place where you live?  No Need for family participation in patient care:  No (Coment)  Care giving concerns:  Pt resides alone and has new impairment. Given her new impairment, patient will need short term rehab as she indicated that she resided alone and managed all ADL's independently. Pt agrees to SNF placement and has pre-arranged for placement at [facility].  CSW explained the SNF placement and process. CSW obtained permission to send offer to SNF. CSW will f/u and confirm SNF bed offer. CSW discussed transportation. Pt undecided if she will allow son to transport or agree to non emergency ambulance. Pt will decide.  CSW validated concerns.  Social Worker assessment / plan:  CSW will f/u for disposition.  Employment status:  Retired Nurse, adult PT Recommendations:  [place] / Referral to community resources:  [place]  Patient/Family's Response to care:  Patient appreciative of [place] meeting to discuss SNF options. Pt agreeable to SNF placement at  discharge.  Patient/Family's Understanding of and Emotional Response to Diagnosis, Current Treatment, and Prognosis:  Patient has good understanding of her impairment as she pre-arranged for SNF prior to hospitalization. Pt resides alone and will need support as none of her family available to assist. Pt pan is to complete short term rehab and return home thereafter. Pt desires and hopes she will improve and return to her independence. No issues or concerns identified. CSW will follow for disposition.  Emotional Assessment Appearance:    Attitude/Demeanor/Rapport:  (Cooperative) Affect (typically observed):  Accepting, Appropriate Orientation:  Oriented to Situation, Oriented to  Time, Oriented to Place, Oriented to Self Alcohol / Substance use:  Not Applicable Psych involvement (Current and /or in the community):  No (Comment)  Discharge Needs  Concerns to be addressed:  Discharge Planning Concerns Readmission within the last 30 days:  No Current discharge risk:  Dependent with Mobility, Physical Impairment Barriers to Discharge:  No Barriers Identified   Normajean Baxter, LCSW 08/15/2017, 11:49 AM

## 2017-08-15 NOTE — Progress Notes (Signed)
SPORTS MEDICINE AND JOINT REPLACEMENT  Lara Mulch, MD    Carlyon Shadow, PA-C [place], [place], [place]  [address]                             [phone]   PROGRESS NOTE  Subjective:  negative for Chest Pain  negative for Shortness of Breath  negative for Nausea/Vomiting   negative for Calf Pain  negative for Bowel Movement   Tolerating Diet: yes         Patient reports pain as 3 on 0-10 scale.    Objective: Vital signs in last 24 hours:    Patient Vitals for the past 24 hrs:  BP Temp Temp src Pulse Resp SpO2 Height Weight  08/15/17 0644 (!) 131/45 (!) 97.4 F (36.3 C) Oral 72 18 99 % - -  08/14/17 2357 (!) 122/48 97.7 F (36.5 C) Oral 74 17 98 % - -  08/14/17 2010 119/61 98 F (36.7 C) Oral 86 16 97 % - -  08/14/17 1701 (!) 146/52 (!) 97.5 F (36.4 C) Oral 75 14 100 % - -  08/14/17 1615 (!) 143/60 98.1 F (36.7 C) - 72 13 98 % - -  08/14/17 1600 - - - 66 13 100 % - -  08/14/17 1545 (!) 136/57 - - 63 15 99 % - -  08/14/17 1530 - - - 63 13 98 % - -  08/14/17 1515 111/87 - - 67 15 100 % - -  08/14/17 1500 - - - 73 18 100 % - -  08/14/17 1445 129/76 - - 63 14 100 % - -  08/14/17 1430 (!) 116/48 - - (!) 57 11 99 % - -  08/14/17 1415 (!) 124/51 - - (!) 55 11 100 % - -  08/14/17 1400 (!) 116/51 - - 63 13 99 % - -  08/14/17 1345 (!) 115/53 - - (!) 59 13 99 % - -  08/14/17 1330 (!) 109/53 - - 66 14 99 % - -  08/14/17 1315 (!) 99/45 - - 66 (!) 22 99 % - -  08/14/17 1300 101/63 - - 68 16 98 % - -  08/14/17 1245 (!) 98/56 (!) 96.8 F (36 C) - 78 14 97 % - -  08/14/17 1040 (!) 117/34 - - 77 17 100 % - -  08/14/17 1035 - - - (!) 38 12 100 % - -  08/14/17 0825 (!) 173/55 97.8 F (36.6 C) Oral 85 20 100 % 4' 11.5" (1.511 m) 66.8 kg (147 lb 3.2 oz)    @flow {1959:LAST@   Intake/Output from previous day:   02/04 0701 - 02/05 0700 In: 75 [P.O.:240; I.V.:1000] Out: 2460 [Urine:2450]   Intake/Output this shift:   No intake/output data recorded.   Intake/Output      02/04 0701 - 02/05 0700 02/05 0701 - 02/06 0700   P.O. 240    I.V. (mL/kg) 1000 (15)    Total Intake(mL/kg) 1240 (18.6)    Urine (mL/kg/hr) 2450    Blood 10    Total Output 2460    Net -1220            LABORATORY DATA: Recent Labs    08/08/17 1040 08/14/17 0843  WBC 7.2  --   HGB 13.3 13.6  HCT 41.0 40.0  PLT 362  --    Recent Labs    08/08/17 1040 08/14/17 0843 08/14/17 0857  NA 140 136  --   K 3.3* 6.5* 3.1*  CL 103  --   --   CO2 25  --   --   BUN 18  --   --   CREATININE 0.63  --   --   GLUCOSE 111* 98  --   CALCIUM 9.9  --   --    No results found for: INR, PROTIME  Examination:  General appearance: alert, cooperative and no distress Extremities: extremities normal, atraumatic, no cyanosis or edema  Wound Exam: clean, dry, intact   Drainage:  None: wound tissue dry  Motor Exam: Quadriceps and Hamstrings Intact  Sensory Exam: Superficial Peroneal, Deep Peroneal and Tibial normal   Assessment:    1 Day Post-Op  Procedure(s) (LRB): LEFT TOTAL KNEE ARTHROPLASTY (Left)  ADDITIONAL DIAGNOSIS:  Active Problems:   S/P total knee replacement     Plan: Physical Therapy as ordered Weight Bearing as Tolerated (WBAT)  DVT Prophylaxis:  Aspirin  DISCHARGE PLAN: Skilled Nursing Facility/Rehab  DISCHARGE NEEDS: HHPT   Patient doing well, expected D/C to SNF tomorrow         Donia Ast 08/15/2017, 7:25 AM

## 2017-08-15 NOTE — NC FL2 (Signed)
[place] LEVEL OF CARE SCREENING TOOL     IDENTIFICATION  Patient Name: Emma [name] Birthdate: 20-Apr-1941 Sex: female Admission Date (Current Location): 08/14/2017  [facility] and [place] Number:  Herbalist and Address:  [facility] [place], [place] [address] [place], [place] [address]      Provider Number: [id number]  Attending Physician Name and Address:  Vickey Huger, MD  Relative Name and Phone Number:  Kristalynn Coddington, son, [phone]    Current Level of Care: Hospital Recommended Level of Care: [place] Prior Approval Number:    Date Approved/Denied:   PASRR Number: [id number]  Discharge Plan: SNF    Current Diagnoses: Patient Active Problem List   Diagnosis Date Noted  . 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 Acute bronchitis 04/02/2013  . COPD (chronic obstructive pulmonary disease) ([place]) 04/02/2013    Orientation RESPIRATION BLADDER Height & Weight     Self, Situation, Place  Normal Continent Weight: 147 lb 3.2 oz (66.8 kg) Height:  4' 11.5" (151.1 cm)  BEHAVIORAL SYMPTOMS/MOOD NEUROLOGICAL BOWEL NUTRITION STATUS      Continent Diet(See DC Summary)  AMBULATORY STATUS COMMUNICATION OF NEEDS Skin   Limited Assist Verbally Surgical wounds                       Personal Care Assistance Level of Assistance  Bathing, Feeding, Dressing Bathing Assistance: Limited assistance Feeding assistance: Limited assistance Dressing Assistance: Limited assistance     Functional Limitations Info  Sight, Hearing, Speech Sight Info: Adequate Hearing Info: Adequate Speech Info: Adequate    SPECIAL CARE FACTORS FREQUENCY  OT (By licensed OT), PT (By licensed PT)     PT Frequency: 5x week OT Frequency: 5x week            Contractures      Additional Factors Info  Code Status, Allergies Code Status Info: Full Allergies Info: PENICILLINS, MOXIFLOXACIN            Current Medications  (08/15/2017):  This is the current hospital active medication list Current Facility-Administered Medications  Medication Dose Route Frequency Provider Last Rate Last Dose  . acetaminophen (TYLENOL) tablet 650 mg  650 mg Oral Q4H PRN Donia Ast, [place]       Or  . acetaminophen (TYLENOL) suppository 650 mg  650 mg Rectal Q4H PRN Donia Ast, [place]      . acetaminophen (TYLENOL) tablet 1,000 mg  1,000 mg Oral Q6H Donia Ast, [place]   1,000 mg at 08/15/17 2836  . alum & mag hydroxide-simeth (MAALOX/MYLANTA) 200-200-20 MG/5ML suspension 30 mL  30 mL Oral Q4H PRN Donia Ast, [place]      . aspirin EC tablet 325 mg  325 mg Oral BID Donia Ast, [place]   325 mg at 08/15/17 1010  . bisacodyl (DULCOLAX) EC tablet 5 mg  5 mg Oral Daily PRN Donia Ast, [place]      . diphenhydrAMINE (BENADRYL) 12.5 MG/5ML elixir 12.5-25 mg  12.5-25 mg Oral Q4H PRN Donia Ast, [place]      . docusate sodium (COLACE) capsule 100 mg  100 mg Oral BID Donia Ast, [place]   100 mg at 08/15/17 1016  . famotidine (PEPCID) tablet 20 mg  20 mg Oral BID Donia Ast, [place]   20 mg at 08/15/17 1015  . gabapentin (NEURONTIN) capsule 300 mg  300 mg Oral QHS Donia Ast, [place]  300 mg at 08/14/17 2216  . gabapentin (NEURONTIN) capsule 300 mg  300 mg Oral TID Donia Ast, [place]   300 mg at 08/15/17 1010  . HYDROcodone-acetaminophen (NORCO) 7.5-325 MG per tablet 1 tablet  1 tablet Oral Q6H Donia Ast, [place]   1 tablet at 08/15/17 7371  . HYDROmorphone (DILAUDID) injection 0.5 mg  0.5 mg Intravenous Q2H PRN Donia Ast, [place]      . indapamide (LOZOL) tablet 5 mg  5 mg Oral Daily Donia Ast, [place]   5 mg at 08/15/17 1010  . menthol-cetylpyridinium (CEPACOL) lozenge 3 mg  1 lozenge Oral PRN Donia Ast, [place]       Or  . phenol (CHLORASEPTIC) mouth spray 1 spray  1 spray Mouth/Throat PRN Donia Ast, [place]      . methocarbamol (ROBAXIN) tablet 500 mg  500 mg Oral Q6H  PRN Donia Ast, [place]       Or  . methocarbamol (ROBAXIN) 500 mg in dextrose 5 % 50 mL IVPB  500 mg Intravenous Q6H PRN Donia Ast, [place]      . Derrill Memo ON 08/20/2017] methotrexate (RHEUMATREX) tablet 2.5 mg  2.5 mg Oral Q Sun Robbins, [place], [place]      . metoCLOPramide [facility]) tablet 5-10 mg  5-10 mg Oral Q8H PRN Donia Ast, [place]       Or  . metoCLOPramide (REGLAN) injection 5-10 mg  5-10 mg Intravenous Q8H PRN Donia Ast, [place]      . mometasone-formoterol [facility]) 200-5 MCG/ACT inhaler 2 puff  2 puff Inhalation BID Donia Ast, [place]   2 puff at 08/15/17 0626  . ondansetron (ZOFRAN) tablet 4 mg  4 mg Oral Q6H PRN Donia Ast, [place]       Or  . ondansetron [facility]) injection 4 mg  4 mg Intravenous Q6H PRN Donia Ast, [place]      . oxyCODONE (Oxy IR/ROXICODONE) immediate release tablet 5 mg  5 mg Oral Q3H PRN Donia Ast, [place]      . potassium chloride SA (K-DUR,KLOR-CON) CR tablet 40 mEq  40 mEq Oral BID Donia Ast, [place]   40 mEq at 08/15/17 1010  . senna-docusate (Senokot-S) tablet 1 tablet  1 tablet Oral QHS PRN Donia Ast, [place]      . sodium phosphate (FLEET) 7-19 GM/118ML enema 1 enema  1 enema Rectal Once PRN Donia Ast, [place]      . zolpidem [facility]) tablet 5 mg  5 mg Oral QHS PRN Donia Ast, [place]         Discharge Medications: Please see discharge summary for a list of discharge medications.  Relevant Imaging Results:  Relevant Lab Results:   Additional Information SS#: [id number] [place], LCSW

## 2017-08-16 LAB — CBC
HEMATOCRIT: 35.3 % — AB (ref 36.0–46.0)
Hemoglobin: 11.7 g/dL — ABNORMAL LOW (ref 12.0–15.0)
MCH: 29.7 pg (ref 26.0–34.0)
MCHC: 33.1 g/dL (ref 30.0–36.0)
MCV: 89.6 fL (ref 78.0–100.0)
Platelets: 338 10*3/uL (ref 150–400)
RBC: 3.94 MIL/uL (ref 3.87–5.11)
RDW: 14.8 % (ref 11.5–15.5)
WBC: 14.8 10*3/uL — AB (ref 4.0–10.5)

## 2017-08-16 NOTE — Progress Notes (Signed)
Removed IV, called facility and gave report to [facility], Pt not in distress. Pt to discharge to SNF with belongings accompanied by son.

## 2017-08-16 NOTE — Plan of Care (Signed)
  Education: Knowledge of General Education information will improve 08/16/2017 0416 - Progressing by Anson Fret, RN Note POC reviewed with pt.

## 2017-08-16 NOTE — Progress Notes (Signed)
Physical Therapy Treatment Patient Details Name: Emma [name] MRN: [id number] DOB: 01-13-1941 Today's Date: 08/16/2017    History of Present Illness Pt s/p lt TKR. PMH - HTN, OA    PT Comments    Patient continues to make progress toward mobility goals. Continue to progress as tolerated with anticipated d/c to SNF for further skilled PT services.     Follow Up Recommendations  DC plan and follow up therapy as arranged by surgeon     Equipment Recommendations  Rolling walker with 5" wheels    Recommendations for Other Services       Precautions / Restrictions Precautions Precautions: Knee Precaution Comments: precautions reviewed with pt Restrictions Weight Bearing Restrictions: Yes LLE Weight Bearing: Weight bearing as tolerated    Mobility  Bed Mobility Overal bed mobility: Needs Assistance Bed Mobility: Supine to Sit     Supine to sit: Min guard     General bed mobility comments: min guard for safety  Transfers Overall transfer level: Needs assistance Equipment used: Rolling walker (2 wheeled) Transfers: Sit to/from Stand Sit to Stand: Min guard         General transfer comment: min guard for safety from EOB; cues for safe hand placement  Ambulation/Gait Ambulation/Gait assistance: Min guard;Min assist Ambulation Distance (Feet): 100 Feet Assistive device: Rolling walker (2 wheeled) Gait Pattern/deviations: Step-through pattern;Decreased step length - right;Decreased step length - left;Decreased stride length;Antalgic Gait velocity: decr   General Gait Details: cues for sequencing and posture; no c/o dizziness this session   Stairs            Wheelchair Mobility    Modified Rankin (Stroke Patients Only)       Balance Overall balance assessment: Needs assistance Sitting-balance support: No upper extremity supported;Feet supported Sitting balance-Leahy Scale: Normal     Standing balance support: Bilateral upper extremity  supported Standing balance-Leahy Scale: Poor                              Cognition Arousal/Alertness: Awake/alert Behavior During Therapy: WFL for tasks assessed/performed Overall Cognitive Status: No family/caregiver present to determine baseline cognitive functioning Area of Impairment: Attention;Memory;Awareness                   Current Attention Level: Selective Memory: Decreased short-term memory     Awareness: Emergent   General Comments: pt tends to perseverate       Exercises Total Joint Exercises Quad Sets: Both;AROM;10 reps Short Arc Quad: AROM;Left;10 reps Heel Slides: AAROM;Left;10 reps Hip ABduction/ADduction: AROM;Left;10 reps Long Arc Quad: Left;Seated;AROM;10 reps Goniometric ROM: 0-90    General Comments        Pertinent Vitals/Pain Pain Assessment: Faces Faces Pain Scale: Hurts little more Pain Location: L knee Pain Descriptors / Indicators: Grimacing;Guarding;Sore;Aching Pain Intervention(s): Limited activity within patient's tolerance;Monitored during session;Premedicated before session;Repositioned    Home Living                      Prior Function            PT Goals (current goals can now be found in the care plan section) Acute Rehab PT Goals Patient Stated Goal: return to prior level of function PT Goal Formulation: With patient Time For Goal Achievement: 08/21/17 Potential to Achieve Goals: Good Progress towards PT goals: Progressing toward goals    Frequency    7X/week      PT Plan Current plan  remains appropriate    Co-evaluation              AM-PAC PT "6 Clicks" Daily Activity  Outcome Measure  Difficulty turning over in bed (including adjusting bedclothes, sheets and blankets)?: A Little Difficulty moving from lying on back to sitting on the side of the bed? : A Lot Difficulty sitting down on and standing up from a chair with arms (e.g., wheelchair, bedside commode, etc,.)?:  Unable Help needed moving to and from a bed to chair (including a wheelchair)?: A Little Help needed walking in hospital room?: A Little Help needed climbing 3-5 steps with a railing? : A Little 6 Click Score: 15    End of Session Equipment Utilized During Treatment: Gait belt Activity Tolerance: Patient tolerated treatment well Patient left: in chair;with call bell/phone within reach Nurse Communication: Mobility status PT Visit Diagnosis: Other abnormalities of gait and mobility (R26.89);Pain Pain - Right/Left: Left Pain - part of body: Knee     Time: 0911-0938 PT Time Calculation (min) (ACUTE ONLY): 27 min  Charges:  $Gait Training: 8-22 mins $Therapeutic Exercise: 8-22 mins                    G Codes:       Earney Navy, PTA Pager: [phone]     Darliss Cheney 08/16/2017, 10:02 AM

## 2017-08-16 NOTE — Plan of Care (Signed)
  Activity: Risk for activity intolerance will decrease 08/16/2017 1016 - Progressing by Williams Che, RN   Nutrition: Adequate nutrition will be maintained 08/16/2017 1016 - Progressing by Williams Che, RN   Elimination: Will not experience complications related to bowel motility 08/16/2017 1016 - Progressing by Williams Che, RN   Pain Managment: General experience of comfort will improve 08/16/2017 1016 - Progressing by Williams Che, RN   Safety: Ability to remain free from injury will improve 08/16/2017 1016 - Progressing by Williams Che, RN

## 2017-08-16 NOTE — Social Work (Signed)
Clinical Social Worker facilitated patient discharge including contacting patient family and facility to confirm patient discharge plans.  Clinical information faxed to facility and family agreeable with plan.    CSW arranged ambulance transport via [facility] to [facility].    RN to call [phone] to give report prior to discharge. Pt going to Room 206.  Clinical Social Worker will sign off for now as social work intervention is no longer needed. Please consult [place] again if new need arises.  Elissa Hefty, LCSW Clinical Social Worker [phone]

## 2017-08-16 NOTE — Discharge Summary (Signed)
SPORTS MEDICINE & JOINT REPLACEMENT   Emma Mulch, MD   Emma Shadow, PA-C [place], [place], [place]  [address]                             [phone]  PATIENT ID: Emma [name]        MRN:  [id number]          DOB/AGE: 07-23-1940 / 77 y.o.    DISCHARGE SUMMARY  ADMISSION DATE:    08/14/2017 DISCHARGE DATE:   08/16/2017   ADMISSION DIAGNOSIS: primary osteoarthritis left knee    DISCHARGE DIAGNOSIS:  primary osteoarthritis left knee    ADDITIONAL DIAGNOSIS: Active Problems:   S/P total knee replacement  Past Medical History:  Diagnosis Date  . Allergy   . Asthma    as child  . 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 pt. unsure of pathology  . Cataract   . Condyloma   . Constipation   . Hx of adenomatous colonic polyps   . Hypertension   . Hypothyroidism    2016- synthroid d/c'd   . Osteoarthritis    OA- knee- L knee, R thumb  . Osteopenia   . Thyroid disease   . Vitamin D deficiency     PROCEDURE: Procedure(s): LEFT TOTAL KNEE ARTHROPLASTY on 08/14/2017  CONSULTS:    HISTORY:  See H&P in chart  HOSPITAL COURSE:  BALEIGH [name] is a 77 y.o. admitted on 08/14/2017 and found to have a diagnosis of primary osteoarthritis left knee.  After appropriate laboratory studies were obtained  they were taken to the operating room on 08/14/2017 and underwent Procedure(s): LEFT TOTAL KNEE ARTHROPLASTY.   They were given perioperative antibiotics:  Anti-infectives (From admission, onward)   Start     Dose/Rate Route Frequency Ordered Stop   08/14/17 1715  clindamycin (CLEOCIN) IVPB 600 mg     600 mg 100 mL/hr over 30 Minutes Intravenous Every 6 hours 08/14/17 1704 08/14/17 2355   08/14/17 0830  clindamycin (CLEOCIN) IVPB 900 mg     900 mg 100 mL/hr over 30 Minutes Intravenous To ShortStay Surgical 08/11/17 1147 08/14/17 1105    .  Patient given tranexamic acid IV or topical and exparel  intra-operatively.  Tolerated the procedure well.    POD# 1: Vital signs were stable.  Patient denied Chest pain, shortness of breath, or calf pain.  Patient was started on Lovenox 30 mg subcutaneously twice daily at 8am.  Consults to PT, OT, and care management were made.  The patient was weight bearing as tolerated.  CPM was placed on the operative leg 0-90 degrees for 6-8 hours a day. When out of the CPM, patient was placed in the foam block to achieve full extension. Incentive spirometry was taught.  Dressing was changed.       POD #2, Continued  PT for ambulation and exercise program.  IV saline locked.  O2 discontinued.    The remainder of the hospital course was dedicated to ambulation and strengthening.   The patient was discharged on 2 Days Post-Op in  Good condition.  Blood products given:none  DIAGNOSTIC STUDIES: Recent vital signs:  Patient Vitals for the past 24 hrs:  BP Temp Temp src Pulse Resp SpO2  08/16/17 0749 - - - - - 97 %  08/16/17 0500 (!) 149/61 97.8 F (36.6 C) Oral  89 16 97 %  08/15/17 2009 (!) 141/50 98 F (36.7 C) Oral 91 18 99 %  08/15/17 1348 (!) 135/50 97.6 F (36.4 C) Oral 82 18 98 %       Recent laboratory studies: Recent Labs    08/14/17 0843 08/15/17 0603 08/16/17 0630  WBC  --  16.0* 14.8*  HGB 13.6 12.3 11.7*  HCT 40.0 36.6 35.3*  PLT  --  325 338   Recent Labs    08/14/17 0843 08/14/17 0857 08/15/17 0603  NA 136  --  139  K 6.5* 3.1* 3.3*  CL  --   --  100*  CO2  --   --  23  BUN  --   --  15  CREATININE  --   --  0.77  GLUCOSE 98  --  98  CALCIUM  --   --  9.3   No results found for: INR, PROTIME   Recent Radiographic Studies :  No results found.  DISCHARGE INSTRUCTIONS: Discharge Instructions    CPM   Complete by:  As directed    Continuous passive motion machine (CPM):      Use the CPM from 0 to 90 for 4-6 hours per day.      You may increase by 10 per day.  You may break it up into 2 or 3 sessions per day.       Use CPM for 2 weeks or until you are told to stop.   Call MD / Call 911   Complete by:  As directed    If you experience chest pain or shortness of breath, CALL 911 and be transported to the hospital emergency room.  If you develope a fever above 101 F, pus (white drainage) or increased drainage or redness at the wound, or calf pain, call your surgeon's office.   Constipation Prevention   Complete by:  As directed    Drink plenty of fluids.  Prune juice may be helpful.  You may use a stool softener, such as Colace (over the counter) 100 mg twice a day.  Use MiraLax (over the counter) for constipation as needed.   Diet - low sodium heart healthy   Complete by:  As directed    Discharge instructions   Complete by:  As directed    INSTRUCTIONS AFTER JOINT REPLACEMENT   Remove items at home which could result in a fall. This includes throw rugs or furniture in walking pathways ICE to the affected joint every three hours while awake for 30 minutes at a time, for at least the first 3-5 days, and then as needed for pain and swelling.  Continue to use ice for pain and swelling. You may notice swelling that will progress down to the foot and ankle.  This is normal after surgery.  Elevate your leg when you are not up walking on it.   Continue to use the breathing machine you got in the hospital (incentive spirometer) which will help keep your temperature down.  It is common for your temperature to cycle up and down following surgery, especially at night when you are not up moving around and exerting yourself.  The breathing machine keeps your lungs expanded and your temperature down.   DIET:  As you were doing prior to hospitalization, we recommend a well-balanced diet.  DRESSING / WOUND CARE / SHOWERING  Keep the surgical dressing until follow up.  The dressing is water proof, so you can shower without any  extra covering.  IF THE DRESSING FALLS OFF or the wound gets wet inside, change the dressing with  sterile gauze.  Please use good hand washing techniques before changing the dressing.  Do not use any lotions or creams on the incision until instructed by your surgeon.    ACTIVITY  Increase activity slowly as tolerated, but follow the weight bearing instructions below.   No driving for 6 weeks or until further direction given by your physician.  You cannot drive while taking narcotics.  No lifting or carrying greater than 10 lbs. until further directed by your surgeon. Avoid periods of inactivity such as sitting longer than an hour when not asleep. This helps prevent blood clots.  You may return to work once you are authorized by your doctor.     WEIGHT BEARING   Weight bearing as tolerated with assist device (walker, cane, etc) as directed, use it as long as suggested by your surgeon or therapist, typically at least 4-6 weeks.   EXERCISES  Results after joint replacement surgery are often greatly improved when you follow the exercise, range of motion and muscle strengthening exercises prescribed by your doctor. Safety measures are also important to protect the joint from further injury. Any time any of these exercises cause you to have increased pain or swelling, decrease what you are doing until you are comfortable again and then slowly increase them. If you have problems or questions, call your caregiver or physical therapist for advice.   Rehabilitation is important following a joint replacement. After just a few days of immobilization, the muscles of the leg can become weakened and shrink (atrophy).  These exercises are designed to build up the tone and strength of the thigh and leg muscles and to improve motion. Often times heat used for twenty to thirty minutes before working out will loosen up your tissues and help with improving the range of motion but do not use heat for the first two weeks following surgery (sometimes heat can increase post-operative swelling).   These exercises  can be done on a training (exercise) mat, on the floor, on a table or on a bed. Use whatever works the best and is most comfortable for you.    Use music or television while you are exercising so that the exercises are a pleasant break in your day. This will make your life better with the exercises acting as a break in your routine that you can look forward to.   Perform all exercises about fifteen times, three times per day or as directed.  You should exercise both the operative leg and the other leg as well.   Exercises include:   Quad Sets - Tighten up the muscle on the front of the thigh (Quad) and hold for 5-10 seconds.   Straight Leg Raises - With your knee straight (if you were given a brace, keep it on), lift the leg to 60 degrees, hold for 3 seconds, and slowly lower the leg.  Perform this exercise against resistance later as your leg gets stronger.  Leg Slides: Lying on your back, slowly slide your foot toward your buttocks, bending your knee up off the floor (only go as far as is comfortable). Then slowly slide your foot back down until your leg is flat on the floor again.  Angel Wings: Lying on your back spread your legs to the side as far apart as you can without causing discomfort.  Hamstring Strength:  Lying on your back, push your  heel against the floor with your leg straight by tightening up the muscles of your buttocks.  Repeat, but this time bend your knee to a comfortable angle, and push your heel against the floor.  You may put a pillow under the heel to make it more comfortable if necessary.   A rehabilitation program following joint replacement surgery can speed recovery and prevent re-injury in the future due to weakened muscles. Contact your doctor or a physical therapist for more information on knee rehabilitation.    CONSTIPATION  Constipation is defined medically as fewer than three stools per week and severe constipation as less than one stool per week.  Even if you have a  regular bowel pattern at home, your normal regimen is likely to be disrupted due to multiple reasons following surgery.  Combination of anesthesia, postoperative narcotics, change in appetite and fluid intake all can affect your bowels.   YOU MUST use at least one of the following options; they are listed in order of increasing strength to get the job done.  They are all available over the counter, and you may need to use some, POSSIBLY even all of these options:    Drink plenty of fluids (prune juice may be helpful) and high fiber foods Colace 100 mg by mouth twice a day  Senokot for constipation as directed and as needed Dulcolax (bisacodyl), take with full glass of water  Miralax (polyethylene glycol) once or twice a day as needed.  If you have tried all these things and are unable to have a bowel movement in the first 3-4 days after surgery call either your surgeon or your primary doctor.    If you experience loose stools or diarrhea, hold the medications until you stool forms back up.  If your symptoms do not get better within 1 week or if they get worse, check with your doctor.  If you experience "the worst abdominal pain ever" or develop nausea or vomiting, please contact the office immediately for further recommendations for treatment.   ITCHING:  If you experience itching with your medications, try taking only a single pain pill, or even half a pain pill at a time.  You can also use Benadryl over the counter for itching or also to help with sleep.   TED HOSE STOCKINGS:  Use stockings on both legs until for at least 2 weeks or as directed by physician office. They may be removed at night for sleeping.  MEDICATIONS:  See your medication summary on the "After Visit Summary" that nursing will review with you.  You may have some home medications which will be placed on hold until you complete the course of blood thinner medication.  It is important for you to complete the blood thinner  medication as prescribed.  PRECAUTIONS:  If you experience chest pain or shortness of breath - call 911 immediately for transfer to the hospital emergency department.   If you develop a fever greater that 101 F, purulent drainage from wound, increased redness or drainage from wound, foul odor from the wound/dressing, or calf pain - CONTACT YOUR SURGEON.                                                   FOLLOW-UP APPOINTMENTS:  If you do not already have a post-op appointment, please call the office for  an appointment to be seen by your surgeon.  Guidelines for how soon to be seen are listed in your "After Visit Summary", but are typically between 1-4 weeks after surgery.  OTHER INSTRUCTIONS:   Knee Replacement:  Do not place pillow under knee, focus on keeping the knee straight while resting. CPM instructions: 0-90 degrees, 2 hours in the morning, 2 hours in the afternoon, and 2 hours in the evening. Place foam block, curve side up under heel at all times except when in CPM or when walking.  DO NOT modify, tear, cut, or change the foam block in any way.  MAKE SURE YOU:  Understand these instructions.  Get help right away if you are not doing well or get worse.    Thank you for letting us be a part of your medical care team.  It is a privilege we respect greatly.  We hope these instructions will help you stay on track for a fast and full recovery!   Increase activity slowly as tolerated   Complete by:  As directed       DISCHARGE MEDICATIONS:   Allergies as of 08/16/2017      Reactions   Penicillins Hives, Rash, Other (See Comments)   PATIENT HAS HAD A PCN REACTION WITH IMMEDIATE RASH, FACIAL/TONGUE/THROAT SWELLING, SOB, OR LIGHTHEADEDNESS WITH HYPOTENSION:  #  #  #  YES  #  #  #   Has patient had a PCN reaction causing severe rash involving mucus membranes or skin necrosis: no Has patient had a PCN reaction that required hospitalization: no Has patient had a PCN reaction occurring within  the last 10 years: #  #  #  YES  #  #  #    Moxifloxacin    UNSPECIFIED REACTION       Medication List    STOP taking these medications   ibuprofen 200 MG tablet Commonly known as:  ADVIL,MOTRIN   predniSONE 50 MG tablet Commonly known as:  DELTASONE   PRESCRIPTION MEDICATION   triamcinolone cream 0.1 % Commonly known as:  KENALOG     TAKE these medications   acetaminophen 500 MG tablet Commonly known as:  TYLENOL Take 1,000 mg by mouth every 6 (six) hours as needed for mild pain or moderate pain.   ADVAIR DISKUS 250-50 MCG/DOSE Aepb Generic drug:  Fluticasone-Salmeterol Inhale 1 puff into the lungs daily.   aspirin 325 MG EC tablet Take 1 tablet (325 mg total) by mouth 2 (two) times daily.   cholecalciferol 1000 units tablet Commonly known as:  VITAMIN D Take 1,000 Units by mouth daily.   famotidine 20 MG tablet Commonly known as:  PEPCID Take 1 tablet (20 mg total) by mouth 2 (two) times daily.   folic acid 1 MG tablet Commonly known as:  FOLVITE Take 1 mg by mouth daily.   gabapentin 300 MG capsule Commonly known as:  NEURONTIN Take 300 mg by mouth at bedtime.   indapamide 2.5 MG tablet Commonly known as:  LOZOL Take 5 mg by mouth daily.   KLOR-CON M20 20 MEQ tablet Generic drug:  potassium chloride SA Take 40 mEq by mouth 2 (two) times daily.   methocarbamol 500 MG tablet Commonly known as:  ROBAXIN Take 1-2 tablets (500-1,000 mg total) by mouth every 6 (six) hours as needed for muscle spasms.   methotrexate 2.5 MG tablet Commonly known as:  RHEUMATREX Take 2.5-5 mg by mouth once a week. Sunday   montelukast 10 MG tablet Commonly  known as:  SINGULAIR Take 10 mg by mouth at bedtime as needed. Once daily   Oxycodone HCl 10 MG Tabs Take 1 tablet (10 mg total) by mouth every 4 (four) hours as needed for moderate pain ((score 4 to 6)).   SYSTANE BALANCE OP Place 1 drop into both eyes daily.   Vitamin D (Ergocalciferol) 50000 units Caps  capsule Commonly known as:  DRISDOL Take 50,000 Units by mouth every 7 (seven) days. Sunday            Durable Medical Equipment  (From admission, onward)        Start     Ordered   08/14/17 1705  DME Walker rolling  Once    Question:  Patient needs a walker to treat with the following condition  Answer:  S/P total knee replacement   08/14/17 1704   08/14/17 1705  DME 3 n 1  Once     02 /04/19 1704   08/14/17 1705  DME Bedside commode  Once    Question:  Patient needs a bedside commode to treat with the following condition  Answer:  S/P total knee replacement   08/14/17 1704      FOLLOW UP VISIT:   Contact information for after-discharge care    Destination    [facility] .   Service:  Skilled Nursing Contact information: [address] [place] [address] [phone]              DISPOSITION: HOME VS. SNF  CONDITION:  Keenan Bachelor 08/16/2017, 10:26 AM

## 2017-08-16 NOTE — Progress Notes (Signed)
All questions and concerns addressed, Pt discharged accompanied by son.

## 2017-08-16 NOTE — Clinical Social Work Placement (Addendum)
   CLINICAL SOCIAL WORK PLACEMENT  NOTE  Date:  08/16/2017  Patient Details  Name: Emma [name] MRN: [id number] Date of Birth: 01-21-1941  Clinical Social Work is seeking post-discharge placement for this patient at the [place] level of care (*CSW will initial, date and re-position this form in  chart as items are completed):  Yes   Patient/family provided with [facility] Work Department's list of facilities offering this level of care within the geographic area requested by the patient (or if unable, by the patient's family).  Yes   Patient/family informed of their freedom to choose among providers that offer the needed level of care, that participate in Medicare, Medicaid or managed care program needed by the patient, have an available bed and are willing to accept the patient.  Yes   Patient/family informed of [facility]'s ownership interest in [facility] and [facility], as well as of the fact that they are under no obligation to receive care at these facilities.  PASRR submitted to EDS on       PASRR number received on 08/15/17     Existing PASRR number confirmed on       FL2 transmitted to all facilities in geographic area requested by pt/family on 08/15/17     FL2 transmitted to all facilities within larger geographic area on       Patient informed that his/her managed care company has contracts with or will negotiate with certain facilities, including the following:        Yes   Patient/family informed of bed offers received.  Patient chooses bed at [facility], [place]     Physician recommends and patient chooses bed at      Patient to be transferred to [facility], [place] on 08/16/17.  Patient to be transferred to facility by son.     Patient family notified on 08/16/17 of transfer.  Name of family member notified:  pt responsible for self   & son at bedside  PHYSICIAN       Additional Comment:     _______________________________________________ Normajean Baxter, LCSW 08/16/2017, 10:30 AM

## 2017-08-25 ENCOUNTER — Ambulatory Visit: Payer: Medicare Other | Admitting: Physical Therapy

## 2017-08-29 ENCOUNTER — Encounter: Payer: Self-pay | Admitting: Physical Therapy

## 2017-08-29 ENCOUNTER — Ambulatory Visit: Payer: Medicare Other | Attending: Orthopedic Surgery | Admitting: Physical Therapy

## 2017-08-29 DIAGNOSIS — G8918 Other acute postprocedural pain: Secondary | ICD-10-CM | POA: Insufficient documentation

## 2017-08-29 DIAGNOSIS — M6281 Muscle weakness (generalized): Secondary | ICD-10-CM | POA: Diagnosis present

## 2017-08-29 DIAGNOSIS — M25662 Stiffness of left knee, not elsewhere classified: Secondary | ICD-10-CM | POA: Diagnosis present

## 2017-08-29 DIAGNOSIS — R262 Difficulty in walking, not elsewhere classified: Secondary | ICD-10-CM | POA: Insufficient documentation

## 2017-08-29 DIAGNOSIS — R600 Localized edema: Secondary | ICD-10-CM | POA: Diagnosis present

## 2017-08-29 DIAGNOSIS — M25562 Pain in left knee: Secondary | ICD-10-CM | POA: Insufficient documentation

## 2017-08-29 NOTE — Therapy (Signed)
During this treatment session, the therapist was present, participating in and directing the treatment. Emma [name] [place], [name], [address] Phone: [phone]   Fax:  [phone]  Physical Therapy Evaluation  Patient Details  Name: Emma [name] MRN: [id number] Date of Birth: [birth year] Referring Provider: Ronnie Derby   Encounter Date: 08/29/2017  PT End of Session - 08/29/17 1449    Visit Number  1    Date for PT Re-Evaluation  10/27/17    PT Start Time  1352    PT Stop Time  1447    PT Time Calculation (min)  55 min    Activity Tolerance  Patient tolerated treatment well    Behavior During Therapy  [facility] for tasks assessed/performed       Past Medical History:  Diagnosis Date  . Allergy   . Asthma    as child  . 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 pt. unsure of pathology  . Cataract   . Condyloma   . Constipation   . Hx of adenomatous colonic polyps   . Hypertension   . Hypothyroidism    2016- synthroid d/c'd   . Osteoarthritis    OA- knee- L knee, R thumb  . Osteopenia   . Thyroid disease   . Vitamin D deficiency     Past Surgical History:  Procedure Laterality Date  . BELPHAROPTOSIS REPAIR Bilateral 04/2015   vision correction  . BREAST BIOPSY     right   . CATARACT EXTRACTION, BILATERAL Bilateral 01/2014, 02/2014   /w IOL  . CESAREAN SECTION     x 2   . CHOLECYSTECTOMY    . EYE SURGERY    . GALLBLADDER SURGERY    . SHOULDER SURGERY  2006   right   . SHOULDER SURGERY Left 07/17/2013    bone spurs, and partial tear of rotator cuff  . TOTAL KNEE ARTHROPLASTY Left 08/14/2017   Procedure: LEFT TOTAL KNEE ARTHROPLASTY;  Surgeon: Vickey Huger, MD;  Location: [place];  Service: Orthopedics;  Laterality: Left;  . TUBAL LIGATION      There were no vitals filed for this visit.   Subjective Assessment - 08/29/17 1353     Subjective  Pt. reports L TKR on 08/14/17. Pt. started having knee pain and taking IBU but increased over time to where it wasn't tolerable anymore. Pt. reports going to [facility] short-term in-patient rehab a little over a week. Pt. reports using walker to get around but uses SPC to go up and down stairs and always has someone with her when doing steps. Pt. reports never having to use an AD for gait prior to surgery.    Limitations  Standing;Walking    Patient Stated Goals  decrease pain for functional activity and walking, get back to water aerobics    Currently in Pain?  Yes    Pain Score  5     Pain Location  Knee    Pain Orientation  Left    Pain Descriptors / Indicators  Aching    Pain Type  Acute pain    Pain Onset  1 to 4 weeks ago    Pain Frequency  Constant    Aggravating Factors   standing, walking, having to get up from sitting     Pain Relieving Factors  ice and rest decreases 3/10    Effect  of Pain on Daily Activities  ADLs         [facility] PT Assessment - 08/29/17 0001      Assessment   Medical Diagnosis  L TKR     Referring Provider  Lucey    Onset Date/Surgical Date  08/14/17    Prior Therapy  at hospital and short-term in-patient at [facility]      Precautions   Precautions  None      Restrictions   Weight Bearing Restrictions  No      Balance Screen   Has the patient fallen in the past 6 months  No    Has the patient had a decrease in activity level because of a fear of falling?   No    Is the patient reluctant to leave their home because of a fear of falling?   No      Home Environment   Additional Comments  3-5 steps to get into the house, step to get into the shower (about 8" ht)      Prior Function   Level of Independence  Independent    Vocation  Retired    Leisure  water aerobics      Observation/Other Assessments-Edema    Edema  Circumferential      Circumferential Edema   Circumferential - Right  41 cm    Circumferential - Left   43 cm      ROM /  Strength   AROM / PROM / Strength  AROM;PROM;Strength      AROM   AROM Assessment Site  Knee    Right/Left Knee  Left    Left Knee Extension  26 from neutral    Left Knee Flexion  94      PROM   PROM Assessment Site  Knee    Right/Left Knee  Left    Left Knee Extension  14 from neutral    Left Knee Flexion  104 increase in pain      Strength   Strength Assessment Site  Knee    Right/Left Knee  Left    Left Knee Flexion  3+/5    Left Knee Extension  3/5 too painful aginst resistance      Palpation   Palpation comment  scar mobility is good with no pain, warm to touch, noticable swelling, pitting edema in L ankle about 1cm in depth and slow to rebound      Ambulation/Gait   Gait Comments  pt. AMB with walker slightly antalgic gait on left      Standardized Balance Assessment   Standardized Balance Assessment  Timed Up and Go Test      Timed Up and Go Test   Normal TUG (seconds)  24             Objective measurements completed on examination: See above findings.      [facility] Adult PT Treatment/Exercise - 08/29/17 0001      Exercises   Exercises  Knee/Hip      Knee/Hip Exercises: Aerobic   Nustep  L2 x 5 mins      Knee/Hip Exercises: Supine   Short Arc Quad Sets  Left;10 reps      Modalities   Modalities  Cryotherapy;Vasopneumatic      Cryotherapy   Number Minutes Cryotherapy  10 Minutes    Cryotherapy Location  Knee    Type of Cryotherapy  Ice pack      Vasopneumatic   Number Minutes Vasopneumatic  15 minutes    Vasopnuematic Location   Knee    Vasopneumatic Pressure  Medium    Vasopneumatic Temperature   32 pt. did not tolerated, ended after 2 mins             PT Education - 08/29/17 1449    Education provided  Yes    Education Details  RICE HEP    Person(s) Educated  Patient    Methods  Explanation;Demonstration;Verbal cues;Handout    Comprehension  Verbalized understanding;Returned demonstration       PT Short Term Goals - 08/29/17  1459      PT SHORT TERM GOAL #1   Title  independent with initial HEP    Time  2    Period  Weeks    Status  New        PT Long Term Goals - 08/29/17 1503      PT LONG TERM GOAL #1   Title  ambulate without AD for functional gait and OQL    Time  8    Period  Weeks    Status  New      PT LONG TERM GOAL #2   Title  decrease pain 50% for ADLs    Time  8    Period  Weeks    Status  New      PT LONG TERM GOAL #3   Title  increase knee AROM 0-120 for functional gait    Time  8    Period  Weeks    Status  New      PT LONG TERM GOAL #4   Title  decrease edema 2cm for functional gait    Baseline  43cm    Time  8    Period  Weeks    Status  New      PT LONG TERM GOAL #5   Title  decrease TUG time <13 seconds for functional gait and safety    Baseline  24    Time  8    Period  Weeks    Status  New      Additional Long Term Goals   Additional Long Term Goals  Yes      PT LONG TERM GOAL #6   Title  reports being able to participate fully in water aerobics without pain or limitation    Time  8    Period  Weeks    Status  New      PT LONG TERM GOAL #7   Title  increase overall knee strength 4/5 for functional gait    Time  8    Period  Weeks    Status  New      PT LONG TERM GOAL #8   Title  amb step over step on stairs without AD for functional gait    Time  8    Period  Weeks    Status  New             Plan - 08/29/17 1454    Clinical Impression Statement  Pt. s/p L TKR 08/14/17 and had PT in the hospital as well as at [facility] short-term inpatient rehab facility. Her stay there was a little over a week and used the CPM machine and she has exercises from them to do at home like supine hip ABD, seated marching, and long arc quads. Pt.AROM 26-94 and has increased pain when bending knee. Pt has lots of weakness; knee flex=3/5 due to too  much pain against resistance and knee ext.= 3-/5. We tried vaso today but pt. did not tolerate how cold it got or having her leg  elevated and had to stopped after 2 mins. Pt. tolerated ice pack with bolster under the knees and a towel under ice pack for 10 mins. Pt. currently has slight antalgic gait on L and AMB with walker but uses cane to go up and down stairs with supervision/guard.    Clinical Presentation  Evolving    Clinical Presentation due to:  s/p L TKR    Clinical Decision Making  Low    Rehab Potential  Good    PT Frequency  2x / week    PT Duration  8 weeks    PT Treatment/Interventions  Cryotherapy;Electrical Stimulation;Therapeutic activities;Therapeutic exercise;Patient/family education;Stair training;Gait training;Neuromuscular re-education;Scar mobilization;Passive range of motion;Manual techniques;Vasopneumatic Device    PT Next Visit Plan  begin strengthening and gait training    PT Home Exercise Plan  heel slides, SAQ    Consulted and Agree with Plan of Care  Patient       Patient will benefit from skilled therapeutic intervention in order to improve the following deficits and impairments:  Abnormal gait, Pain, Increased muscle spasms, Impaired tone, Decreased range of motion, Decreased endurance, Decreased activity tolerance, Decreased strength, Increased edema, Difficulty walking, Impaired flexibility  Visit Diagnosis: Acute postoperative pain of left knee  Difficulty in walking, not elsewhere classified  Localized edema  Stiffness of left knee, not elsewhere classified  Muscle weakness (generalized)     Problem List Patient Active Problem List   Diagnosis Date Noted  . 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 Acute bronchitis 04/02/2013  . COPD (chronic obstructive pulmonary disease) ([place]) 04/02/2013    Juliann Pulse SPT 08/29/2017, 3:34 PM  [address] [place], [name], [address] Phone: [phone]   Fax:  [phone]  Name: Emma [name] MRN: [id number] Date of Birth: Nov 30, 1940

## 2017-09-05 ENCOUNTER — Ambulatory Visit: Payer: Medicare Other | Admitting: Physical Therapy

## 2017-09-05 ENCOUNTER — Encounter: Payer: Self-pay | Admitting: Physical Therapy

## 2017-09-05 DIAGNOSIS — G8918 Other acute postprocedural pain: Secondary | ICD-10-CM

## 2017-09-05 DIAGNOSIS — M6281 Muscle weakness (generalized): Secondary | ICD-10-CM

## 2017-09-05 DIAGNOSIS — R600 Localized edema: Secondary | ICD-10-CM

## 2017-09-05 DIAGNOSIS — M25562 Pain in left knee: Principal | ICD-10-CM

## 2017-09-05 DIAGNOSIS — R262 Difficulty in walking, not elsewhere classified: Secondary | ICD-10-CM

## 2017-09-05 DIAGNOSIS — M25662 Stiffness of left knee, not elsewhere classified: Secondary | ICD-10-CM

## 2017-09-05 NOTE — Therapy (Signed)
During this treatment session, the therapist was present, participating in and directing the treatment. [place] [address] [place], [name], [address] Phone: [phone]   Fax:  [phone]  Physical Therapy Treatment  Patient Details  Name: Emma [name] MRN: [id number] Date of Birth: June 07, 1941 Referring Provider: Ronnie Derby   Encounter Date: 09/05/2017  PT End of Session - 09/05/17 1346    Visit Number  2    Date for PT Re-Evaluation  10/27/17    PT Start Time  1300    PT Stop Time  1348    PT Time Calculation (min)  48 min    Activity Tolerance  Patient tolerated treatment well    Behavior During Therapy  [facility] for tasks assessed/performed       Past Medical History:  Diagnosis Date  . Allergy   . Asthma    as child  . 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 pt. unsure of pathology  . Cataract   . Condyloma   . Constipation   . Hx of adenomatous colonic polyps   . Hypertension   . Hypothyroidism    2016- synthroid d/c'd   . Osteoarthritis    OA- knee- L knee, R thumb  . Osteopenia   . Thyroid disease   . Vitamin D deficiency     Past Surgical History:  Procedure Laterality Date  . BELPHAROPTOSIS REPAIR Bilateral 04/2015   vision correction  . BREAST BIOPSY     right   . CATARACT EXTRACTION, BILATERAL Bilateral 01/2014, 02/2014   /w IOL  . CESAREAN SECTION     x 2   . CHOLECYSTECTOMY    . EYE SURGERY    . GALLBLADDER SURGERY    . SHOULDER SURGERY  2006   right   . SHOULDER SURGERY Left 07/17/2013    bone spurs, and partial tear of rotator cuff  . TOTAL KNEE ARTHROPLASTY Left 08/14/2017   Procedure: LEFT TOTAL KNEE ARTHROPLASTY;  Surgeon: Vickey Huger, MD;  Location: [place];  Service: Orthopedics;  Laterality: Left;  . TUBAL LIGATION      There were no vitals filed for this visit.  Subjective Assessment - 09/05/17 1300    Subjective  Pt. reports overall feeling good. Pt. reports doing HEP and heel slides bothers her knee because of the bending.     Currently in Pain?  Yes    Pain Score  4                       [facility] Adult PT Treatment/Exercise - 09/05/17 0001      Ambulation/Gait   Gait Comments  pt. AMB from ex to ex throughout tx without AD, with light HHA slightly antalgic but needs minimal assistance      Knee/Hip Exercises: Aerobic   Recumbent Bike  partial revolutions x5 mins no resistance    Nustep  L2x6 mins      Knee/Hip Exercises: Machines for Strengthening   Cybex Leg Press  20# 2x10, LLE only no weight 5 reps working on [employer]      Knee/Hip Exercises: Seated   Long Arc Quad  Left;2 sets;10 reps;Weights    Long Arc Quad Weight  2 lbs.    Hamstring Curl  Left;2 sets;10 reps;Limitations    Hamstring Limitations  red tband      Modalities  Modalities  Cryotherapy      Cryotherapy   Number Minutes Cryotherapy  10 Minutes    Cryotherapy Location  Knee    Type of Cryotherapy  Ice pack      Manual Therapy   Manual Therapy  Joint mobilization;Passive ROM    Manual therapy comments  patella mobs reveal slight decrease in mobility     Passive ROM  passive ROM to end range of knee flexion and extension with hold               PT Short Term Goals - 09/05/17 1352      PT SHORT TERM GOAL #1   Title  independent with initial HEP    Time  2    Period  Weeks    Status  On-going        PT Long Term Goals - 08/29/17 1503      PT LONG TERM GOAL #1   Title  ambulate without AD for functional gait and OQL    Time  8    Period  Weeks    Status  New      PT LONG TERM GOAL #2   Title  decrease pain 50% for ADLs    Time  8    Period  Weeks    Status  New      PT LONG TERM GOAL #3   Title  increase knee AROM 0-120 for functional gait    Time  8    Period  Weeks    Status  New      PT LONG TERM GOAL #4   Title  decrease edema 2cm for functional gait     Baseline  43cm    Time  8    Period  Weeks    Status  New      PT LONG TERM GOAL #5   Title  decrease TUG time <13 seconds for functional gait and safety    Baseline  24    Time  8    Period  Weeks    Status  New      Additional Long Term Goals   Additional Long Term Goals  Yes      PT LONG TERM GOAL #6   Title  reports being able to participate fully in water aerobics without pain or limitation    Time  8    Period  Weeks    Status  New      PT LONG TERM GOAL #7   Title  increase overall knee strength 4/5 for functional gait    Time  8    Period  Weeks    Status  New      PT LONG TERM GOAL #8   Title  amb step over step on stairs without AD for functional gait    Time  8    Period  Weeks    Status  New            Plan - 09/05/17 1347    Clinical Impression Statement  Pt. tolerated tx well. Pt. was unable to do full revolutions on bike due to increase in pain with knee flexion at end ranges. Pt. needs verbal aand tactil cues for TKE and quad activation with long arc quad. Had to stop icing after 8 minutes due to transportation needing to leave. Pt. does well with light HHA gait. We should teach how to use SPC at next appointment for safe gait  outside of PT but pt. ABD well with light HHA.    Rehab Potential  Good    PT Frequency  2x / week    PT Duration  8 weeks    PT Treatment/Interventions  Cryotherapy;Electrical Stimulation;Therapeutic activities;Therapeutic exercise;Patient/family education;Stair training;Gait training;Neuromuscular re-education;Scar mobilization;Passive range of motion;Manual techniques;Vasopneumatic Device    PT Next Visit Plan  gait training with SPC, continue strengthening    Consulted and Agree with Plan of Care  Patient       Patient will benefit from skilled therapeutic intervention in order to improve the following deficits and impairments:  Abnormal gait, Pain, Increased muscle spasms, Impaired tone, Decreased range of motion,  Decreased endurance, Decreased activity tolerance, Decreased strength, Increased edema, Difficulty walking, Impaired flexibility  Visit Diagnosis: Acute postoperative pain of left knee  Difficulty in walking, not elsewhere classified  Localized edema  Stiffness of left knee, not elsewhere classified  Muscle weakness (generalized)     Problem List Patient Active Problem List   Diagnosis Date Noted  . 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 Acute bronchitis 04/02/2013  . COPD (chronic obstructive pulmonary disease) ([place]) 04/02/2013    Juliann Pulse SPT 09/05/2017, 1:55 PM  [place] [address] [place], [name], [address] Phone: [phone]   Fax:  [phone]  Name: Emma [name] MRN: [id number] Date of Birth: 1940-09-13

## 2017-09-07 ENCOUNTER — Ambulatory Visit: Payer: Medicare Other | Admitting: Physical Therapy

## 2017-09-07 ENCOUNTER — Encounter: Payer: Self-pay | Admitting: Physical Therapy

## 2017-09-07 DIAGNOSIS — R262 Difficulty in walking, not elsewhere classified: Secondary | ICD-10-CM

## 2017-09-07 DIAGNOSIS — R600 Localized edema: Secondary | ICD-10-CM

## 2017-09-07 DIAGNOSIS — M25562 Pain in left knee: Secondary | ICD-10-CM

## 2017-09-07 DIAGNOSIS — G8918 Other acute postprocedural pain: Secondary | ICD-10-CM | POA: Diagnosis not present

## 2017-09-07 DIAGNOSIS — M25662 Stiffness of left knee, not elsewhere classified: Secondary | ICD-10-CM

## 2017-09-07 NOTE — Therapy (Signed)
[place], [name], [address] Phone: [phone]   Fax:  [phone]  Physical Therapy Treatment  Patient Details  Name: Emma [name] MRN: [id number] Date of Birth: 25-Dec-1940 Referring Provider: Ronnie Derby   Encounter Date: 09/07/2017  PT End of Session - 09/07/17 1428    Visit Number  3    Date for PT Re-Evaluation  10/27/17    PT Start Time  1345    PT Stop Time  1437    PT Time Calculation (min)  52 min    Activity Tolerance  Patient tolerated treatment well    Behavior During Therapy  [facility] for tasks assessed/performed       Past Medical History:  Diagnosis Date  . Allergy   . Asthma    as child  . 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 pt. unsure of pathology  . Cataract   . Condyloma   . Constipation   . Hx of adenomatous colonic polyps   . Hypertension   . Hypothyroidism    2016- synthroid d/c'd   . Osteoarthritis    OA- knee- L knee, R thumb  . Osteopenia   . Thyroid disease   . Vitamin D deficiency     Past Surgical History:  Procedure Laterality Date  . BELPHAROPTOSIS REPAIR Bilateral 04/2015   vision correction  . BREAST BIOPSY     right   . CATARACT EXTRACTION, BILATERAL Bilateral 01/2014, 02/2014   /w IOL  . CESAREAN SECTION     x 2   . CHOLECYSTECTOMY    . EYE SURGERY    . GALLBLADDER SURGERY    . SHOULDER SURGERY  2006   right   . SHOULDER SURGERY Left 07/17/2013    bone spurs, and partial tear of rotator cuff  . TOTAL KNEE ARTHROPLASTY Left 08/14/2017   Procedure: LEFT TOTAL KNEE ARTHROPLASTY;  Surgeon: Vickey Huger, MD;  Location: [place];  Service: Orthopedics;  Laterality: Left;  . TUBAL LIGATION      There were no vitals filed for this visit.  Subjective Assessment - 09/07/17 1349    Subjective  pt reports having some pain the night after last therapy session 5/10.    Pain Score  3     Pain Location  Knee    Pain Orientation  Left                      [facility] Adult PT Treatment/Exercise - 09/07/17 0001      Ambulation/Gait   Gait Comments  pt AMB in clininc without AD, decrease ahr swing RUE. Antallgic for ht first few steps.      Knee/Hip Exercises: Aerobic   Recumbent Bike  partial revolutions x3 mins no resistance    Nustep  L2x6 mins      Knee/Hip Exercises: Machines for Strengthening   Cybex Leg Press  20# 2x10, LLE only no weight 5 reps working on [employer]      Knee/Hip Exercises: Seated   Long Arc Quad  Left;2 sets;10 reps;Weights    Long Arc Quad Weight  2 lbs.    Hamstring Curl  Left;2 sets;10 reps;Limitations    Hamstring Limitations  red tband      Knee/Hip Exercises: Supine   Short Arc Quad Sets  Left;10 reps;2 sets      Modalities   Modalities  Cryotherapy      Cryotherapy   Number Minutes Cryotherapy  10 Minutes    Cryotherapy Location  Knee    Type of Cryotherapy  Ice pack      Manual Therapy   Manual Therapy  Passive ROM    Passive ROM  passive ROM to end range of knee flexion and extension with hold               PT Short Term Goals - 09/05/17 1352      PT SHORT TERM GOAL #1   Title  independent with initial HEP    Time  2    Period  Weeks    Status  On-going        PT Long Term Goals - 08/29/17 1503      PT LONG TERM GOAL #1   Title  ambulate without AD for functional gait and OQL    Time  8    Period  Weeks    Status  New      PT LONG TERM GOAL #2   Title  decrease pain 50% for ADLs    Time  8    Period  Weeks    Status  New      PT LONG TERM GOAL #3   Title  increase knee AROM 0-120 for functional gait    Time  8    Period  Weeks    Status  New      PT LONG TERM GOAL #4   Title  decrease edema 2cm for functional gait    Baseline  43cm    Time  8    Period  Weeks    Status  New      PT LONG TERM GOAL #5   Title  decrease TUG time <13 seconds for functional gait and safety    Baseline   24    Time  8    Period  Weeks    Status  New      Additional Long Term Goals   Additional Long Term Goals  Yes      PT LONG TERM GOAL #6   Title  reports being able to participate fully in water aerobics without pain or limitation    Time  8    Period  Weeks    Status  New      PT LONG TERM GOAL #7   Title  increase overall knee strength 4/5 for functional gait    Time  8    Period  Weeks    Status  New      PT LONG TERM GOAL #8   Title  amb step over step on stairs without AD for functional gait    Time  8    Period  Weeks    Status  New            Plan - 09/07/17 1429    Clinical Impression Statement  Still pt unable to do full revolutions on ike but her ROM is well. Reports some pain with LAQ. Pt does ambulate around clinic without AD. Antalgic gait for the first few steps. Pt ambulate without AD than with SPC.    PT Frequency  2x / week    PT Duration  8 weeks    PT Treatment/Interventions  Cryotherapy;Electrical Stimulation;Therapeutic activities;Therapeutic exercise;Patient/family education;Stair training;Gait training;Neuromuscular re-education;Scar mobilization;Passive range of motion;Manual techniques;Vasopneumatic Device    PT Next Visit Plan  gait training without AD, continue  strengthening       Patient will benefit from skilled therapeutic intervention in order to improve the following deficits and impairments:  Abnormal gait, Pain, Increased muscle spasms, Impaired tone, Decreased range of motion, Decreased endurance, Decreased activity tolerance, Decreased strength, Increased edema, Difficulty walking, Impaired flexibility  Visit Diagnosis: Difficulty in walking, not elsewhere classified  Acute postoperative pain of left knee  Localized edema  Stiffness of left knee, not elsewhere classified     Problem List Patient Active Problem List   Diagnosis Date Noted  . 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 Acute bronchitis 04/02/2013  . COPD  (chronic obstructive pulmonary disease) ([place]) 04/02/2013    Scot Jun, PTA 09/07/2017, 2:35 PM  [address] [place], [name], [address] Phone: [phone]   Fax:  [phone]  Name: Emma [name] MRN: [id number] Date of Birth: 1941-04-24

## 2017-09-12 ENCOUNTER — Ambulatory Visit: Payer: Medicare Other | Attending: Orthopedic Surgery | Admitting: Physical Therapy

## 2017-09-12 ENCOUNTER — Encounter: Payer: Self-pay | Admitting: Physical Therapy

## 2017-09-12 DIAGNOSIS — R600 Localized edema: Secondary | ICD-10-CM

## 2017-09-12 DIAGNOSIS — M25662 Stiffness of left knee, not elsewhere classified: Secondary | ICD-10-CM | POA: Diagnosis present

## 2017-09-12 DIAGNOSIS — M6281 Muscle weakness (generalized): Secondary | ICD-10-CM | POA: Diagnosis present

## 2017-09-12 DIAGNOSIS — G8918 Other acute postprocedural pain: Secondary | ICD-10-CM | POA: Diagnosis present

## 2017-09-12 DIAGNOSIS — R262 Difficulty in walking, not elsewhere classified: Secondary | ICD-10-CM

## 2017-09-12 DIAGNOSIS — M25562 Pain in left knee: Secondary | ICD-10-CM

## 2017-09-12 NOTE — Therapy (Signed)
[place], [name], [address] Phone: [phone]   Fax:  [phone]  Physical Therapy Treatment  Patient Details  Name: Emma [name] MRN: [id number] Date of Birth: Mar 21, 1941 Referring Provider: Ronnie Derby   Encounter Date: 09/12/2017  PT End of Session - 09/12/17 1422    Visit Number  4    Date for PT Re-Evaluation  10/27/17    PT Start Time  1315 ice    PT Stop Time  1409    PT Time Calculation (min)  54 min    Activity Tolerance  Patient tolerated treatment well    Behavior During Therapy  Anxious       Past Medical History:  Diagnosis Date  . Allergy   . Asthma    as child  . 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 pt. unsure of pathology  . Cataract   . Condyloma   . Constipation   . Hx of adenomatous colonic polyps   . Hypertension   . Hypothyroidism    2016- synthroid d/c'd   . Osteoarthritis    OA- knee- L knee, R thumb  . Osteopenia   . Thyroid disease   . Vitamin D deficiency     Past Surgical History:  Procedure Laterality Date  . BELPHAROPTOSIS REPAIR Bilateral 04/2015   vision correction  . BREAST BIOPSY     right   . CATARACT EXTRACTION, BILATERAL Bilateral 01/2014, 02/2014   /w IOL  . CESAREAN SECTION     x 2   . CHOLECYSTECTOMY    . EYE SURGERY    . GALLBLADDER SURGERY    . SHOULDER SURGERY  2006   right   . SHOULDER SURGERY Left 07/17/2013    bone spurs, and partial tear of rotator cuff  . TOTAL KNEE ARTHROPLASTY Left 08/14/2017   Procedure: LEFT TOTAL KNEE ARTHROPLASTY;  Surgeon: Vickey Huger, MD;  Location: [place];  Service: Orthopedics;  Laterality: Left;  . TUBAL LIGATION      There were no vitals filed for this visit.  Subjective Assessment - 09/12/17 1319    Subjective  Patient reports not taking pain medicine for the past 3 days, reports some pain inthe left lateral knee, reports  that she has been active    Currently in Pain?  Yes    Pain Score  4     Pain Location  Knee    Pain Orientation  Left         [facility] PT Assessment - 09/12/17 0001      PROM   Left Knee Extension  10    Left Knee Flexion  106                  [facility] Adult PT Treatment/Exercise - 09/12/17 0001      Ambulation/Gait   Gait Comments  had patient ambulate without device in the clinic, we also walked 100' x 2 without device      High Level Balance   High Level Balance Activities  Side stepping;Backward walking;Negotitating around obstacles;Negotiating over obstacles    High Level Balance Comments  with SPC for the obstacle      Knee/Hip Exercises: Aerobic   Recumbent Bike  partial revolutions x3 mins no resistance    Nustep  L4x6 mins      Knee/Hip Exercises: Machines for Strengthening   Cybex  Leg Press  no weight 2x10, then left only no weight 2x5      Knee/Hip Exercises: Supine   Short Arc Quad Sets  Left;10 reps;2 sets    Other Supine Knee/Hip Exercises  feet on ball K2C, and bridges      Cryotherapy   Number Minutes Cryotherapy  10 Minutes    Cryotherapy Location  Knee    Type of Cryotherapy  Ice pack      Manual Therapy   Manual Therapy  Passive ROM    Passive ROM  passive ROM to end range of knee flexion and extension with hold               PT Short Term Goals - 09/05/17 1352      PT SHORT TERM GOAL #1   Title  independent with initial HEP    Time  2    Period  Weeks    Status  On-going        PT Long Term Goals - 09/12/17 1425      PT LONG TERM GOAL #1   Title  ambulate without AD for functional gait and OQL    Status  On-going      PT LONG TERM GOAL #2   Title  decrease pain 50% for ADLs    Status  On-going      PT LONG TERM GOAL #3   Title  increase knee AROM 0-120 for functional gait    Status  On-going      PT LONG TERM GOAL #4   Title  decrease edema 2cm for functional gait    Status  On-going            Plan -  09/12/17 1423    Clinical Impression Statement  Patient tends to over think things and really struggles with using a cane, this made her slow.  She needed a lot of cues to not think about things and take bigger steps.  Her ROM is doing well, she is still unable to make full revolutions on the bike.    PT Next Visit Plan  work on gait and functional safety    Consulted and Agree with Plan of Care  Patient       Patient will benefit from skilled therapeutic intervention in order to improve the following deficits and impairments:  Abnormal gait, Pain, Increased muscle spasms, Impaired tone, Decreased range of motion, Decreased endurance, Decreased activity tolerance, Decreased strength, Increased edema, Difficulty walking, Impaired flexibility  Visit Diagnosis: Difficulty in walking, not elsewhere classified  Acute postoperative pain of left knee  Localized edema  Stiffness of left knee, not elsewhere classified  Muscle weakness (generalized)     Problem List Patient Active Problem List   Diagnosis Date Noted  . 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 Acute bronchitis 04/02/2013  . COPD (chronic obstructive pulmonary disease) ([place]) 04/02/2013    Sumner Boast., PT 09/12/2017, 2:27 PM  [place] [facility] [address] [address] [place], [name], [address] Phone: [phone]   Fax:  [phone]  Name: Emma [name] MRN: [id number] Date of Birth: Mar 11, 1941

## 2017-09-14 ENCOUNTER — Encounter: Payer: Self-pay | Admitting: Physical Therapy

## 2017-09-14 ENCOUNTER — Ambulatory Visit: Payer: Medicare Other | Admitting: Physical Therapy

## 2017-09-14 DIAGNOSIS — G8918 Other acute postprocedural pain: Secondary | ICD-10-CM

## 2017-09-14 DIAGNOSIS — R262 Difficulty in walking, not elsewhere classified: Secondary | ICD-10-CM | POA: Diagnosis not present

## 2017-09-14 DIAGNOSIS — M25662 Stiffness of left knee, not elsewhere classified: Secondary | ICD-10-CM

## 2017-09-14 DIAGNOSIS — M25562 Pain in left knee: Secondary | ICD-10-CM

## 2017-09-14 DIAGNOSIS — R600 Localized edema: Secondary | ICD-10-CM

## 2017-09-14 DIAGNOSIS — M6281 Muscle weakness (generalized): Secondary | ICD-10-CM

## 2017-09-14 NOTE — Therapy (Signed)
[place], [name], [address] Phone: [phone]   Fax:  [phone]  Physical Therapy Treatment  Patient Details  Name: Emma [name] MRN: [id number] Date of Birth: 08/14/1940 Referring Provider: Ronnie Derby   Encounter Date: 09/14/2017  PT End of Session - 09/14/17 1354    Visit Number  5    Date for PT Re-Evaluation  10/27/17    PT Start Time  9233 ice pack    PT Stop Time  1413    PT Time Calculation (min)  60 min    Activity Tolerance  Patient tolerated treatment well    Behavior During Therapy  [facility] for tasks assessed/performed       Past Medical History:  Diagnosis Date  . Allergy   . Asthma    as child  . 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 pt. unsure of pathology  . Cataract   . Condyloma   . Constipation   . Hx of adenomatous colonic polyps   . Hypertension   . Hypothyroidism    2016- synthroid d/c'd   . Osteoarthritis    OA- knee- L knee, R thumb  . Osteopenia   . Thyroid disease   . Vitamin D deficiency     Past Surgical History:  Procedure Laterality Date  . BELPHAROPTOSIS REPAIR Bilateral 04/2015   vision correction  . BREAST BIOPSY     right   . CATARACT EXTRACTION, BILATERAL Bilateral 01/2014, 02/2014   /w IOL  . CESAREAN SECTION     x 2   . CHOLECYSTECTOMY    . EYE SURGERY    . GALLBLADDER SURGERY    . SHOULDER SURGERY  2006   right   . SHOULDER SURGERY Left 07/17/2013    bone spurs, and partial tear of rotator cuff  . TOTAL KNEE ARTHROPLASTY Left 08/14/2017   Procedure: LEFT TOTAL KNEE ARTHROPLASTY;  Surgeon: Vickey Huger, MD;  Location: [place];  Service: Orthopedics;  Laterality: Left;  . TUBAL LIGATION      There were no vitals filed for this visit.  Subjective Assessment - 09/14/17 1316    Subjective  Patient reports that she is very stiff today    Currently in Pain?  Yes    Pain  Score  4     Pain Location  Knee    Pain Orientation  Left    Pain Descriptors / Indicators  Tightness    Aggravating Factors   standing, walking         [facility] PT Assessment - 09/14/17 0001      AROM   Left Knee Extension  14    Left Knee Flexion  100                  [facility] Adult PT Treatment/Exercise - 09/14/17 0001      Ambulation/Gait   Gait Comments  had patient ambulate without device in the clinic, we also walked 100' x 2 without device, worked on stairs with [facility] and one hand rail, as she reports that she will need to do this to be able to get out and drive on her own.      High Level Balance   High Level Balance Activities  Side stepping;Backward walking;Negotitating around obstacles;Negotiating over obstacles    High Level Balance Comments  with SPC for the obstacle  Knee/Hip Exercises: Aerobic   Recumbent Bike  able to do full revolutions seat position #3, 5 minutes    Nustep  L4x6 mins      Knee/Hip Exercises: Machines for Strengthening   Cybex Knee Extension  5# 2x10, needing rest and a lot of cues to get left quad to work    Cybex Knee Flexion  20# 2x10    Cybex Leg Press  no weight 2x10, then left only no weight 2x5      Knee/Hip Exercises: Standing   Forward Step Up  Step Height: 4";15 reps    Step Down  Step Height: 2";Step Height: 4";15 reps      Cryotherapy   Number Minutes Cryotherapy  10 Minutes    Cryotherapy Location  Knee    Type of Cryotherapy  Ice pack      Manual Therapy   Manual Therapy  Passive ROM    Passive ROM  passive ROM to end range of knee flexion and extension with hold               PT Short Term Goals - 09/14/17 1355      PT SHORT TERM GOAL #1   Title  independent with initial HEP    Status  Achieved        PT Long Term Goals - 09/12/17 1425      PT LONG TERM GOAL #1   Title  ambulate without AD for functional gait and OQL    Status  On-going      PT LONG TERM GOAL #2   Title  decrease pain  50% for ADLs    Status  On-going      PT LONG TERM GOAL #3   Title  increase knee AROM 0-120 for functional gait    Status  On-going      PT LONG TERM GOAL #4   Title  decrease edema 2cm for functional gait    Status  On-going            Plan - 09/14/17 1354    Clinical Impression Statement  Patient doing better with her ROM, she did well with stairs how she was taught one at a time, then was able to do 4 " steps step up and step down.    PT Next Visit Plan  continue to progress her gait and function as well as ROM    Consulted and Agree with Plan of Care  Patient       Patient will benefit from skilled therapeutic intervention in order to improve the following deficits and impairments:  Abnormal gait, Pain, Increased muscle spasms, Impaired tone, Decreased range of motion, Decreased endurance, Decreased activity tolerance, Decreased strength, Increased edema, Difficulty walking, Impaired flexibility  Visit Diagnosis: Difficulty in walking, not elsewhere classified  Acute postoperative pain of left knee  Localized edema  Stiffness of left knee, not elsewhere classified  Muscle weakness (generalized)     Problem List Patient Active Problem List   Diagnosis Date Noted  . 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 Acute bronchitis 04/02/2013  . COPD (chronic obstructive pulmonary disease) ([place]) 04/02/2013    Sumner Boast., PT 09/14/2017, 1:56 PM  [facility] [place] [address] [place], [name], [address] Phone: [phone]   Fax:  [phone]  Name: Emma [name] MRN: [id number] Date of Birth: [birth year]

## 2017-09-20 ENCOUNTER — Encounter: Payer: Self-pay | Admitting: Physical Therapy

## 2017-09-20 ENCOUNTER — Ambulatory Visit: Payer: Medicare Other | Admitting: Physical Therapy

## 2017-09-20 DIAGNOSIS — R600 Localized edema: Secondary | ICD-10-CM

## 2017-09-20 DIAGNOSIS — M25562 Pain in left knee: Secondary | ICD-10-CM

## 2017-09-20 DIAGNOSIS — R262 Difficulty in walking, not elsewhere classified: Secondary | ICD-10-CM | POA: Diagnosis not present

## 2017-09-20 DIAGNOSIS — G8918 Other acute postprocedural pain: Secondary | ICD-10-CM

## 2017-09-20 NOTE — Therapy (Addendum)
[place], [name], [address] Phone: [phone]   Fax:  [phone]  Physical Therapy Treatment  Patient Details  Name: Emma [name] MRN: [id number] Date of Birth: Oct 02, 1940 Referring Provider: Ronnie Derby   Encounter Date: 09/20/2017  PT End of Session - 09/20/17 1434    Visit Number  6    Date for PT Re-Evaluation  10/27/17    PT Start Time  1345    PT Stop Time  1442    PT Time Calculation (min)  57 min    Activity Tolerance  Patient tolerated treatment well    Behavior During Therapy  [facility] for tasks assessed/performed       Past Medical History:  Diagnosis Date  . Allergy   . Asthma    as child  . 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 pt. unsure of pathology  . Cataract   . Condyloma   . Constipation   . Hx of adenomatous colonic polyps   . Hypertension   . Hypothyroidism    2016- synthroid d/c'd   . Osteoarthritis    OA- knee- L knee, R thumb  . Osteopenia   . Thyroid disease   . Vitamin D deficiency     Past Surgical History:  Procedure Laterality Date  . BELPHAROPTOSIS REPAIR Bilateral 04/2015   vision correction  . BREAST BIOPSY     right   . CATARACT EXTRACTION, BILATERAL Bilateral 01/2014, 02/2014   /w IOL  . CESAREAN SECTION     x 2   . CHOLECYSTECTOMY    . EYE SURGERY    . GALLBLADDER SURGERY    . SHOULDER SURGERY  2006   right   . SHOULDER SURGERY Left 07/17/2013    bone spurs, and partial tear of rotator cuff  . TOTAL KNEE ARTHROPLASTY Left 08/14/2017   Procedure: LEFT TOTAL KNEE ARTHROPLASTY;  Surgeon: Vickey Huger, MD;  Location: [place];  Service: Orthopedics;  Laterality: Left;  . TUBAL LIGATION      There were no vitals filed for this visit.  Subjective Assessment - 09/20/17 1346    Subjective  "Fair    Currently in Pain?  No/denies    Pain Score  0-No pain                       [facility] Adult PT Treatment/Exercise - 09/20/17 0001      Knee/Hip Exercises: Aerobic   Recumbent Bike  able to do full revolutions seat position #3, 5 minutes    Nustep  L4x6 mins      Knee/Hip Exercises: Machines for Strengthening   Cybex Knee Extension  5# x2 stopped due to pain    Cybex Knee Flexion  20# 2x10    Cybex Leg Press  no weight 2x10, then left only no weight 2x5      Knee/Hip Exercises: Seated   Long Arc Quad  Left;2 sets;10 reps;Weights    Long Arc Quad Weight  2 lbs.    Hamstring Curl  Left;2 sets;10 reps;Limitations    Hamstring Limitations  red tband      Cryotherapy   Number Minutes Cryotherapy  10 Minutes    Cryotherapy Location  Knee    Type of Cryotherapy  Ice pack      Manual Therapy   Manual Therapy  Passive ROM  Passive ROM  passive ROM to end range of knee flexion and extension with hold       L knee AROM 9-106 degrees         PT Short Term Goals - 09/14/17 1355      PT SHORT TERM GOAL #1   Title  independent with initial HEP    Status  Achieved        PT Long Term Goals - 09/12/17 1425      PT LONG TERM GOAL #1   Title  ambulate without AD for functional gait and OQL    Status  On-going      PT LONG TERM GOAL #2   Title  decrease pain 50% for ADLs    Status  On-going      PT LONG TERM GOAL #3   Title  increase knee AROM 0-120 for functional gait    Status  On-going      PT LONG TERM GOAL #4   Title  decrease edema 2cm for functional gait    Status  On-going            Plan - 09/20/17 1436    Clinical Impression Statement  Pt c/o increase L knee soreness today. Pt with some difficulty making full revolutions on recumbent bike. Discontinued leg extension on machine due to pain. Tolerated LAQ and seated Tband HS curls well.    Rehab Potential  Good    PT Frequency  2x / week    PT Duration  8 weeks    PT Treatment/Interventions  Cryotherapy;Electrical Stimulation;Therapeutic  activities;Therapeutic exercise;Patient/family education;Stair training;Gait training;Neuromuscular re-education;Scar mobilization;Passive range of motion;Manual techniques;Vasopneumatic Device;Visual/perceptual remediation/compensation       Patient will benefit from skilled therapeutic intervention in order to improve the following deficits and impairments:  Abnormal gait, Pain, Increased muscle spasms, Impaired tone, Decreased range of motion, Decreased endurance, Decreased activity tolerance, Decreased strength, Increased edema, Difficulty walking, Impaired flexibility  Visit Diagnosis: Difficulty in walking, not elsewhere classified  Acute postoperative pain of left knee  Localized edema     Problem List Patient Active Problem List   Diagnosis Date Noted  . 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 Acute bronchitis 04/02/2013  . COPD (chronic obstructive pulmonary disease) ([place]) 04/02/2013    Scot Jun, PTA 09/20/2017, 2:41 PM  [place] [address] [place], [name], [address] Phone: [phone]   Fax:  [phone]  Name: DENETTA FEI MRN: [id number] Date of Birth: May 23, 1941

## 2017-09-22 ENCOUNTER — Ambulatory Visit: Payer: Medicare Other | Admitting: Physical Therapy

## 2017-09-22 DIAGNOSIS — R600 Localized edema: Secondary | ICD-10-CM

## 2017-09-22 DIAGNOSIS — M25662 Stiffness of left knee, not elsewhere classified: Secondary | ICD-10-CM

## 2017-09-22 DIAGNOSIS — R262 Difficulty in walking, not elsewhere classified: Secondary | ICD-10-CM | POA: Diagnosis not present

## 2017-09-22 DIAGNOSIS — G8918 Other acute postprocedural pain: Secondary | ICD-10-CM

## 2017-09-22 DIAGNOSIS — M25562 Pain in left knee: Secondary | ICD-10-CM

## 2017-09-22 DIAGNOSIS — M6281 Muscle weakness (generalized): Secondary | ICD-10-CM

## 2017-09-22 NOTE — Therapy (Signed)
[facility] [address] [place], [place], [address] Phone: [phone]   Fax:  [phone]  Physical Therapy Treatment  Patient Details  Name: Emma [name] MRN: [id number] Date of Birth: 03/16/1941 Referring Provider: Lucey   Encounter Date: 09/22/2017  PT End of Session - 09/22/17 1130    Visit Number  7    Date for PT Re-Evaluation  10/27/17    PT Start Time  1055    PT Stop Time  1145    PT Time Calculation (min)  50 min       Past Medical History:  Diagnosis Date  . Allergy   . Asthma    as child  . 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 Jordan removed ? pt. unsure of pathology  . Cataract   . Condyloma   . Constipation   . Hx of adenomatous colonic polyps   . Hypertension   . Hypothyroidism    2016- synthroid d/c'd   . Osteoarthritis    OA- knee- L knee, R thumb  . Osteopenia   . Thyroid disease   . Vitamin D deficiency     Past Surgical History:  Procedure Laterality Date  . BELPHAROPTOSIS REPAIR Bilateral 04/2015   vision correction  . BREAST BIOPSY     right   . CATARACT EXTRACTION, BILATERAL Bilateral 01/2014, 02/2014   /w IOL  . CESAREAN SECTION     x 2   . CHOLECYSTECTOMY    . EYE SURGERY    . GALLBLADDER SURGERY    . SHOULDER SURGERY  2006   right   . SHOULDER SURGERY Left 07/17/2013    bone spurs, and partial tear of rotator cuff  . TOTAL KNEE ARTHROPLASTY Left 08/14/2017   Procedure: LEFT TOTAL KNEE ARTHROPLASTY;  Surgeon: Lucey, Steve, MD;  [facility];  Service: Orthopedics;  Laterality: Left;  . TUBAL LIGATION      There were no vitals filed for this visit.  Subjective Assessment - 09/22/17 1106    Subjective  very sore after a hard work out last time. saw MD and he said cont 2 x week 4 weeks. amb in without AD    Currently in Pain?  Yes    Pain Score  3     Pain Location  Knee    Pain Orientation  Left                       [facility] Adult PT Treatment/Exercise - 09/22/17 0001      Knee/Hip Exercises: Aerobic   Recumbent Bike  6 min seat #3 full rev    Nustep  L4x6 mins LE only      Knee/Hip Exercises: Machines for Strengthening   Cybex Leg Press  2 sets 10 with cuing for full ext      Knee/Hip Exercises: Standing   Heel Raises  Both;2 sets;10 reps;3 seconds black bar      Knee/Hip Exercises: Seated   Long Arc Quad  Strengthening;Left;2 sets;10 reps red tband    Other Seated Knee/Hip Exercises  TKE red tband 2 sets 10    Hamstring Curl  Strengthening;Left;2 sets;10 reps red tband      Manual Therapy   Manual Therapy  Passive ROM    Passive ROM  passive ROM to end range of knee flexion and extension with hold                 PT Short Term Goals - 09/14/17 1355      PT SHORT TERM GOAL #1   Title  independent with initial HEP    Status  Achieved        PT Long Term Goals - 09/22/17 1129      PT LONG TERM GOAL #1   Title  ambulate without AD for functional gait and OQL    Status  Partially Met      PT LONG TERM GOAL #2   Title  decrease pain 50% for ADLs    Status  Partially Met      PT LONG TERM GOAL #3   Title  increase knee AROM 0-120 for functional gait    Status  On-going      PT LONG TERM GOAL #4   Title  decrease edema 2cm for functional gait    Status  On-going      PT LONG TERM GOAL #5   Title  decrease TUG time <13 seconds for functional gait and safety    Status  Achieved      PT LONG TERM GOAL #6   Title  reports being able to participate fully in water aerobics without pain or limitation    Status  On-going      PT LONG TERM GOAL #7   Title  increase overall knee strength 4/5 for functional gait    Status  On-going      PT LONG TERM GOAL #8   Title  amb step over step on stairs without AD for functional gait    Status  On-going            Plan - 09/22/17 1130    Clinical Impression Statement  TUG goal met.progressing  towards all others. focus on extension today.    PT Treatment/Interventions  Cryotherapy;Electrical Stimulation;Therapeutic activities;Therapeutic exercise;Patient/family education;Stair training;Gait training;Neuromuscular re-education;Scar mobilization;Passive range of motion;Manual techniques;Vasopneumatic Device;Visual/perceptual remediation/compensation    PT Next Visit Plan  continue to progress her gait and function as well as ROM       Patient will benefit from skilled therapeutic intervention in order to improve the following deficits and impairments:  Abnormal gait, Pain, Increased muscle spasms, Impaired tone, Decreased range of motion, Decreased endurance, Decreased activity tolerance, Decreased strength, Increased edema, Difficulty walking, Impaired flexibility  Visit Diagnosis: Difficulty in walking, not elsewhere classified  Acute postoperative pain of left knee  Localized edema  Stiffness of left knee, not elsewhere classified  Muscle weakness (generalized)     Problem List Patient Active Problem List   Diagnosis Date Noted  . 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 Acute bronchitis 04/02/2013  . COPD (chronic obstructive pulmonary disease) ([place]) 04/02/2013    PAYSEUR,ANGIE PTA 09/22/2017, 11:36 AM  [place] [address] [place], [name], [address] Phone: [phone]   Fax:  [phone]  Name: AMANEE IACOVELLI MRN: [id number] Date of Birth: March 03, 1941

## 2017-09-26 ENCOUNTER — Ambulatory Visit: Payer: Medicare Other | Admitting: Physical Therapy

## 2017-09-26 ENCOUNTER — Encounter: Payer: Self-pay | Admitting: Physical Therapy

## 2017-09-26 DIAGNOSIS — M25562 Pain in left knee: Secondary | ICD-10-CM

## 2017-09-26 DIAGNOSIS — G8918 Other acute postprocedural pain: Secondary | ICD-10-CM

## 2017-09-26 DIAGNOSIS — R600 Localized edema: Secondary | ICD-10-CM

## 2017-09-26 DIAGNOSIS — R262 Difficulty in walking, not elsewhere classified: Secondary | ICD-10-CM | POA: Diagnosis not present

## 2017-09-26 DIAGNOSIS — M6281 Muscle weakness (generalized): Secondary | ICD-10-CM

## 2017-09-26 DIAGNOSIS — M25662 Stiffness of left knee, not elsewhere classified: Secondary | ICD-10-CM

## 2017-09-26 NOTE — Therapy (Signed)
[place], [name], [address] Phone: [phone]   Fax:  [phone]  Physical Therapy Treatment  Patient Details  Name: Emma [name] MRN: [id number] Date of Birth: 08-14-1940 Referring Provider: Ronnie Derby   Encounter Date: 09/26/2017  PT End of Session - 09/26/17 1350    Visit Number  8    Date for PT Re-Evaluation  10/27/17    PT Start Time  0165    PT Stop Time  5374    PT Time Calculation (min)  42 min    Activity Tolerance  Patient tolerated treatment well    Behavior During Therapy  [facility] for tasks assessed/performed       Past Medical History:  Diagnosis Date  . Allergy   . Asthma    as child  . 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 pt. unsure of pathology  . Cataract   . Condyloma   . Constipation   . Hx of adenomatous colonic polyps   . Hypertension   . Hypothyroidism    2016- synthroid d/c'd   . Osteoarthritis    OA- knee- L knee, R thumb  . Osteopenia   . Thyroid disease   . Vitamin D deficiency     Past Surgical History:  Procedure Laterality Date  . BELPHAROPTOSIS REPAIR Bilateral 04/2015   vision correction  . BREAST BIOPSY     right   . CATARACT EXTRACTION, BILATERAL Bilateral 01/2014, 02/2014   /w IOL  . CESAREAN SECTION     x 2   . CHOLECYSTECTOMY    . EYE SURGERY    . GALLBLADDER SURGERY    . SHOULDER SURGERY  2006   right   . SHOULDER SURGERY Left 07/17/2013    bone spurs, and partial tear of rotator cuff  . TOTAL KNEE ARTHROPLASTY Left 08/14/2017   Procedure: LEFT TOTAL KNEE ARTHROPLASTY;  Surgeon: Vickey Huger, MD;  Location: [facility];  Service: Orthopedics;  Laterality: Left;  . TUBAL LIGATION      There were no vitals filed for this visit.  Subjective Assessment - 09/26/17 1314    Subjective  Reports going grocery shopping wears her out, c/o more foot/heel pain than knee pain'    Currently in Pain?  Yes    Pain Score  2     Pain Location  Knee    Pain Orientation  Left    Pain Descriptors / Indicators  Aching;Tightness    Aggravating Factors   standing and walking, bending         [facility] PT Assessment - 09/26/17 0001      PROM   Left Knee Flexion  114                  [facility] Adult PT Treatment/Exercise - 09/26/17 0001      Ambulation/Gait   Gait Comments  walk with patient around the building uneven surfaces, some cues for step length, needed HHA on the curbs and uneven terrain.  Needed one rest going around the building and then a rest when we came in      High Level Balance   High Level Balance Activities  Negotiating over obstacles    High Level Balance Comments  on airex balance head turns and ball tosses      Knee/Hip Exercises: Aerobic   Recumbent Bike  6 min seat #  3 full rev    Nustep  L4x6 mins      Manual Therapy   Manual Therapy  Passive ROM    Manual therapy comments  scar and patellar mobilization    Passive ROM  passive ROM to end range of knee flexion and extension with hold               PT Short Term Goals - 09/14/17 1355      PT SHORT TERM GOAL #1   Title  independent with initial HEP    Status  Achieved        PT Long Term Goals - 09/26/17 1351      PT LONG TERM GOAL #1   Title  ambulate without AD for functional gait and OQL    Status  Achieved      PT LONG TERM GOAL #2   Title  decrease pain 50% for ADLs    Status  Partially Met      PT LONG TERM GOAL #3   Title  increase knee AROM 0-120 for functional gait    Status  Partially Met      PT LONG TERM GOAL #4   Title  decrease edema 2cm for functional gait    Status  On-going      PT LONG TERM GOAL #5   Title  decrease TUG time <13 seconds for functional gait and safety    Status  Achieved      PT LONG TERM GOAL #6   Title  reports being able to participate fully in water aerobics without pain or limitation    Status  On-going      PT  LONG TERM GOAL #7   Title  increase overall knee strength 4/5 for functional gait    Status  On-going            Plan - 09/26/17 1350    Clinical Impression Statement  Very good PROM, some difficulty on uneven terrain and with stepping over objects.  C/O pain in the heels and plantar feet with longer periods of walking and standing    PT Next Visit Plan  continue to progress her gait and function as well as ROM    Consulted and Agree with Plan of Care  Patient       Patient will benefit from skilled therapeutic intervention in order to improve the following deficits and impairments:  Abnormal gait, Pain, Increased muscle spasms, Impaired tone, Decreased range of motion, Decreased endurance, Decreased activity tolerance, Decreased strength, Increased edema, Difficulty walking, Impaired flexibility  Visit Diagnosis: Difficulty in walking, not elsewhere classified  Acute postoperative pain of left knee  Localized edema  Stiffness of left knee, not elsewhere classified  Muscle weakness (generalized)     Problem List Patient Active Problem List   Diagnosis Date Noted  . 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 Acute bronchitis 04/02/2013  . COPD (chronic obstructive pulmonary disease) ([place]) 04/02/2013    Sumner Boast., PT 09/26/2017, 1:56 PM  [place] [address] [place], [name], [address] Phone: [phone]   Fax:  [phone]  Name: Emma [name] MRN: [id number] Date of Birth: 06-May-1941

## 2017-09-28 ENCOUNTER — Ambulatory Visit: Payer: Medicare Other | Admitting: Physical Therapy

## 2017-09-28 ENCOUNTER — Encounter: Payer: Self-pay | Admitting: Physical Therapy

## 2017-09-28 DIAGNOSIS — R600 Localized edema: Secondary | ICD-10-CM

## 2017-09-28 DIAGNOSIS — R262 Difficulty in walking, not elsewhere classified: Secondary | ICD-10-CM | POA: Diagnosis not present

## 2017-09-28 DIAGNOSIS — M25662 Stiffness of left knee, not elsewhere classified: Secondary | ICD-10-CM

## 2017-09-28 DIAGNOSIS — G8918 Other acute postprocedural pain: Secondary | ICD-10-CM

## 2017-09-28 DIAGNOSIS — M25562 Pain in left knee: Secondary | ICD-10-CM

## 2017-09-28 DIAGNOSIS — M6281 Muscle weakness (generalized): Secondary | ICD-10-CM

## 2017-09-28 NOTE — Therapy (Signed)
[place], [name], [address] Phone: [phone]   Fax:  [phone]  Physical Therapy Treatment  Patient Details  Name: Emma [name] MRN: [id number] Date of Birth: 1940-11-29 Referring Provider: Ronnie Derby   Encounter Date: 09/28/2017  PT End of Session - 09/28/17 1357    Visit Number  9    Date for PT Re-Evaluation  10/27/17    PT Start Time  1310    PT Stop Time  1410    PT Time Calculation (min)  60 min    Activity Tolerance  Patient tolerated treatment well    Behavior During Therapy  [facility] for tasks assessed/performed       Past Medical History:  Diagnosis Date  . Allergy   . Asthma    as child  . 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 pt. unsure of pathology  . Cataract   . Condyloma   . Constipation   . Hx of adenomatous colonic polyps   . Hypertension   . Hypothyroidism    2016- synthroid d/c'd   . Osteoarthritis    OA- knee- L knee, R thumb  . Osteopenia   . Thyroid disease   . Vitamin D deficiency     Past Surgical History:  Procedure Laterality Date  . BELPHAROPTOSIS REPAIR Bilateral 04/2015   vision correction  . BREAST BIOPSY     right   . CATARACT EXTRACTION, BILATERAL Bilateral 01/2014, 02/2014   /w IOL  . CESAREAN SECTION     x 2   . CHOLECYSTECTOMY    . EYE SURGERY    . GALLBLADDER SURGERY    . SHOULDER SURGERY  2006   right   . SHOULDER SURGERY Left 07/17/2013    bone spurs, and partial tear of rotator cuff  . TOTAL KNEE ARTHROPLASTY Left 08/14/2017   Procedure: LEFT TOTAL KNEE ARTHROPLASTY;  Surgeon: Vickey Huger, MD;  Location: [facility];  Service: Orthopedics;  Laterality: Left;  . TUBAL LIGATION      There were no vitals filed for this visit.  Subjective Assessment - 09/28/17 1318    Subjective  reports very stiff when she wakes up    Currently in Pain?  Yes    Pain Score  2     Pain  Location  Knee    Pain Orientation  Left                      [facility] Adult PT Treatment/Exercise - 09/28/17 0001      High Level Balance   High Level Balance Activities  Negotiating over obstacles    High Level Balance Comments  on airex balance head turns and ball tosses      Knee/Hip Exercises: Aerobic   Recumbent Bike  6 min seat #3 full rev    Nustep  L5x6 mins      Knee/Hip Exercises: Machines for Strengthening   Cybex Knee Flexion  20# 2x10      Knee/Hip Exercises: Standing   Knee Flexion  Strengthening;Left;2 sets;10 reps;Limitations    Knee Flexion Limitations  3#    Walking with Sports Cord  all directions x 65fet      Knee/Hip Exercises: Seated   Long Arc Quad  Strengthening;Left;2 sets;10 reps    Long Arc Quad Weight  3 lbs.    Other Seated Knee/Hip  Exercises  TKE red tband 2 sets 10      Knee/Hip Exercises: Supine   Other Supine Knee/Hip Exercises  feet on ball K2C, and bridges      Knee/Hip Exercises: Sidelying   Clams  15 each side      Vasopneumatic   Number Minutes Vasopneumatic   10 minutes    Vasopnuematic Location   Knee    Vasopneumatic Pressure  Low    Vasopneumatic Temperature   40      Manual Therapy   Manual Therapy  Passive ROM    Manual therapy comments  scar and patellar mobilization    Passive ROM  passive ROM to end range of knee flexion and extension with hold               PT Short Term Goals - 09/14/17 1355      PT SHORT TERM GOAL #1   Title  independent with initial HEP    Status  Achieved        PT Long Term Goals - 09/26/17 1351      PT LONG TERM GOAL #1   Title  ambulate without AD for functional gait and OQL    Status  Achieved      PT LONG TERM GOAL #2   Title  decrease pain 50% for ADLs    Status  Partially Met      PT LONG TERM GOAL #3   Title  increase knee AROM 0-120 for functional gait    Status  Partially Met      PT LONG TERM GOAL #4   Title  decrease edema 2cm for functional gait     Status  On-going      PT LONG TERM GOAL #5   Title  decrease TUG time <13 seconds for functional gait and safety    Status  Achieved      PT LONG TERM GOAL #6   Title  reports being able to participate fully in water aerobics without pain or limitation    Status  On-going      PT LONG TERM GOAL #7   Title  increase overall knee strength 4/5 for functional gait    Status  On-going            Plan - 09/28/17 1358    Clinical Impression Statement  Patient still very fearful on the airex or uneven surfaces.  She has very good ROM.  Needs more confidence    PT Next Visit Plan  continue to progress her gait and function as well as ROM    Consulted and Agree with Plan of Care  Patient       Patient will benefit from skilled therapeutic intervention in order to improve the following deficits and impairments:  Abnormal gait, Pain, Increased muscle spasms, Impaired tone, Decreased range of motion, Decreased endurance, Decreased activity tolerance, Decreased strength, Increased edema, Difficulty walking, Impaired flexibility  Visit Diagnosis: Difficulty in walking, not elsewhere classified  Acute postoperative pain of left knee  Localized edema  Stiffness of left knee, not elsewhere classified  Muscle weakness (generalized)     Problem List Patient Active Problem List   Diagnosis Date Noted  . 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 Acute bronchitis 04/02/2013  . COPD (chronic obstructive pulmonary disease) ([place]) 04/02/2013    Sumner Boast., PT 09/28/2017, 2:03 PM  [place], [name], [address] Phone:  [phone]   Fax:  [phone]  Name: Emma [name] MRN: [id number] Date of Birth: 26-May-1941

## 2017-10-03 ENCOUNTER — Ambulatory Visit: Payer: Medicare Other | Admitting: Physical Therapy

## 2017-10-03 ENCOUNTER — Encounter: Payer: Self-pay | Admitting: Physical Therapy

## 2017-10-03 DIAGNOSIS — M25562 Pain in left knee: Secondary | ICD-10-CM

## 2017-10-03 DIAGNOSIS — R262 Difficulty in walking, not elsewhere classified: Secondary | ICD-10-CM | POA: Diagnosis not present

## 2017-10-03 DIAGNOSIS — G8918 Other acute postprocedural pain: Secondary | ICD-10-CM

## 2017-10-03 DIAGNOSIS — M6281 Muscle weakness (generalized): Secondary | ICD-10-CM

## 2017-10-03 DIAGNOSIS — R600 Localized edema: Secondary | ICD-10-CM

## 2017-10-03 DIAGNOSIS — M25662 Stiffness of left knee, not elsewhere classified: Secondary | ICD-10-CM

## 2017-10-03 NOTE — Therapy (Signed)
[place], [name], [address] Phone: [phone]   Fax:  [phone]  Physical Therapy Treatment  Patient Details  Name: Emma [name] MRN: [id number] Date of Birth: [birth year] Referring Provider: Ronnie Derby   Encounter Date: 10/03/2017  PT End of Session - 10/03/17 1159    Visit Number  10    Date for PT Re-Evaluation  10/27/17    PT Start Time  1055    PT Stop Time  1200    PT Time Calculation (min)  65 min    Activity Tolerance  Patient tolerated treatment well    Behavior During Therapy  [facility] for tasks assessed/performed       Past Medical History:  Diagnosis Date  . Allergy   . Asthma    as child  . 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 pt. unsure of pathology  . Cataract   . Condyloma   . Constipation   . Hx of adenomatous colonic polyps   . Hypertension   . Hypothyroidism    2016- synthroid d/c'd   . Osteoarthritis    OA- knee- L knee, R thumb  . Osteopenia   . Thyroid disease   . Vitamin D deficiency     Past Surgical History:  Procedure Laterality Date  . BELPHAROPTOSIS REPAIR Bilateral 04/2015   vision correction  . BREAST BIOPSY     right   . CATARACT EXTRACTION, BILATERAL Bilateral 01/2014, 02/2014   /w IOL  . CESAREAN SECTION     x 2   . CHOLECYSTECTOMY    . EYE SURGERY    . GALLBLADDER SURGERY    . SHOULDER SURGERY  2006   right   . SHOULDER SURGERY Left 07/17/2013    bone spurs, and partial tear of rotator cuff  . TOTAL KNEE ARTHROPLASTY Left 08/14/2017   Procedure: LEFT TOTAL KNEE ARTHROPLASTY;  Surgeon: Vickey Huger, MD;  Location: [place];  Service: Orthopedics;  Laterality: Left;  . TUBAL LIGATION      There were no vitals filed for this visit.  Subjective Assessment - 10/03/17 1112    Subjective  Very stiff she says, reports that she feels like she is walking better    Currently in  Pain?  Yes    Pain Score  2     Pain Location  Knee    Pain Orientation  Left                No data recorded       [facility] Adult PT Treatment/Exercise - 10/03/17 0001      Ambulation/Gait   Gait Comments  walk with patient in the hall x 450' no rest, cues to keep a good pace, then worked on stairs, she really has an aversion to going down the stairs step over step, able to go up with effort but no pain, c/o pain going down but seemed to be more fear      Knee/Hip Exercises: Aerobic   Recumbent Bike  6 min seat #3 full rev    Nustep  L5x6 mins      Knee/Hip Exercises: Machines for Strengthening   Cybex Knee Extension  5# 2x10    Cybex Knee Flexion  20# 2x10    Cybex Leg Press  2x10 20#      Knee/Hip Exercises: Standing   Step Down  Step Height: 4";Step Height: 6";20 reps;Limitations    Step Down Limitations  used walker to hold onto, really worked on overcoming the fear and allowing it to bend, she did well with this and c/o less pain than on the stairs      Vasopneumatic   Number Minutes Vasopneumatic   10 minutes    Vasopnuematic Location   Knee    Vasopneumatic Pressure  Low    Vasopneumatic Temperature   40      Manual Therapy   Manual Therapy  Passive ROM    Manual therapy comments  scar and patellar mobilization    Passive ROM  passive ROM to end range of knee flexion and extension with hold               PT Short Term Goals - 09/14/17 1355      PT SHORT TERM GOAL #1   Title  independent with initial HEP    Status  Achieved        PT Long Term Goals - 09/26/17 1351      PT LONG TERM GOAL #1   Title  ambulate without AD for functional gait and OQL    Status  Achieved      PT LONG TERM GOAL #2   Title  decrease pain 50% for ADLs    Status  Partially Met      PT LONG TERM GOAL #3   Title  increase knee AROM 0-120 for functional gait    Status  Partially Met      PT LONG TERM GOAL #4   Title  decrease edema 2cm for functional gait     Status  On-going      PT LONG TERM GOAL #5   Title  decrease TUG time <13 seconds for functional gait and safety    Status  Achieved      PT LONG TERM GOAL #6   Title  reports being able to participate fully in water aerobics without pain or limitation    Status  On-going      PT LONG TERM GOAL #7   Title  increase overall knee strength 4/5 for functional gait    Status  On-going            Plan - 10/03/17 1159    Clinical Impression Statement  Patient having a great deal of difficulty going down the stairs step over step, fearful and pain.    PT Next Visit Plan  continue to progress her gait and function as well as ROM    Consulted and Agree with Plan of Care  Patient       Patient will benefit from skilled therapeutic intervention in order to improve the following deficits and impairments:  Abnormal gait, Pain, Increased muscle spasms, Impaired tone, Decreased range of motion, Decreased endurance, Decreased activity tolerance, Decreased strength, Increased edema, Difficulty walking, Impaired flexibility  Visit Diagnosis: Difficulty in walking, not elsewhere classified  Acute postoperative pain of left knee  Localized edema  Stiffness of left knee, not elsewhere classified  Muscle weakness (generalized)     Problem List Patient Active Problem List   Diagnosis Date Noted  . 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 Acute bronchitis 04/02/2013  . COPD (chronic obstructive pulmonary disease) ([place]) 04/02/2013    Sumner Boast., PT 10/03/2017, 12:00 PM  [place] [address] [place], [name], [address] Phone: [phone]   Fax:  [phone]  Name: Emma [name] MRN: [id number] Date of Birth: 03-07-1941

## 2017-10-04 ENCOUNTER — Ambulatory Visit: Payer: Medicare Other | Admitting: Nurse Practitioner

## 2017-10-05 ENCOUNTER — Ambulatory Visit: Payer: Medicare Other | Admitting: Physical Therapy

## 2017-10-05 ENCOUNTER — Encounter: Payer: Self-pay | Admitting: Physical Therapy

## 2017-10-05 DIAGNOSIS — R262 Difficulty in walking, not elsewhere classified: Secondary | ICD-10-CM

## 2017-10-05 DIAGNOSIS — G8918 Other acute postprocedural pain: Secondary | ICD-10-CM

## 2017-10-05 DIAGNOSIS — M25562 Pain in left knee: Secondary | ICD-10-CM

## 2017-10-05 DIAGNOSIS — M6281 Muscle weakness (generalized): Secondary | ICD-10-CM

## 2017-10-05 DIAGNOSIS — M25662 Stiffness of left knee, not elsewhere classified: Secondary | ICD-10-CM

## 2017-10-05 DIAGNOSIS — R600 Localized edema: Secondary | ICD-10-CM

## 2017-10-05 NOTE — Therapy (Signed)
[place], [name], [address] Phone: [phone]   Fax:  [phone]  Physical Therapy Treatment  Patient Details  Name: Emma [name] MRN: [id number] Date of Birth: 26-Oct-1940 Referring Provider: Ronnie Derby   Encounter Date: 10/05/2017  PT End of Session - 10/05/17 1437    Visit Number  11    Date for PT Re-Evaluation  10/27/17    PT Start Time  1400    PT Stop Time  1456    PT Time Calculation (min)  56 min    Activity Tolerance  Patient tolerated treatment well    Behavior During Therapy  [facility] for tasks assessed/performed       Past Medical History:  Diagnosis Date  . Allergy   . Asthma    as child  . 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 pt. unsure of pathology  . Cataract   . Condyloma   . Constipation   . Hx of adenomatous colonic polyps   . Hypertension   . Hypothyroidism    2016- synthroid d/c'd   . Osteoarthritis    OA- knee- L knee, R thumb  . Osteopenia   . Thyroid disease   . Vitamin D deficiency     Past Surgical History:  Procedure Laterality Date  . BELPHAROPTOSIS REPAIR Bilateral 04/2015   vision correction  . BREAST BIOPSY     right   . CATARACT EXTRACTION, BILATERAL Bilateral 01/2014, 02/2014   /w IOL  . CESAREAN SECTION     x 2   . CHOLECYSTECTOMY    . EYE SURGERY    . GALLBLADDER SURGERY    . SHOULDER SURGERY  2006   right   . SHOULDER SURGERY Left 07/17/2013    bone spurs, and partial tear of rotator cuff  . TOTAL KNEE ARTHROPLASTY Left 08/14/2017   Procedure: LEFT TOTAL KNEE ARTHROPLASTY;  Surgeon: Vickey Huger, MD;  Location: [place];  Service: Orthopedics;  Laterality: Left;  . TUBAL LIGATION      There were no vitals filed for this visit.  Subjective Assessment - 10/05/17 1407    Subjective  Patient reports that she has been more active, cleaning house, mopping and sweeping.    Currently in Pain?  Yes    Pain Score  3     Pain Location  Knee    Pain Orientation  Left    Pain Descriptors / Indicators  Sore    Aggravating Factors   cleaning         [facility] PT Assessment - 10/05/17 0001      AROM   Left Knee Extension  4    Left Knee Flexion  106            No data recorded       [facility] Adult PT Treatment/Exercise - 10/05/17 0001      High Level Balance   High Level Balance Comments  on airex balance head turns and ball tosses      Knee/Hip Exercises: Aerobic   Recumbent Bike  6 min seat #3 full rev    Nustep  L5x6 mins      Knee/Hip Exercises: Machines for Strengthening   Cybex Knee Extension  5# 2x10    Cybex Knee Flexion  15# 2x10    Cybex Leg Press  2x10 20#      Knee/Hip  Exercises: Standing   Step Down  Step Height: 4";Step Height: 6";20 reps;Limitations    Step Down Limitations  used walker to hold onto, really worked on overcoming the fear and allowing it to bend, she did well with this and c/o less pain than on the stairs    Walking with Sports Cord  all directions x 24fet      Vasopneumatic   Number Minutes Vasopneumatic   10 minutes    Vasopnuematic Location   Knee    Vasopneumatic Pressure  Low    Vasopneumatic Temperature   38               PT Short Term Goals - 09/14/17 1355      PT SHORT TERM GOAL #1   Title  independent with initial HEP    Status  Achieved        PT Long Term Goals - 10/05/17 1439      PT LONG TERM GOAL #4   Title  decrease edema 2cm for functional gait    Status  Partially Met      PT LONG TERM GOAL #6   Title  reports being able to participate fully in water aerobics without pain or limitation    Status  Partially Met            Plan - 10/05/17 1437    Clinical Impression Statement  Patient did not want to try stairs today, did well using walker and a 4" stair.  C/O pain with leg extension with 5#.  Has some balance issues with airex standing    PT Next Visit Plan   continue to progress her gait and function as well as ROM    Consulted and Agree with Plan of Care  Patient       Patient will benefit from skilled therapeutic intervention in order to improve the following deficits and impairments:  Abnormal gait, Pain, Increased muscle spasms, Impaired tone, Decreased range of motion, Decreased endurance, Decreased activity tolerance, Decreased strength, Increased edema, Difficulty walking, Impaired flexibility  Visit Diagnosis: Difficulty in walking, not elsewhere classified  Acute postoperative pain of left knee  Localized edema  Stiffness of left knee, not elsewhere classified  Muscle weakness (generalized)     Problem List Patient Active Problem List   Diagnosis Date Noted  . 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 Acute bronchitis 04/02/2013  . COPD [place] 04/02/2013    ASumner Boast, PT 10/05/2017, 2:40 PM  [address] [place] [address]: [phone]  Fax:  [phone] Name: Emma SIMMSMRN: [id number] of Birth: [birth year]

## 2017-10-09 ENCOUNTER — Ambulatory Visit: Payer: Medicare Other | Attending: Orthopedic Surgery | Admitting: Physical Therapy

## 2017-10-09 ENCOUNTER — Encounter: Payer: Self-pay | Admitting: Physical Therapy

## 2017-10-09 DIAGNOSIS — R600 Localized edema: Secondary | ICD-10-CM | POA: Diagnosis present

## 2017-10-09 DIAGNOSIS — M25562 Pain in left knee: Secondary | ICD-10-CM | POA: Diagnosis present

## 2017-10-09 DIAGNOSIS — M25662 Stiffness of left knee, not elsewhere classified: Secondary | ICD-10-CM

## 2017-10-09 DIAGNOSIS — R262 Difficulty in walking, not elsewhere classified: Secondary | ICD-10-CM | POA: Diagnosis present

## 2017-10-09 DIAGNOSIS — M6281 Muscle weakness (generalized): Secondary | ICD-10-CM

## 2017-10-09 DIAGNOSIS — G8918 Other acute postprocedural pain: Secondary | ICD-10-CM

## 2017-10-09 NOTE — Therapy (Signed)
[place], [name], [address] Phone: [phone]   Fax:  [phone]  Physical Therapy Treatment  Patient Details  Name: Emma [name] MRN: [id number] Date of Birth: 1940-11-29 Referring Provider: Ronnie Derby   Encounter Date: 10/09/2017  PT End of Session - 10/09/17 1539    Visit Number  12    Date for PT Re-Evaluation  10/27/17    PT Start Time  1445    PT Stop Time  1546    PT Time Calculation (min)  61 min    Activity Tolerance  Patient tolerated treatment well    Behavior During Therapy  [facility] for tasks assessed/performed       Past Medical History:  Diagnosis Date  . Allergy   . Asthma    as child  . 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 pt. unsure of pathology  . Cataract   . Condyloma   . Constipation   . Hx of adenomatous colonic polyps   . Hypertension   . Hypothyroidism    2016- synthroid d/c'd   . Osteoarthritis    OA- knee- L knee, R thumb  . Osteopenia   . Thyroid disease   . Vitamin D deficiency     Past Surgical History:  Procedure Laterality Date  . BELPHAROPTOSIS REPAIR Bilateral 04/2015   vision correction  . BREAST BIOPSY     right   . CATARACT EXTRACTION, BILATERAL Bilateral 01/2014, 02/2014   /w IOL  . CESAREAN SECTION     x 2   . CHOLECYSTECTOMY    . EYE SURGERY    . GALLBLADDER SURGERY    . SHOULDER SURGERY  2006   right   . SHOULDER SURGERY Left 07/17/2013    bone spurs, and partial tear of rotator cuff  . TOTAL KNEE ARTHROPLASTY Left 08/14/2017   Procedure: LEFT TOTAL KNEE ARTHROPLASTY;  Surgeon: Vickey Huger, MD;  Location: [place];  Service: Orthopedics;  Laterality: Left;  . TUBAL LIGATION      There were no vitals filed for this visit.  Subjective Assessment - 10/09/17 1452    Subjective  Pt reports some L knee pain when she woke up    Currently in Pain?  No/denies    Pain Score   0-No pain                       [facility] Adult PT Treatment/Exercise - 10/09/17 0001      Knee/Hip Exercises: Aerobic   Recumbent Bike  6 min seat #3 full rev    Nustep  L5x6 mins      Knee/Hip Exercises: Machines for Strengthening   Cybex Knee Extension  5# 2x10    Cybex Knee Flexion  15# 2x10    Cybex Leg Press  2x10 20#      Knee/Hip Exercises: Standing   Forward Step Up  Step Height: 6";Hand Hold: 2;Left;1 set;10 reps      Knee/Hip Exercises: Seated   Sit to Sand  2 sets;10 reps;without UE support holding red ball      Knee/Hip Exercises: Supine   Short Arc Quad Sets  Left;10 reps;2 sets 2lb      Modalities   Modalities  Vasopneumatic      Vasopneumatic   Number Minutes Vasopneumatic   10 minutes    Vasopnuematic Location  Knee    Vasopneumatic Pressure  Low    Vasopneumatic Temperature   32      Manual Therapy   Manual Therapy  Passive ROM    Passive ROM  passive ROM to end range of knee flexion and extension with hold               PT Short Term Goals - 09/14/17 1355      PT SHORT TERM GOAL #1   Title  independent with initial HEP    Status  Achieved        PT Long Term Goals - 10/09/17 1543      PT LONG TERM GOAL #2   Title  decrease pain 50% for ADLs    Status  Partially Met            Plan - 10/09/17 1541    Clinical Impression Statement  Progressing towards goals. L knee pain with seated leg extensions, some compensation with 6 in step ups. Pt L knee lacks full extension noted with SAQ.    Rehab Potential  Good    PT Frequency  2x / week    PT Duration  8 weeks    PT Treatment/Interventions  Cryotherapy;Electrical Stimulation;Therapeutic activities;Therapeutic exercise;Patient/family education;Stair training;Gait training;Neuromuscular re-education;Scar mobilization;Passive range of motion;Manual techniques;Vasopneumatic Device;Visual/perceptual remediation/compensation    PT Next Visit Plan  continue to progress her  gait and function as well as ROM       Patient will benefit from skilled therapeutic intervention in order to improve the following deficits and impairments:  Abnormal gait, Pain, Increased muscle spasms, Impaired tone, Decreased range of motion, Decreased endurance, Decreased activity tolerance, Decreased strength, Increased edema, Difficulty walking, Impaired flexibility  Visit Diagnosis: Difficulty in walking, not elsewhere classified  Acute postoperative pain of left knee  Localized edema  Stiffness of left knee, not elsewhere classified  Muscle weakness (generalized)     Problem List Patient Active Problem List   Diagnosis Date Noted  . 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 Acute bronchitis 04/02/2013  . COPD (chronic obstructive pulmonary disease) ([place]) 04/02/2013    Scot Jun, PTA 10/09/2017, 3:59 PM  [address] [place], [name], [address] Phone: [phone]   Fax:  [phone]  Name: Emma [name] MRN: [id number] Date of Birth: Feb 27, 1941

## 2017-10-11 ENCOUNTER — Encounter: Payer: Self-pay | Admitting: Obstetrics and Gynecology

## 2017-10-11 ENCOUNTER — Other Ambulatory Visit: Payer: Self-pay

## 2017-10-11 ENCOUNTER — Ambulatory Visit (INDEPENDENT_AMBULATORY_CARE_PROVIDER_SITE_OTHER): Payer: Medicare Other | Admitting: Obstetrics and Gynecology

## 2017-10-11 VITALS — BP 126/60 | HR 84 | Resp 16 | Ht 59.0 in | Wt 142.0 lb

## 2017-10-11 DIAGNOSIS — Z01419 Encounter for gynecological examination (general) (routine) without abnormal findings: Secondary | ICD-10-CM

## 2017-10-11 DIAGNOSIS — M8580 Other specified disorders of bone density and structure, unspecified site: Secondary | ICD-10-CM

## 2017-10-11 DIAGNOSIS — Z124 Encounter for screening for malignant neoplasm of cervix: Secondary | ICD-10-CM | POA: Diagnosis not present

## 2017-10-11 NOTE — Progress Notes (Signed)
77 y.o. [id number] DivorcedCaucasianF here for annual exam. She just had a left knee replacement in early in 2/19.  Slowly recovering. PT says she is doing great.  No vaginal bleeding. Not sexually active for years. She voids 0-2 x at night.     Patient's last menstrual period was 12/09/1997 (approximate).          Sexually active: No.  The current method of family planning is post menopausal status.    Exercising: No.  The patient does not participate in regular exercise at present. Smoker:  Former smoker, quit 2 months ago, smoked a few cigarettes a day.   Health Maintenance: Pap:  09-16-14 WNL  History of abnormal Pap:  H/O CIN I/VAIN I in the 90's MMG:  10-14-16 WNL  Colonoscopy:  01-29-14 polyps, was told no more colonoscopies.   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 FRAX 12% for any fracture and 3.6% for hip fracture Was on Evista many year past TDaP: 2016   Gardasil: N/A   reports that she quit smoking about 2 months ago. Her smoking use included cigarettes. She has a 12.50 pack-year smoking history. She has never used smokeless tobacco. She reports that she drinks about 3.0 - 3.6 oz of alcohol per week. She reports that she does not use drugs.  2 kids, both local. 5 grand children.   Past Medical History:  Diagnosis Date  . Allergy   . Asthma    as child  . 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 pt. unsure of pathology  . Cataract   . Condyloma   . Constipation   . Hx of adenomatous colonic polyps   . Hypertension   . Hypothyroidism    2016- synthroid d/c'd   . Osteoarthritis    OA- knee- L knee, R thumb  . Osteopenia   . Thyroid disease   . Vitamin D deficiency     Past Surgical History:  Procedure Laterality Date  . BELPHAROPTOSIS REPAIR Bilateral 04/2015   vision correction  . BREAST BIOPSY     right   . CATARACT EXTRACTION, BILATERAL Bilateral 01/2014, 02/2014    /w IOL  . CESAREAN SECTION     x 2   . CHOLECYSTECTOMY    . EYE SURGERY    . GALLBLADDER SURGERY    . SHOULDER SURGERY  2006   right   . SHOULDER SURGERY Left 07/17/2013    bone spurs, and partial tear of rotator cuff  . TOTAL KNEE ARTHROPLASTY Left 08/14/2017   Procedure: LEFT TOTAL KNEE ARTHROPLASTY;  Surgeon: Vickey Huger, MD;  Location: [facility];  Service: Orthopedics;  Laterality: Left;  . TUBAL LIGATION      Current Outpatient Medications  Medication Sig Dispense Refill  . acetaminophen (TYLENOL) 500 MG tablet Take 1,000 mg by mouth every 6 (six) hours as needed for mild pain or moderate pain.    Marland Kitchen ADVAIR DISKUS 250-50 MCG/DOSE AEPB Inhale 1 puff into the lungs daily.     . cholecalciferol (VITAMIN D) 1000 units tablet Take 1,000 Units by mouth daily.    . folic acid (FOLVITE) 1 MG tablet Take 1 mg by mouth daily.    Marland Kitchen gabapentin (NEURONTIN) 300 MG capsule Take 300 mg by mouth at bedtime.     . indapamide (LOZOL) 2.5 MG tablet Take 5 mg by mouth daily.     Marland Kitchen  KLOR-CON M20 20 MEQ tablet Take 40 mEq by mouth 3 (three) times daily.     . methotrexate (RHEUMATREX) 2.5 MG tablet Take 2.5-5 mg by mouth once a week. Sunday    . montelukast (SINGULAIR) 10 MG tablet Take 10 mg by mouth at bedtime as needed. Once daily    . Propylene Glycol (SYSTANE BALANCE OP) Place 1 drop into both eyes daily.    . Vitamin D, Ergocalciferol, (DRISDOL) 50000 UNITS CAPS Take 50,000 Units by mouth every 7 (seven) days. Sunday     No current facility-administered medications for this visit.     Family History  Problem Relation Age of Onset  . Heart disease Father   . Diabetes Paternal Grandfather   . Asthma Son   . Colon cancer Neg Hx     Review of Systems  Constitutional: Negative.   HENT: Negative.   Eyes: Negative.   Respiratory: Negative.   Cardiovascular: Negative.   Gastrointestinal: Negative.   Endocrine: Negative.   Genitourinary: Negative.   Musculoskeletal: Negative.   Skin:  Negative.   Allergic/Immunologic: Negative.   Neurological: Negative.   Psychiatric/Behavioral: Negative.     Exam:   BP 126/60 (BP Location: Right Arm, Patient Position: Sitting, Cuff Size: Normal)   Pulse 84   Resp 16   Ht 4\' 11"  (1.499 m)   Wt 142 lb (64.4 kg)   LMP 12/09/1997 (Approximate)   BMI 28.68 kg/m   Weight change: @WEIGHTCHANGE @ Height:   Height: 4\' 11"  (149.9 cm)  Ht Readings from Last 3 Encounters:  10/11/17 4\' 11"  (1.499 m)  08/14/17 4' 11.5" (1.511 m)  08/08/17 4' 11.5" (1.511 m)    General appearance: alert, cooperative and appears stated age Head: Normocephalic, without obvious abnormality, atraumatic Neck: no adenopathy, supple, symmetrical, trachea midline and thyroid normal to inspection and palpation Lungs: clear to auscultation bilaterally Cardiovascular: regular rate and rhythm Breasts: normal appearance, no masses or tenderness, evidence of right lumpectomy (benign) Abdomen: soft, non-tender; non distended,  no masses,  no organomegaly Extremities: extremities normal, atraumatic, no cyanosis or edema Skin: Skin color, texture, turgor normal. No rashes or lesions Lymph nodes: Cervical, supraclavicular, and axillary nodes normal. No abnormal inguinal nodes palpated Neurologic: Grossly normal   Pelvic: External genitalia:  no lesions              Urethra:  normal appearing urethra with no masses, tenderness or lesions              Bartholins and Skenes: normal                 Vagina: normal appearing atrophic vagina with normal color and discharge, no lesions              Cervix: no lesions               Bimanual Exam:  Uterus:  normal size, contour, position, consistency, mobility, non-tender              Adnexa: no mass, fullness, tenderness               Rectovaginal: Confirms               Anus:  normal sphincter tone, no lesions  Chaperone was present for exam.  A:  Well Woman with normal exam  Osteopenia with elevated risk of  fracture  P:   Vit d (other labs are normal)  Discussed recommendations for treating her with medication for her elevated FRAX  risk of 3.6% for a hip fracture  Patient given information on osteoporosis and treatment  DEXA next year  Calcium 1,200 mg a day  Pap with reflex hpv  Mammogram due  Was told no more colonoscopies were needed  Labs with primary

## 2017-10-11 NOTE — Patient Instructions (Signed)

## 2017-10-12 ENCOUNTER — Telehealth: Payer: Self-pay | Admitting: Obstetrics and Gynecology

## 2017-10-12 ENCOUNTER — Encounter: Payer: Self-pay | Admitting: Physical Therapy

## 2017-10-12 ENCOUNTER — Ambulatory Visit: Payer: Medicare Other | Admitting: Physical Therapy

## 2017-10-12 DIAGNOSIS — R262 Difficulty in walking, not elsewhere classified: Secondary | ICD-10-CM

## 2017-10-12 DIAGNOSIS — R600 Localized edema: Secondary | ICD-10-CM

## 2017-10-12 DIAGNOSIS — M25662 Stiffness of left knee, not elsewhere classified: Secondary | ICD-10-CM

## 2017-10-12 DIAGNOSIS — G8918 Other acute postprocedural pain: Secondary | ICD-10-CM

## 2017-10-12 DIAGNOSIS — M6281 Muscle weakness (generalized): Secondary | ICD-10-CM

## 2017-10-12 DIAGNOSIS — M25562 Pain in left knee: Secondary | ICD-10-CM

## 2017-10-12 LAB — VITAMIN D 25 HYDROXY (VIT D DEFICIENCY, FRACTURES): VIT D 25 HYDROXY: 61.9 ng/mL (ref 30.0–100.0)

## 2017-10-12 NOTE — Telephone Encounter (Signed)
VOID

## 2017-10-12 NOTE — Therapy (Signed)
[place], [name], [address] Phone: [phone]   Fax:  [phone]  Physical Therapy Treatment  Patient Details  Name: Emma [name] MRN: [id number] Date of Birth: 1941-03-24 Referring Provider: Ronnie Derby   Encounter Date: 10/12/2017  PT End of Session - 10/12/17 1402    Visit Number  13    Date for PT Re-Evaluation  10/27/17    PT Start Time  1310    PT Stop Time  1355    PT Time Calculation (min)  45 min    Activity Tolerance  Patient tolerated treatment well    Behavior During Therapy  [facility] for tasks assessed/performed       Past Medical History:  Diagnosis Date  . Allergy   . Asthma    as child  . 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 pt. unsure of pathology  . Cataract   . Condyloma   . Constipation   . Hx of adenomatous colonic polyps   . Hypertension   . Hypothyroidism    2016- synthroid d/c'd   . Osteoarthritis    OA- knee- L knee, R thumb  . Osteopenia   . Thyroid disease   . Vitamin D deficiency     Past Surgical History:  Procedure Laterality Date  . BELPHAROPTOSIS REPAIR Bilateral 04/2015   vision correction  . BREAST BIOPSY     right   . CATARACT EXTRACTION, BILATERAL Bilateral 01/2014, 02/2014   /w IOL  . CESAREAN SECTION     x 2   . CHOLECYSTECTOMY    . EYE SURGERY    . GALLBLADDER SURGERY    . SHOULDER SURGERY  2006   right   . SHOULDER SURGERY Left 07/17/2013    bone spurs, and partial tear of rotator cuff  . TOTAL KNEE ARTHROPLASTY Left 08/14/2017   Procedure: LEFT TOTAL KNEE ARTHROPLASTY;  Surgeon: Vickey Huger, MD;  Location: [facility];  Service: Orthopedics;  Laterality: Left;  . TUBAL LIGATION      There were no vitals filed for this visit.  Subjective Assessment - 10/12/17 1316    Subjective  Continues to reports some left knee stiffness and soreness, asks about returning to water  aerobics    Currently in Pain?  No/denies                       [facility] Adult PT Treatment/Exercise - 10/12/17 0001      Ambulation/Gait   Gait Comments  walk with patient in the hall x 450' no rest, cues to keep a good pace, then worked on stairs, she really has an aversion to going down the stairs step over step, able to go up with effort but no pain, c/o pain going down but seemed to be more fear      Knee/Hip Exercises: Aerobic   Recumbent Bike  6 min seat #3 full rev    Nustep  L5x6 mins      Knee/Hip Exercises: Machines for Strengthening   Cybex Knee Extension  5# 2x10    Cybex Knee Flexion  20# 2x15    Cybex Leg Press  2x10 20#, then no weight going low, then left leg only no weight      Knee/Hip Exercises: Standing   Hip Abduction  2 sets;10 reps;Left    Abduction Limitations  3#  Hip Extension  2 sets;10 reps;Left    Extension Limitations  3#    Walking with Sports Cord  all directions x 17fet               PT Short Term Goals - 09/14/17 1355      PT SHORT TERM GOAL #1   Title  independent with initial HEP    Status  Achieved        PT Long Term Goals - 10/12/17 1403      PT LONG TERM GOAL #3   Title  increase knee AROM 0-120 for functional gait    Status  Partially Met            Plan - 10/12/17 1403    Clinical Impression Statement  Patient still a little sore and stiff, reports when she gets up in the AM she has some difficulty walking.  She has difficulty reciprocally on the stairs needing some cues.    PT Next Visit Plan  work on stairs and TKE    Consulted and Agree with Plan of Care  Patient       Patient will benefit from skilled therapeutic intervention in order to improve the following deficits and impairments:  Abnormal gait, Pain, Increased muscle spasms, Impaired tone, Decreased range of motion, Decreased endurance, Decreased activity tolerance, Decreased strength, Increased edema, Difficulty walking, Impaired  flexibility  Visit Diagnosis: Difficulty in walking, not elsewhere classified  Acute postoperative pain of left knee  Localized edema  Stiffness of left knee, not elsewhere classified  Muscle weakness (generalized)     Problem List Patient Active Problem List   Diagnosis Date Noted  . 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 Acute bronchitis 04/02/2013  . COPD (chronic obstructive pulmonary disease) ([place] 04/02/2013    ASumner Boast, PT 10/12/2017, 2:04 PM  [address] [address]Phone: [phone]  Fax:  [phone] Name: Emma DUIGNANMRN: [id number] of Birth: [birth year]

## 2017-10-13 ENCOUNTER — Other Ambulatory Visit (HOSPITAL_COMMUNITY)
Admission: RE | Admit: 2017-10-13 | Discharge: 2017-10-13 | Disposition: A | Discharge status: Home / Self Care | Payer: Medicare Other | Source: Ambulatory Visit | Attending: Obstetrics and Gynecology | Admitting: Obstetrics and Gynecology

## 2017-10-13 ENCOUNTER — Telehealth: Payer: Self-pay | Admitting: Obstetrics and Gynecology

## 2017-10-13 DIAGNOSIS — Z01419 Encounter for gynecological examination (general) (routine) without abnormal findings: Secondary | ICD-10-CM | POA: Diagnosis present

## 2017-10-13 NOTE — Telephone Encounter (Signed)
Patient called to return call from [facility] about lab results.

## 2017-10-13 NOTE — Telephone Encounter (Signed)
Patient states she is still reading through material Dr.Jertson gave her yesterday and trying to decide if she wants to begin taking medication for osteopenia with elevated FRAX risk. She will call back next week to let Dr.Jertson/Elaine know.

## 2017-10-13 NOTE — Addendum Note (Signed)
Addended by: Dorothy Spark on: 10/13/2017 02:01 PM   Modules accepted: Orders

## 2017-10-16 ENCOUNTER — Ambulatory Visit: Payer: Medicare Other | Admitting: Physical Therapy

## 2017-10-16 ENCOUNTER — Encounter: Payer: Self-pay | Admitting: Physical Therapy

## 2017-10-16 DIAGNOSIS — R262 Difficulty in walking, not elsewhere classified: Secondary | ICD-10-CM | POA: Diagnosis not present

## 2017-10-16 DIAGNOSIS — G8918 Other acute postprocedural pain: Secondary | ICD-10-CM

## 2017-10-16 DIAGNOSIS — M25562 Pain in left knee: Secondary | ICD-10-CM

## 2017-10-16 DIAGNOSIS — M6281 Muscle weakness (generalized): Secondary | ICD-10-CM

## 2017-10-16 DIAGNOSIS — M25662 Stiffness of left knee, not elsewhere classified: Secondary | ICD-10-CM

## 2017-10-16 DIAGNOSIS — R600 Localized edema: Secondary | ICD-10-CM

## 2017-10-16 NOTE — Therapy (Signed)
[place], [name], [address] Phone: [phone]   Fax:  [phone]  Physical Therapy Treatment  Patient Details  Name: Emma [name] MRN: [id number] Date of Birth: 03/17/1941 Referring Provider: Ronnie Derby   Encounter Date: 10/16/2017  PT End of Session - 10/16/17 1458    Visit Number  14    Date for PT Re-Evaluation  10/27/17    PT Start Time  1400    PT Stop Time  1457    PT Time Calculation (min)  57 min    Activity Tolerance  Patient tolerated treatment well    Behavior During Therapy  [facility] for tasks assessed/performed       Past Medical History:  Diagnosis Date  . Allergy   . Asthma    as child  . 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 pt. unsure of pathology  . Cataract   . Condyloma   . Constipation   . Hx of adenomatous colonic polyps   . Hypertension   . Hypothyroidism    2016- synthroid d/c'd   . Osteoarthritis    OA- knee- L knee, R thumb  . Osteopenia   . Thyroid disease   . Vitamin D deficiency     Past Surgical History:  Procedure Laterality Date  . BELPHAROPTOSIS REPAIR Bilateral 04/2015   vision correction  . BREAST BIOPSY     right   . CATARACT EXTRACTION, BILATERAL Bilateral 01/2014, 02/2014   /w IOL  . CESAREAN SECTION     x 2   . CHOLECYSTECTOMY    . EYE SURGERY    . GALLBLADDER SURGERY    . SHOULDER SURGERY  2006   right   . SHOULDER SURGERY Left 07/17/2013    bone spurs, and partial tear of rotator cuff  . TOTAL KNEE ARTHROPLASTY Left 08/14/2017   Procedure: LEFT TOTAL KNEE ARTHROPLASTY;  Surgeon: Vickey Huger, MD;  Location: [facility];  Service: Orthopedics;  Laterality: Left;  . TUBAL LIGATION      There were no vitals filed for this visit.  Subjective Assessment - 10/16/17 1408    Subjective  Pt c/o of left knee pain and reports that she had difficulty with balance and stiffness on  awakening in the morning.     Currently in Pain?  Yes    Pain Score  3     Pain Location  Knee    Pain Orientation  Left                       [facility] Adult PT Treatment/Exercise - 10/16/17 0001      Knee/Hip Exercises: Aerobic   Recumbent Bike  6 min L2    Nustep  L5 x 6 min      Knee/Hip Exercises: Machines for Strengthening   Cybex Knee Extension  5# 1x10    Cybex Knee Flexion  20# 2x10    Cybex Leg Press  2x10 20#, then no weight going low, then left leg only no weight      Knee/Hip Exercises: Standing   Hip Abduction  Both;2 sets;5 reps    Abduction Limitations  3#    Hip Extension  2 sets;10 reps    Extension Limitations  3#    Forward Step Up  Both;1 set;10 reps;Hand Hold: 2;Step Height: 6" verbal cues needed to activate quad  on step up    Other Standing Knee Exercises  STS 2x10 reps               PT Short Term Goals - 09/14/17 1355      PT SHORT TERM GOAL #1   Title  independent with initial HEP    Status  Achieved        PT Long Term Goals - 10/12/17 1403      PT LONG TERM GOAL #3   Title  increase knee AROM 0-120 for functional gait    Status  Partially Met            Plan - 10/16/17 1459    Clinical Impression Statement  Pt is sore and stiff in L knee. She c/o of some palpable tenderness on anterior knee. Pt needed verbal cues to activate quad when doing therapeutic exercises.    PT Frequency  2x / week    PT Duration  8 weeks    PT Treatment/Interventions  Cryotherapy;Electrical Stimulation;Therapeutic activities;Therapeutic exercise;Patient/family education;Stair training;Gait training;Neuromuscular re-education;Scar mobilization;Passive range of motion;Manual techniques;Vasopneumatic Device;Visual/perceptual remediation/compensation    PT Next Visit Plan  continue to work on stairs and TKE       Patient will benefit from skilled therapeutic intervention in order to improve the following deficits and impairments:  Abnormal  gait, Pain, Increased muscle spasms, Impaired tone, Decreased range of motion, Decreased endurance, Decreased activity tolerance, Decreased strength, Increased edema, Difficulty walking, Impaired flexibility  Visit Diagnosis: Difficulty in walking, not elsewhere classified  Acute postoperative pain of left knee  Localized edema  Stiffness of left knee, not elsewhere classified  Muscle weakness (generalized)     Problem List Patient Active Problem List   Diagnosis Date Noted  . 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 Acute bronchitis 04/02/2013  . COPD (chronic obstructive pulmonary disease) ([place]) 04/02/2013    Sumner Boast., PT 10/16/2017, 3:09 PM  [place] [address] [place], [name], [address] Phone: [phone]   Fax:  [phone]  Name: SHONNA DEITER MRN: [id number] Date of Birth: November 03, 1940

## 2017-10-17 LAB — CYTOLOGY - PAP: DIAGNOSIS: NEGATIVE

## 2017-10-19 ENCOUNTER — Encounter: Payer: Self-pay | Admitting: Physical Therapy

## 2017-10-19 ENCOUNTER — Ambulatory Visit: Payer: Medicare Other | Admitting: Physical Therapy

## 2017-10-19 DIAGNOSIS — G8918 Other acute postprocedural pain: Secondary | ICD-10-CM

## 2017-10-19 DIAGNOSIS — R262 Difficulty in walking, not elsewhere classified: Secondary | ICD-10-CM

## 2017-10-19 DIAGNOSIS — M25662 Stiffness of left knee, not elsewhere classified: Secondary | ICD-10-CM

## 2017-10-19 DIAGNOSIS — M25562 Pain in left knee: Secondary | ICD-10-CM

## 2017-10-19 DIAGNOSIS — M6281 Muscle weakness (generalized): Secondary | ICD-10-CM

## 2017-10-19 DIAGNOSIS — R600 Localized edema: Secondary | ICD-10-CM

## 2017-10-19 NOTE — Therapy (Signed)
[place], [name], [address] Phone: [phone]   Fax:  [phone]  Physical Therapy Treatment  Patient Details  Name: Emma [name] MRN: [id number] Date of Birth: [birth year] Referring Provider: Ronnie Derby   Encounter Date: 10/19/2017  PT End of Session - 10/19/17 1152    Visit Number  15    Date for PT Re-Evaluation  10/27/17    PT Start Time  1100 ice    PT Stop Time  1152    PT Time Calculation (min)  52 min    Activity Tolerance  Patient tolerated treatment well    Behavior During Therapy  [facility] for tasks assessed/performed       Past Medical History:  Diagnosis Date  . Allergy   . Asthma    as child  . 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 pt. unsure of pathology  . Cataract   . Condyloma   . Constipation   . Hx of adenomatous colonic polyps   . Hypertension   . Hypothyroidism    2016- synthroid d/c'd   . Osteoarthritis    OA- knee- L knee, R thumb  . Osteopenia   . Thyroid disease   . Vitamin D deficiency     Past Surgical History:  Procedure Laterality Date  . BELPHAROPTOSIS REPAIR Bilateral 04/2015   vision correction  . BREAST BIOPSY     right   . CATARACT EXTRACTION, BILATERAL Bilateral 01/2014, 02/2014   /w IOL  . CESAREAN SECTION     x 2   . CHOLECYSTECTOMY    . EYE SURGERY    . GALLBLADDER SURGERY    . SHOULDER SURGERY  2006   right   . SHOULDER SURGERY Left 07/17/2013    bone spurs, and partial tear of rotator cuff  . TOTAL KNEE ARTHROPLASTY Left 08/14/2017   Procedure: LEFT TOTAL KNEE ARTHROPLASTY;  Surgeon: Vickey Huger, MD;  Location: [place];  Service: Orthopedics;  Laterality: Left;  . TUBAL LIGATION      There were no vitals filed for this visit.  Subjective Assessment - 10/19/17 1117    Subjective  Patient reports that she went to water aerobics, reports did not do as well as she  hoped, reports she is sore and stiff and was not able to do the moves    Currently in Pain?  Yes    Pain Score  4     Pain Location  Knee    Pain Orientation  Left    Aggravating Factors   I over did it with the water aerobics         [facility] PT Assessment - 10/19/17 0001      AROM   Left Knee Flexion  114      PROM   Left Knee Flexion  117                   [facility] Adult PT Treatment/Exercise - 10/19/17 0001      Knee/Hip Exercises: Stretches   Gastroc Stretch  3 reps;10 seconds      Knee/Hip Exercises: Aerobic   Recumbent Bike  6 min L0, had to regress to level 0 due to stiffness, unable to keep set RPM's      Knee/Hip Exercises: Machines for Strengthening   Cybex Knee Extension  5# 1x10    Cybex Knee Flexion  20# 2x15    Cybex Leg Press  2x10 20#, then no weight going low, then left leg only no weight      Knee/Hip Exercises: Standing   Hip Extension  2 sets;10 reps    Extension Limitations  3#    Forward Step Up  Both;1 set;10 reps;Hand Hold: 2;Step Height: 6"    Walking with Sports Cord  all directions x 73fet    Other Standing Knee Exercises  SLS holding 5-10 seconds 2x10      Cryotherapy   Number Minutes Cryotherapy  10 Minutes    Cryotherapy Location  Knee    Type of Cryotherapy  Ice pack      Manual Therapy   Manual Therapy  Passive ROM    Passive ROM  passive ROM to end range of knee flexion and extension with hold               PT Short Term Goals - 09/14/17 1355      PT SHORT TERM GOAL #1   Title  independent with initial HEP    Status  Achieved        PT Long Term Goals - 10/19/17 1156      PT LONG TERM GOAL #2   Title  decrease pain 50% for ADLs    Status  Partially Met      PT LONG TERM GOAL #3   Title  increase knee AROM 0-120 for functional gait    Status  Partially Met      PT LONG TERM GOAL #6   Title  reports being able to participate fully in water aerobics without pain or limitation    Status  Partially Met             Plan - 10/19/17 1152    Clinical Impression Statement  Patient reports that she is very sore today, she reports going to water aerobics and trying to do all of the moves, she reports frustration in that she could not do many of the things that they were doing but also frustration that she is very sore., Her ROM is improving very well, she is tight and tender in the lateral knee and thigh.    PT Next Visit Plan  continue to work on stairs and TKE and her working on return to gym on own safely    Consulted and Agree with Plan of Care  Patient       Patient will benefit from skilled therapeutic intervention in order to improve the following deficits and impairments:  Abnormal gait, Pain, Increased muscle spasms, Impaired tone, Decreased range of motion, Decreased endurance, Decreased activity tolerance, Decreased strength, Increased edema, Difficulty walking, Impaired flexibility  Visit Diagnosis: Difficulty in walking, not elsewhere classified  Acute postoperative pain of left knee  Localized edema  Stiffness of left knee, not elsewhere classified  Muscle weakness (generalized)     Problem List Patient Active Problem List   Diagnosis Date Noted  . 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 Acute bronchitis 04/02/2013  . COPD [place] 04/[place]    ASumner Boast, PT 10/19/2017, 11:57 AM  [address] [address]Phone: [phone]  Fax:  [phone] Name: JSERA HITSMANMRN: [id number] of Birth: [birth year]

## 2017-10-23 ENCOUNTER — Encounter: Payer: Self-pay | Admitting: Physical Therapy

## 2017-10-23 ENCOUNTER — Ambulatory Visit: Payer: Medicare Other | Admitting: Physical Therapy

## 2017-10-23 DIAGNOSIS — R262 Difficulty in walking, not elsewhere classified: Secondary | ICD-10-CM | POA: Diagnosis not present

## 2017-10-23 DIAGNOSIS — M25662 Stiffness of left knee, not elsewhere classified: Secondary | ICD-10-CM

## 2017-10-23 DIAGNOSIS — G8918 Other acute postprocedural pain: Secondary | ICD-10-CM

## 2017-10-23 DIAGNOSIS — R600 Localized edema: Secondary | ICD-10-CM

## 2017-10-23 DIAGNOSIS — M6281 Muscle weakness (generalized): Secondary | ICD-10-CM

## 2017-10-23 DIAGNOSIS — M25562 Pain in left knee: Secondary | ICD-10-CM

## 2017-10-23 NOTE — Therapy (Signed)
[place], [name], [address] Phone: [phone]   Fax:  [phone]  Physical Therapy Treatment  Patient Details  Name: Emma [name] MRN: [id number] Date of Birth: 14-Jan-1941 Referring Provider: Ronnie Derby   Encounter Date: 10/23/2017  PT End of Session - 10/23/17 1136    Visit Number  16    Date for PT Re-Evaluation  10/27/17    PT Start Time  1055    PT Stop Time  1140    PT Time Calculation (min)  45 min    Activity Tolerance  Patient tolerated treatment well    Behavior During Therapy  [facility] for tasks assessed/performed       Past Medical History:  Diagnosis Date  . Allergy   . Asthma    as child  . 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 pt. unsure of pathology  . Cataract   . Condyloma   . Constipation   . Hx of adenomatous colonic polyps   . Hypertension   . Hypothyroidism    2016- synthroid d/c'd   . Osteoarthritis    OA- knee- L knee, R thumb  . Osteopenia   . Thyroid disease   . Vitamin D deficiency     Past Surgical History:  Procedure Laterality Date  . BELPHAROPTOSIS REPAIR Bilateral 04/2015   vision correction  . BREAST BIOPSY     right   . CATARACT EXTRACTION, BILATERAL Bilateral 01/2014, 02/2014   /w IOL  . CESAREAN SECTION     x 2   . CHOLECYSTECTOMY    . EYE SURGERY    . GALLBLADDER SURGERY    . SHOULDER SURGERY  2006   right   . SHOULDER SURGERY Left 07/17/2013    bone spurs, and partial tear of rotator cuff  . TOTAL KNEE ARTHROPLASTY Left 08/14/2017   Procedure: LEFT TOTAL KNEE ARTHROPLASTY;  Surgeon: Vickey Huger, MD;  Location: [place];  Service: Orthopedics;  Laterality: Left;  . TUBAL LIGATION      There were no vitals filed for this visit.  Subjective Assessment - 10/23/17 1100    Subjective  REports that she went back to H2O aerobics but toned it down and feels better.  Still some  soreness and stifness    Currently in Pain?  Yes    Pain Score  2     Pain Location  Knee    Pain Orientation  Left    Pain Descriptors / Indicators  Sore    Pain Relieving Factors  stretches         [facility] PT Assessment - 10/23/17 0001      AROM   Left Knee Extension  5    Left Knee Flexion  114      PROM   Left Knee Extension  0    Left Knee Flexion  117                   [facility] Adult PT Treatment/Exercise - 10/23/17 0001      Knee/Hip Exercises: Stretches   Gastroc Stretch  3 reps;10 seconds      Knee/Hip Exercises: Aerobic   Recumbent Bike  6 mins, still very stiff at postition #3    Nustep  L5 x 6 min      Knee/Hip Exercises: Machines for Strengthening   Cybex Knee Extension  5#  2x10    Cybex Knee Flexion  20# 2x15    Cybex Leg Press  2x10 20#, then no weight going low, then left leg only no weight, tried 20# single leg could not do      Knee/Hip Exercises: Seated   Long Arc Quad  Strengthening;Left;2 sets;10 reps    Long Arc Quad Weight  5 lbs.      Knee/Hip Exercises: Supine   Short Arc Quad Sets  Left;2 sets;10 reps;Limitations    Short Arc Quad Sets Limitations  5#      Manual Therapy   Manual Therapy  Passive ROM    Passive ROM  passive ROM to end range of knee flexion and extension with hold               PT Short Term Goals - 09/14/17 1355      PT SHORT TERM GOAL #1   Title  independent with initial HEP    Status  Achieved        PT Long Term Goals - 10/23/17 1141      PT LONG TERM GOAL #1   Title  ambulate without AD for functional gait and OQL    Status  Achieved      PT LONG TERM GOAL #2   Title  decrease pain 50% for ADLs    Status  Achieved      PT LONG TERM GOAL #3   Title  increase knee AROM 0-120 for functional gait    Status  Partially Met      PT LONG TERM GOAL #4   Title  decrease edema 2cm for functional gait    Status  Partially Met      PT LONG TERM GOAL #5   Title  decrease TUG time <13 seconds  for functional gait and safety    Status  Achieved      PT LONG TERM GOAL #6   Title  reports being able to participate fully in water aerobics without pain or limitation    Status  Partially Met      PT LONG TERM GOAL #7   Title  increase overall knee strength 4/5 for functional gait    Status  Partially Met            Plan - 10/23/17 1137    Clinical Impression Statement  Overall patient has done very good with her recovery s/p left TKA.  She has just now resumed water aerobics, this has caused some stiffness and fatrigue.  She is having the most difficulty with getting TKE, has tightness posterior knee, she is using the foam at home to help this.    PT Next Visit Plan  Patient overall doing well, she is starting to try to resume her originial activities.  Feel that she could gain some increased ROM and function if we are to continue    Consulted and Agree with Plan of Care  Patient       Patient will benefit from skilled therapeutic intervention in order to improve the following deficits and impairments:  Abnormal gait, Pain, Increased muscle spasms, Impaired tone, Decreased range of motion, Decreased endurance, Decreased activity tolerance, Decreased strength, Increased edema, Difficulty walking, Impaired flexibility  Visit Diagnosis: Difficulty in walking, not elsewhere classified  Acute postoperative pain of left knee  Localized edema  Stiffness of left knee, not elsewhere classified  Muscle weakness (generalized)     Problem List Patient Active Problem List   Diagnosis  Date Noted  . 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 Acute bronchitis 04/02/2013  . COPD (chronic obstructive pulmonary disease) ([place]) 04/02/2013    Sumner Boast., PT 10/23/2017, 11:42 AM  [place] [address] [place], [name], [address] Phone: [phone]   Fax:  [phone]  Name: Emma [name] MRN:  [id number] Date of Birth: 1941/02/03

## 2017-10-25 ENCOUNTER — Telehealth: Payer: Self-pay | Admitting: Obstetrics and Gynecology

## 2017-10-25 NOTE — Telephone Encounter (Signed)
Spoke with patient. Results given. Patient verbalizes understanding.   Notes recorded by Nicholes Rough, LPN on 0/08/2334 at 12:24 PM EDT AEX 10-31-18 -eh ------  Notes recorded by Salvadore Dom, MD on 10/17/2017 at 12:19 PM EDT 02 recall  Routing to provider for final review. Patient agreeable to disposition. Will close encounter.

## 2017-10-25 NOTE — Telephone Encounter (Signed)
Patient calling for pap results from 10/11/17.

## 2017-10-26 ENCOUNTER — Encounter: Payer: Self-pay | Admitting: Physical Therapy

## 2017-10-26 ENCOUNTER — Ambulatory Visit: Payer: Medicare Other | Admitting: Physical Therapy

## 2017-10-26 DIAGNOSIS — M25562 Pain in left knee: Secondary | ICD-10-CM

## 2017-10-26 DIAGNOSIS — R262 Difficulty in walking, not elsewhere classified: Secondary | ICD-10-CM

## 2017-10-26 DIAGNOSIS — R600 Localized edema: Secondary | ICD-10-CM

## 2017-10-26 DIAGNOSIS — G8918 Other acute postprocedural pain: Secondary | ICD-10-CM

## 2017-10-26 DIAGNOSIS — M6281 Muscle weakness (generalized): Secondary | ICD-10-CM

## 2017-10-26 DIAGNOSIS — M25662 Stiffness of left knee, not elsewhere classified: Secondary | ICD-10-CM

## 2017-10-26 NOTE — Therapy (Signed)
[place], [name], [address] Phone: [phone]   Fax:  [phone]  Physical Therapy Treatment  Patient Details  Name: Emma [name] MRN: [id number] Date of Birth: 1940/07/21 Referring Provider: Ronnie Derby   Encounter Date: 10/26/2017  PT End of Session - 10/26/17 1443    Visit Number  17    Date for PT Re-Evaluation  10/27/17    PT Start Time  1315    PT Stop Time  1410    PT Time Calculation (min)  55 min    Activity Tolerance  Patient tolerated treatment well    Behavior During Therapy  [facility] for tasks assessed/performed       Past Medical History:  Diagnosis Date  . Allergy   . Asthma    as child  . 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 pt. unsure of pathology  . Cataract   . Condyloma   . Constipation   . Hx of adenomatous colonic polyps   . Hypertension   . Hypothyroidism    2016- synthroid d/c'd   . Osteoarthritis    OA- knee- L knee, R thumb  . Osteopenia   . Thyroid disease   . Vitamin D deficiency     Past Surgical History:  Procedure Laterality Date  . BELPHAROPTOSIS REPAIR Bilateral 04/2015   vision correction  . BREAST BIOPSY     right   . CATARACT EXTRACTION, BILATERAL Bilateral 01/2014, 02/2014   /w IOL  . CESAREAN SECTION     x 2   . CHOLECYSTECTOMY    . EYE SURGERY    . GALLBLADDER SURGERY    . SHOULDER SURGERY  2006   right   . SHOULDER SURGERY Left 07/17/2013    bone spurs, and partial tear of rotator cuff  . TOTAL KNEE ARTHROPLASTY Left 08/14/2017   Procedure: LEFT TOTAL KNEE ARTHROPLASTY;  Surgeon: Vickey Huger, MD;  Location: [place];  Service: Orthopedics;  Laterality: Left;  . TUBAL LIGATION      There were no vitals filed for this visit.  Subjective Assessment - 10/26/17 1324    Subjective  Patient reports seeing MD on Tuesday, MD was pleased.  Feels like she could continue with  the PT to get back to her PLOF    Currently in Pain?  Yes    Pain Score  2     Pain Location  Knee    Pain Orientation  Left    Aggravating Factors   reports that she feels like the machines really help her the most                       [facility] Adult PT Treatment/Exercise - 10/26/17 0001      Ambulation/Gait   Gait Comments  gait brisk pace, cues to pick up feet and bend knee, tandem gait, marches      Knee/Hip Exercises: Stretches   Press photographer  3 reps;20 seconds      Knee/Hip Exercises: Aerobic   Recumbent Bike  6 mins, still very stiff at postition #3    Nustep  L5 x 6 min      Knee/Hip Exercises: Machines for Strengthening   Cybex Knee Extension  5# 2x10, cues to get TKE, having some right patellar pain    Cybex Knee Flexion  25# 2x15  Cybex Leg Press  2x10 30#, then no weight left only 2x10      Knee/Hip Exercises: Standing   Walking with Sports Cord  all directions x 46fet      Knee/Hip Exercises: Supine   Short Arc Quad Sets  Left;2 sets;10 reps;Limitations    Short Arc Quad Sets Limitations  5#      Cryotherapy   Number Minutes Cryotherapy  10 Minutes    Cryotherapy Location  Knee    Type of Cryotherapy  Ice pack               PT Short Term Goals - 09/14/17 1355      PT SHORT TERM GOAL #1   Title  independent with initial HEP    Status  Achieved        PT Long Term Goals - 10/23/17 1141      PT LONG TERM GOAL #1   Title  ambulate without AD for functional gait and OQL    Status  Achieved      PT LONG TERM GOAL #2   Title  decrease pain 50% for ADLs    Status  Achieved      PT LONG TERM GOAL #3   Title  increase knee AROM 0-120 for functional gait    Status  Partially Met      PT LONG TERM GOAL #4   Title  decrease edema 2cm for functional gait    Status  Partially Met      PT LONG TERM GOAL #5   Title  decrease TUG time <13 seconds for functional gait and safety    Status  Achieved      PT LONG TERM GOAL #6    Title  reports being able to participate fully in water aerobics without pain or limitation    Status  Partially Met      PT LONG TERM GOAL #7   Title  increase overall knee strength 4/5 for functional gait    Status  Partially Met            Plan - 10/26/17 1444    Clinical Impression Statement  Started talking to patient about what she could try at hte gym, she reports tha tshe does not want to do the gym, likes the water aerobics.  I spokw with her about our gym program, she wants to think about this    PT Next Visit Plan  problem solve after PT program with her    Consulted and Agree with Plan of Care  Patient       Patient will benefit from skilled therapeutic intervention in order to improve the following deficits and impairments:  Abnormal gait, Pain, Increased muscle spasms, Impaired tone, Decreased range of motion, Decreased endurance, Decreased activity tolerance, Decreased strength, Increased edema, Difficulty walking, Impaired flexibility  Visit Diagnosis: Difficulty in walking, not elsewhere classified  Acute postoperative pain of left knee  Localized edema  Stiffness of left knee, not elsewhere classified  Muscle weakness (generalized)     Problem List Patient Active Problem List   Diagnosis Date Noted  . 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 Acute bronchitis 04/02/2013  . COPD (ch[place] disease) [place] 04/02/2013    ASumner Boast, PT 10/26/2017, 2:48 PM  [address] [address]Phone: [phone]  Fax:  [phone] Name: Emma MERIWEATHERMRN: [id number] of Birth:  08/28/1940   

## 2017-10-31 ENCOUNTER — Ambulatory Visit: Payer: Medicare Other | Admitting: Physical Therapy

## 2017-11-02 ENCOUNTER — Encounter: Payer: Self-pay | Admitting: Physical Therapy

## 2017-11-02 ENCOUNTER — Ambulatory Visit: Payer: Medicare Other | Admitting: Physical Therapy

## 2017-11-02 DIAGNOSIS — M25562 Pain in left knee: Secondary | ICD-10-CM

## 2017-11-02 DIAGNOSIS — M25662 Stiffness of left knee, not elsewhere classified: Secondary | ICD-10-CM

## 2017-11-02 DIAGNOSIS — R262 Difficulty in walking, not elsewhere classified: Secondary | ICD-10-CM

## 2017-11-02 DIAGNOSIS — G8918 Other acute postprocedural pain: Secondary | ICD-10-CM

## 2017-11-02 DIAGNOSIS — M6281 Muscle weakness (generalized): Secondary | ICD-10-CM

## 2017-11-02 DIAGNOSIS — R600 Localized edema: Secondary | ICD-10-CM

## 2017-11-02 NOTE — Therapy (Signed)
[facility] [address] [place], [place], [address] Phone: [phone]   Fax:  [phone]  Physical Therapy Treatment  Patient Details  Name: Emma [name] MRN: [id number] Date of Birth: 07/12/1940 Referring Provider: Lucey   Encounter Date: 11/02/2017  PT End of Session - 11/02/17 1345    Visit Number  18    Date for PT Re-Evaluation  10/27/17    PT Start Time  1302    PT Stop Time  1355    PT Time Calculation (min)  53 min    Activity Tolerance  Patient tolerated treatment well    Behavior During Therapy  [facility] for tasks assessed/performed       Past Medical History:  Diagnosis Date  . Allergy   . Asthma    as child  . 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 Jordan removed ? pt. unsure of pathology  . Cataract   . Condyloma   . Constipation   . Hx of adenomatous colonic polyps   . Hypertension   . Hypothyroidism    2016- synthroid d/c'd   . Osteoarthritis    OA- knee- L knee, R thumb  . Osteopenia   . Thyroid disease   . Vitamin D deficiency     Past Surgical History:  Procedure Laterality Date  . BELPHAROPTOSIS REPAIR Bilateral 04/2015   vision correction  . BREAST BIOPSY     right   . CATARACT EXTRACTION, BILATERAL Bilateral 01/2014, 02/2014   /w IOL  . CESAREAN SECTION     x 2   . CHOLECYSTECTOMY    . EYE SURGERY    . GALLBLADDER SURGERY    . SHOULDER SURGERY  2006   right   . SHOULDER SURGERY Left 07/17/2013    bone spurs, and partial tear of rotator cuff  . TOTAL KNEE ARTHROPLASTY Left 08/14/2017   Procedure: LEFT TOTAL KNEE ARTHROPLASTY;  Surgeon: Lucey, Steve, MD;  Location[facility];  Service: Orthopedics;  Laterality: Left;  . TUBAL LIGATION      There were no vitals filed for this visit.  Subjective Assessment - 11/02/17 1308    Subjective  "Im ok" "I went to water aerobics this morning"    Currently in Pain?  Yes    Pain Score   2     Pain Location  Knee                       [facility] Adult PT Treatment/Exercise - 11/02/17 0001      Ambulation/Gait   Stairs  Yes    Stairs Assistance  5: Supervision;4: Min guard    Stair Management Technique  One rail Left;Alternating pattern    Number of Stairs  122    Height of Stairs  6    Gait Comments  L patella pain during desents.      Knee/Hip Exercises: Aerobic   Recumbent Bike  5 mins, still very stiff at postition #3    Nustep  L2 x 4 min No UE      Knee/Hip Exercises: Machines for Strengthening   Cybex Knee Extension  5# 2x10, cues to get TKE, having some right patellar pain    Cybex Knee Flexion  25# 2x15    Cybex Leg Press  2x10 30#, then no weight left only 2x10      Knee/Hip Exercises: Standing     Walking with Sports Cord  all directions x 69fet      Vasopneumatic   Number Minutes Vasopneumatic   10 minutes    Vasopnuematic Location   Knee    Vasopneumatic Pressure  Low    Vasopneumatic Temperature   32               PT Short Term Goals - 09/14/17 1355      PT SHORT TERM GOAL #1   Title  independent with initial HEP    Status  Achieved        PT Long Term Goals - 10/23/17 1141      PT LONG TERM GOAL #1   Title  ambulate without AD for functional gait and OQL    Status  Achieved      PT LONG TERM GOAL #2   Title  decrease pain 50% for ADLs    Status  Achieved      PT LONG TERM GOAL #3   Title  increase knee AROM 0-120 for functional gait    Status  Partially Met      PT LONG TERM GOAL #4   Title  decrease edema 2cm for functional gait    Status  Partially Met      PT LONG TERM GOAL #5   Title  decrease TUG time <13 seconds for functional gait and safety    Status  Achieved      PT LONG TERM GOAL #6   Title  reports being able to participate fully in water aerobics without pain or limitation    Status  Partially Met      PT LONG TERM GOAL #7   Title  increase overall knee strength 4/5 for functional gait     Status  Partially Met            Plan - 11/02/17 1346    Clinical Impression Statement  Pt continues to do water aerobics. She was able to do all of today's activities but does report some L knee pain with excentric loads. Leg press at a lower position some struggle but able to complete. Some pain also reported L patella with knee extensions.    PT Duration  8 weeks    PT Treatment/Interventions  Cryotherapy;Electrical Stimulation;Therapeutic activities;Therapeutic exercise;Patient/family education;Stair training;Gait training;Neuromuscular re-education;Scar mobilization;Passive range of motion;Manual techniques;Vasopneumatic Device;Visual/perceptual remediation/compensation    PT Next Visit Plan  problem solve after PT program with her       Patient will benefit from skilled therapeutic intervention in order to improve the following deficits and impairments:  Abnormal gait, Pain, Increased muscle spasms, Impaired tone, Decreased range of motion, Decreased endurance, Decreased activity tolerance, Decreased strength, Increased edema, Difficulty walking, Impaired flexibility  Visit Diagnosis: Localized edema  Stiffness of left knee, not elsewhere classified  Muscle weakness (generalized)  Difficulty in walking, not elsewhere classified  Acute postoperative pain of left knee     Problem List Patient Active Problem List   Diagnosis Date Noted  . 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 Acute bronchitis 04/02/2013  . COPD (chronic obstructive pulmonary disease) [place] 04/02/2013    RScot Jun PTA 11/02/2017, 1:53 PM  [address] [address]Phone: [phone]  Fax:  [phone] Name: JRAYGEN LINQUISTMRN: [id number] of Birth: 104-May-1942

## 2017-11-06 ENCOUNTER — Encounter: Payer: Self-pay | Admitting: Physical Therapy

## 2017-11-06 ENCOUNTER — Ambulatory Visit: Payer: Medicare Other | Admitting: Physical Therapy

## 2017-11-06 DIAGNOSIS — M6281 Muscle weakness (generalized): Secondary | ICD-10-CM

## 2017-11-06 DIAGNOSIS — M25662 Stiffness of left knee, not elsewhere classified: Secondary | ICD-10-CM

## 2017-11-06 DIAGNOSIS — R262 Difficulty in walking, not elsewhere classified: Secondary | ICD-10-CM

## 2017-11-06 NOTE — Therapy (Signed)
[place], [name], [address] Phone: [phone]   Fax:  [phone]  Physical Therapy Treatment  Patient Details  Name: Emma [name] MRN: [id number] Date of Birth: 01/17/1941 Referring Provider: Ronnie Derby   Encounter Date: 11/06/2017  PT End of Session - 11/06/17 1354    Visit Number  19    Date for PT Re-Evaluation  11/26/17    PT Start Time  1310    PT Stop Time  1355    PT Time Calculation (min)  45 min    Activity Tolerance  Patient tolerated treatment well    Behavior During Therapy  [facility] for tasks assessed/performed       Past Medical History:  Diagnosis Date  . Allergy   . Asthma    as child  . 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 pt. unsure of pathology  . Cataract   . Condyloma   . Constipation   . Hx of adenomatous colonic polyps   . Hypertension   . Hypothyroidism    2016- synthroid d/c'd   . Osteoarthritis    OA- knee- L knee, R thumb  . Osteopenia   . Thyroid disease   . Vitamin D deficiency     Past Surgical History:  Procedure Laterality Date  . BELPHAROPTOSIS REPAIR Bilateral 04/2015   vision correction  . BREAST BIOPSY     right   . CATARACT EXTRACTION, BILATERAL Bilateral 01/2014, 02/2014   /w IOL  . CESAREAN SECTION     x 2   . CHOLECYSTECTOMY    . EYE SURGERY    . GALLBLADDER SURGERY    . SHOULDER SURGERY  2006   right   . SHOULDER SURGERY Left 07/17/2013    bone spurs, and partial tear of rotator cuff  . TOTAL KNEE ARTHROPLASTY Left 08/14/2017   Procedure: LEFT TOTAL KNEE ARTHROPLASTY;  Surgeon: Vickey Huger, MD;  Location: [employer];  Service: Orthopedics;  Laterality: Left;  . TUBAL LIGATION      There were no vitals filed for this visit.  Subjective Assessment - 11/06/17 1314    Subjective  Patient reports that she was very active over the weekend, "a lot of walking"  Reports that  the thing that gives her the most difficulty is steps, "up and down"    Currently in Pain?  Yes    Pain Score  2     Pain Location  Knee    Pain Orientation  Left    Pain Descriptors / Indicators  Sore    Aggravating Factors   steps and walking                       [facility] Adult PT Treatment/Exercise - 11/06/17 0001      Ambulation/Gait   Gait Comments  up and down staris step over step 2 flights with some cues      Knee/Hip Exercises: Stretches   Gastroc Stretch  3 reps;20 seconds      Knee/Hip Exercises: Aerobic   Recumbent Bike  5 minutes    Nustep  L4 x 5 min No UE      Knee/Hip Exercises: Machines for Strengthening   Cybex Knee Extension  5# 2x10, cues to get TKE, having some right patellar pain, then did left leg eccentrics    Cybex Knee Flexion  25# 2x15    Cybex Leg Press  2x10 30#, then no weight left only 2x10, tried sets of 5 with left leg only and 20#      Knee/Hip Exercises: Standing   Lateral Step Up  Step Height: 4";15 reps;Left    Forward Step Up  Step Height: 4";20 reps;Left    Step Down  Step Height: 4";15 reps;Left      Knee/Hip Exercises: Seated   Long Arc Quad  Left;20 reps;Limitations    Long Arc Quad Limitations  cues needed for [employer]               PT Short Term Goals - 09/14/17 1355      PT SHORT TERM GOAL #1   Title  independent with initial HEP    Status  Achieved        PT Long Term Goals - 10/23/17 1141      PT LONG TERM GOAL #1   Title  ambulate without AD for functional gait and OQL    Status  Achieved      PT LONG TERM GOAL #2   Title  decrease pain 50% for ADLs    Status  Achieved      PT LONG TERM GOAL #3   Title  increase knee AROM 0-120 for functional gait    Status  Partially Met      PT LONG TERM GOAL #4   Title  decrease edema 2cm for functional gait    Status  Partially Met      PT LONG TERM GOAL #5   Title  decrease TUG time <13 seconds for functional gait and safety    Status  Achieved       PT LONG TERM GOAL #6   Title  reports being able to participate fully in water aerobics without pain or limitation    Status  Partially Met      PT LONG TERM GOAL #7   Title  increase overall knee strength 4/5 for functional gait    Status  Partially Met            Plan - 11/06/17 1355    Clinical Impression Statement  Biggest issue is eccentric control of the left leg on stairs and TKE, overall she is doing very well.  Will work with her on Friday for advanced HEP and D/C    PT Next Visit Plan  advanced HEP and D/C    Consulted and Agree with Plan of Care  Patient       Patient will benefit from skilled therapeutic intervention in order to improve the following deficits and impairments:  Abnormal gait, Pain, Increased muscle spasms, Impaired tone, Decreased range of motion, Decreased endurance, Decreased activity tolerance, Decreased strength, Increased edema, Difficulty walking, Impaired flexibility  Visit Diagnosis: Stiffness of left knee, not elsewhere classified  Muscle weakness (generalized)  Difficulty in walking, not elsewhere classified     Problem List Patient Active Problem List   Diagnosis Date Noted  . 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 Acute bronchitis 04/02/2013  . COPD (chronic obstructive pulmonary disease) ([place]) 04/02/2013    Sumner Boast., PT 11/06/2017, 1:56 PM  [place] [address] [place], [name], [address] Phone: [phone]   Fax:  [phone]  Name: Emma [name] MRN: [id number] Date of Birth: Apr 18, 1941

## 2017-11-10 ENCOUNTER — Encounter: Payer: Self-pay | Admitting: Physical Therapy

## 2017-11-10 ENCOUNTER — Ambulatory Visit: Payer: Medicare Other | Attending: Orthopedic Surgery | Admitting: Physical Therapy

## 2017-11-10 DIAGNOSIS — M25562 Pain in left knee: Secondary | ICD-10-CM | POA: Diagnosis present

## 2017-11-10 DIAGNOSIS — R262 Difficulty in walking, not elsewhere classified: Secondary | ICD-10-CM | POA: Insufficient documentation

## 2017-11-10 DIAGNOSIS — M6281 Muscle weakness (generalized): Secondary | ICD-10-CM

## 2017-11-10 DIAGNOSIS — G8918 Other acute postprocedural pain: Secondary | ICD-10-CM

## 2017-11-10 DIAGNOSIS — R600 Localized edema: Secondary | ICD-10-CM | POA: Insufficient documentation

## 2017-11-10 DIAGNOSIS — M25662 Stiffness of left knee, not elsewhere classified: Secondary | ICD-10-CM | POA: Diagnosis present

## 2017-11-10 NOTE — Therapy (Signed)
[place], [name], [address] Phone: [phone]   Fax:  [phone]  Physical Therapy Treatment  Patient Details  Name: Emma [name] MRN: [id number] Date of Birth: 02-16-1941 Referring Provider: Ronnie Derby   Encounter Date: 11/10/2017  PT End of Session - 11/10/17 1155    Visit Number  20    Date for PT Re-Evaluation  11/26/17    PT Start Time  46    PT Stop Time  1150    PT Time Calculation (min)  50 min    Activity Tolerance  Patient tolerated treatment well    Behavior During Therapy  [facility] for tasks assessed/performed       Past Medical History:  Diagnosis Date   Allergy    Asthma    as child   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 pt. unsure of pathology   Cataract    Condyloma    Constipation    Hx of adenomatous colonic polyps    Hypertension    Hypothyroidism    2016- synthroid d/c'd    Osteoarthritis    OA- knee- L knee, R thumb   Osteopenia    Thyroid disease    Vitamin D deficiency     Past Surgical History:  Procedure Laterality Date   BELPHAROPTOSIS REPAIR Bilateral 04/2015   vision correction   BREAST BIOPSY     right    CATARACT EXTRACTION, BILATERAL Bilateral 01/2014, 02/2014   /w IOL   CESAREAN SECTION     x 2    [place]  2006   right    SHOULDER SURGERY Left 07/17/2013    bone spurs, and partial tear of rotator cuff   TOTAL KNEE ARTHROPLASTY Left 08/14/2017   Procedure: LEFT TOTAL KNEE ARTHROPLASTY;  Surgeon: Vickey Huger, MD;  Location: [place];  Service: Orthopedics;  Laterality: Left;   TUBAL LIGATION      There were no vitals filed for this visit.  Subjective Assessment - 11/10/17 1154    Subjective  Still has some difficulty with descending stairs.  Hip/ buttock pain with increased walking.                        [place] Adult PT Treatment/Exercise - 11/10/17 0001      Ambulation/Gait   Stairs  Yes    Stairs Assistance  5: Supervision    Stair Management Technique  One rail Right    Number of Stairs  12    Height of Stairs  7    Gait Comments  up and down staris step over step 2 flights with some cues      Knee/Hip Exercises: Stretches   Other Knee/Hip Stretches  piriformis stretch 3x30"ea b      Knee/Hip Exercises: Aerobic   Nustep  L4 x 5 min No UE      Knee/Hip Exercises: Standing   Lateral Step Up  Left;10 reps;Hand Hold: 1;Step Height: 8"    Forward Step Up  Left;10 reps;Step Height: 8";Hand Hold: 1    Functional Squat  2 sets;10 reps    Other Standing Knee Exercises  3 way standing SLR red 1x10ea b    Other Standing Knee Exercises  SLS holding 5-10 seconds 2x10  Knee/Hip Exercises: Supine   Bridges  Strengthening;2 sets;10 reps               PT Short Term Goals - 09/14/17 1355      PT SHORT TERM GOAL #1   Title  independent with initial HEP    Status  Achieved        PT Long Term Goals - 10/23/17 1141      PT LONG TERM GOAL #1   Title  ambulate without AD for functional gait and OQL    Status  Achieved      PT LONG TERM GOAL #2   Title  decrease pain 50% for ADLs    Status  Achieved      PT LONG TERM GOAL #3   Title  increase knee AROM 0-120 for functional gait    Status  Partially Met      PT LONG TERM GOAL #4   Title  decrease edema 2cm for functional gait    Status  Partially Met      PT LONG TERM GOAL #5   Title  decrease TUG time <13 seconds for functional gait and safety    Status  Achieved      PT LONG TERM GOAL #6   Title  reports being able to participate fully in water aerobics without pain or limitation    Status  Partially Met      PT LONG TERM GOAL #7   Title  increase overall knee strength 4/5 for functional gait    Status  Partially Met            Plan - 11/10/17 1156    Clinical  Impression Statement  Able to reciprocally negotiate ascending and descending stairs. Increased dependence on hand rail with descent       Patient will benefit from skilled therapeutic intervention in order to improve the following deficits and impairments:     Visit Diagnosis: Muscle weakness (generalized)  Stiffness of left knee, not elsewhere classified  Difficulty in walking, not elsewhere classified  Localized edema  Acute postoperative pain of left knee     Problem List Patient Active Problem List   Diagnosis Date Noted   S/P total knee replacement 08/14/2017   Acute bronchitis 04/02/2013   COPD (chronic obstructive pulmonary disease) ([place]) 04/02/2013    Olean Ree, PTA 11/10/2017, 11:59 AM  [place] [address] [place], [name], [address] Phone: [phone]   Fax:  [phone]  Name: Emma [name] MRN: [id number] Date of Birth: [birth year]

## 2017-11-13 ENCOUNTER — Encounter: Payer: Self-pay | Admitting: Obstetrics and Gynecology

## 2017-12-25 ENCOUNTER — Ambulatory Visit: Payer: Medicare Other | Admitting: Obstetrics and Gynecology

## 2017-12-25 ENCOUNTER — Other Ambulatory Visit: Payer: Self-pay

## 2017-12-25 ENCOUNTER — Telehealth: Payer: Self-pay | Admitting: Obstetrics and Gynecology

## 2017-12-25 ENCOUNTER — Encounter: Payer: Self-pay | Admitting: Obstetrics and Gynecology

## 2017-12-25 VITALS — BP 118/60 | HR 88 | Resp 16 | Wt 148.0 lb

## 2017-12-25 DIAGNOSIS — R300 Dysuria: Secondary | ICD-10-CM

## 2017-12-25 DIAGNOSIS — N3090 Cystitis, unspecified without hematuria: Secondary | ICD-10-CM

## 2017-12-25 LAB — POCT URINALYSIS DIPSTICK
Bilirubin, UA: NEGATIVE
GLUCOSE UA: NEGATIVE
KETONES UA: NEGATIVE
Protein, UA: NEGATIVE
UROBILINOGEN UA: NEGATIVE U/dL — AB
pH, UA: 6 (ref 5.0–8.0)

## 2017-12-25 MED ORDER — SULFAMETHOXAZOLE-TRIMETHOPRIM 800-160 MG PO TABS: 1.0000 | ORAL_TABLET | Freq: Two times a day (BID) | ORAL | 0 refills | Status: DC

## 2017-12-25 NOTE — Telephone Encounter (Signed)
Spoke with patient. Patient states that yesterday she began having urinary frequency and burning. Started taking OTC AZO. Woke up this morning with back pain. Denies any fever or chills. Appointment scheduled for today at 3:45 pm with Dr.Jertson. Patient is agreeable to date and time. Encounter closed.

## 2017-12-25 NOTE — Addendum Note (Signed)
Addended by: Susanne Greenhouse E on: 12/25/2017 04:40 PM   Modules accepted: Orders

## 2017-12-25 NOTE — Progress Notes (Signed)
GYNECOLOGY  VISIT   HPI: 77 y.o.   Divorced  Caucasian  female   G2P2002 with Patient's last menstrual period was 12/09/1997 (approximate).   here c/o dysuria and urinary frequency. Symptoms started yesterday. Voiding small amounts, slight dysuria. No flank pain or fever, some lower back pain. She took azo yesterday which helped. She has GSI, leaking is not worse.      GYNECOLOGIC HISTORY: Patient's last menstrual period was 12/09/1997 (approximate). Contraception:postmenopause Menopausal hormone therapy: none        OB History    Gravida  2   Para  2   Term  2   Preterm      AB      Living  2     SAB      TAB      Ectopic      Multiple      Live Births                 Patient Active Problem List   Diagnosis Date Noted  . 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 Acute bronchitis 04/02/2013  . COPD (chronic obstructive pulmonary disease) ([place]) 04/02/2013    Past Medical History:  Diagnosis Date  . Allergy   . Asthma    as child  . 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 pt. unsure of pathology  . Cataract   . Condyloma   . Constipation   . Hx of adenomatous colonic polyps   . Hypertension   . Hypothyroidism    2016- synthroid d/c'd   . Osteoarthritis    OA- knee- L knee, R thumb  . Osteopenia   . Thyroid disease   . Vitamin D deficiency     Past Surgical History:  Procedure Laterality Date  . BELPHAROPTOSIS REPAIR Bilateral 04/2015   vision correction  . BREAST BIOPSY     right   . CATARACT EXTRACTION, BILATERAL Bilateral 01/2014, 02/2014   /w IOL  . CESAREAN SECTION     x 2   . CHOLECYSTECTOMY    . EYE SURGERY    . GALLBLADDER SURGERY    . SHOULDER SURGERY  2006   right   . SHOULDER SURGERY Left 07/17/2013    bone spurs, and partial tear of rotator cuff  . TOTAL KNEE ARTHROPLASTY Left 08/14/2017   Procedure: LEFT TOTAL KNEE ARTHROPLASTY;  Surgeon: Vickey Huger, MD;   Location: [place];  Service: Orthopedics;  Laterality: Left;  . TUBAL LIGATION      Current Outpatient Medications  Medication Sig Dispense Refill  . acetaminophen (TYLENOL) 500 MG tablet Take 1,000 mg by mouth every 6 (six) hours as needed for mild pain or moderate pain.    Marland Kitchen ADVAIR DISKUS 250-50 MCG/DOSE AEPB Inhale 1 puff into the lungs daily.     . cholecalciferol (VITAMIN D) 1000 units tablet Take 1,000 Units by mouth daily.    . folic acid (FOLVITE) 1 MG tablet Take 1 mg by mouth daily.    Marland Kitchen gabapentin (NEURONTIN) 300 MG capsule Take 300 mg by mouth at bedtime.     Marland Kitchen KLOR-CON M20 20 MEQ tablet Take 40 mEq by mouth 3 (three) times daily.     Marland Kitchen losartan-hydrochlorothiazide (HYZAAR) 100-12.5 MG tablet Take 1 tablet by mouth daily.  5  . methotrexate (RHEUMATREX) 2.5 MG tablet Take 2.5-5 mg by mouth once a week. Sunday    .  montelukast (SINGULAIR) 10 MG tablet Take 10 mg by mouth at bedtime as needed. Once daily    . Propylene Glycol (SYSTANE BALANCE OP) Place 1 drop into both eyes daily.    . Vitamin D, Ergocalciferol, (DRISDOL) 50000 UNITS CAPS Take 50,000 Units by mouth every 7 (seven) days. Sunday     No current facility-administered medications for this visit.      ALLERGIES: Penicillins and Moxifloxacin  Family History  Problem Relation Age of Onset  . Heart disease Father   . Diabetes Paternal Grandfather   . Asthma Son   . Colon cancer Neg Hx     Social History   Socioeconomic History  . Marital status: Divorced    Spouse name: Not on file  . Number of children: 2  . Years of education: Not on file  . Highest education level: Not on file  Occupational History  . Occupation: Retired  Scientific laboratory technician  . Financial resource strain: Not on file  . Food insecurity:    Worry: Not on file    Inability: Not on file  . Transportation needs:    Medical: Not on file    Non-medical: Not on file  Tobacco Use  . Smoking status: Former Smoker    Packs/day: 0.25    Years:  50.00    Pack years: 12.50    Types: Cigarettes    Last attempt to quit: 08/11/2017    Years since quitting: 0.3  . Smokeless tobacco: Never Used  Substance and Sexual Activity  . Alcohol use: Yes    Alcohol/week: 3.0 - 3.6 oz    Types: 5 - 6 Cans of beer per week  . Drug use: No  . Sexual activity: Not Currently    Partners: Male  Lifestyle  . Physical activity:    Days per week: Not on file    Minutes per session: Not on file  . Stress: Not on file  Relationships  . Social connections:    Talks on phone: Not on file    Gets together: Not on file    Attends religious service: Not on file    Active member of club or organization: Not on file    Attends meetings of clubs or organizations: Not on file    Relationship status: Not on file  . Intimate partner violence:    Fear of current or ex partner: Not on file    Emotionally abused: Not on file    Physically abused: Not on file    Forced sexual activity: Not on file  Other Topics Concern  . Not on file  Social History Narrative   2 caffeine drinks daily     Review of Systems  Constitutional: Negative.   HENT: Negative.   Eyes: Negative.   Respiratory: Negative.   Cardiovascular: Negative.   Gastrointestinal: Negative.   Genitourinary: Positive for dysuria, frequency and urgency.  Musculoskeletal: Negative.   Skin: Negative.   Neurological: Negative.   Endo/Heme/Allergies: Negative.   Psychiatric/Behavioral: Negative.     PHYSICAL EXAMINATION:    BP 118/60 (BP Location: Right Arm, Patient Position: Sitting, Cuff Size: Normal)   Pulse 88   Resp 16   Wt 148 lb (67.1 kg)   LMP 12/09/1997 (Approximate)   BMI 29.89 kg/m     General appearance: alert, cooperative and appears stated age Abdomen: soft, non-tender; non distended, no masses,  no organomegaly CVA: non tender Lower back: mildly tender to palpation   Urine dip: +3 blood, +3 leuk, +  nitrates  ASSESSMENT Cystitis   PLAN Bactrim DS x 3 days (only  on methotrexate on Sunday, only using 3 days of Bactrim) She has AZO for pain, advised not to use longer than 48 hours   An After Visit Summary was printed and given to the patient.

## 2017-12-25 NOTE — Patient Instructions (Signed)

## 2017-12-25 NOTE — Telephone Encounter (Signed)
Patient believes she has a bladder infection and would like to be seen today if possible. She is also having lower back pain.

## 2017-12-27 LAB — URINE CULTURE

## 2017-12-28 ENCOUNTER — Telehealth: Payer: Self-pay | Admitting: *Deleted

## 2017-12-28 NOTE — Telephone Encounter (Signed)
Spoke with patient and gave results. She is feeling much better -eh

## 2017-12-28 NOTE — Telephone Encounter (Signed)
-----   Message from Emma Dom, MD sent at 12/27/2017  6:31 PM EDT ----- Please inform the patient that she did have a UTI and it is sensitive to the antibiotic she was given. Please see how she is feeling.

## 2017-12-28 NOTE — Telephone Encounter (Signed)
Left message to call regarding lab results -eh 

## 2018-05-30 ENCOUNTER — Inpatient Hospital Stay (HOSPITAL_COMMUNITY)
Admission: EM | Admit: 2018-05-30 | Discharge: 2018-06-02 | DRG: 342 | Disposition: A | Discharge status: Home / Self Care | Payer: Medicare Other | Attending: General Surgery | Admitting: General Surgery

## 2018-05-30 ENCOUNTER — Ambulatory Visit
Admission: RE | Admit: 2018-05-30 | Discharge: 2018-05-30 | Disposition: A | Discharge status: Home / Self Care | Payer: Medicare Other | Source: Ambulatory Visit | Attending: Internal Medicine | Admitting: Internal Medicine

## 2018-05-30 ENCOUNTER — Other Ambulatory Visit: Payer: Self-pay | Admitting: Internal Medicine

## 2018-05-30 ENCOUNTER — Encounter (HOSPITAL_COMMUNITY): Admission: EM | Disposition: A | Discharge status: Home / Self Care | Payer: Self-pay | Source: Home / Self Care

## 2018-05-30 ENCOUNTER — Encounter (HOSPITAL_COMMUNITY): Payer: Self-pay | Admitting: Emergency Medicine

## 2018-05-30 ENCOUNTER — Other Ambulatory Visit: Payer: Self-pay

## 2018-05-30 ENCOUNTER — Inpatient Hospital Stay (HOSPITAL_COMMUNITY): Payer: Medicare Other | Admitting: Anesthesiology

## 2018-05-30 DIAGNOSIS — I10 Essential (primary) hypertension: Secondary | ICD-10-CM | POA: Diagnosis present

## 2018-05-30 DIAGNOSIS — R1031 Right lower quadrant pain: Secondary | ICD-10-CM

## 2018-05-30 DIAGNOSIS — K3580 Unspecified acute appendicitis: Secondary | ICD-10-CM | POA: Diagnosis not present

## 2018-05-30 DIAGNOSIS — E039 Hypothyroidism, unspecified: Secondary | ICD-10-CM | POA: Diagnosis present

## 2018-05-30 DIAGNOSIS — J449 Chronic obstructive pulmonary disease, unspecified: Secondary | ICD-10-CM | POA: Diagnosis present

## 2018-05-30 DIAGNOSIS — J440 Chronic obstructive pulmonary disease with acute lower respiratory infection: Secondary | ICD-10-CM | POA: Diagnosis present

## 2018-05-30 DIAGNOSIS — K35891 Other acute appendicitis without perforation, with gangrene: Principal | ICD-10-CM | POA: Diagnosis present

## 2018-05-30 DIAGNOSIS — Z87891 Personal history of nicotine dependence: Secondary | ICD-10-CM

## 2018-05-30 DIAGNOSIS — Z7982 Long term (current) use of aspirin: Secondary | ICD-10-CM

## 2018-05-30 DIAGNOSIS — M19041 Primary osteoarthritis, right hand: Secondary | ICD-10-CM | POA: Diagnosis present

## 2018-05-30 DIAGNOSIS — M1712 Unilateral primary osteoarthritis, left knee: Secondary | ICD-10-CM | POA: Diagnosis present

## 2018-05-30 DIAGNOSIS — Z833 Family history of diabetes mellitus: Secondary | ICD-10-CM

## 2018-05-30 DIAGNOSIS — Z9841 Cataract extraction status, right eye: Secondary | ICD-10-CM

## 2018-05-30 DIAGNOSIS — Z961 Presence of intraocular lens: Secondary | ICD-10-CM | POA: Diagnosis present

## 2018-05-30 DIAGNOSIS — Z8249 Family history of ischemic heart disease and other diseases of the circulatory system: Secondary | ICD-10-CM

## 2018-05-30 DIAGNOSIS — Z825 Family history of asthma and other chronic lower respiratory diseases: Secondary | ICD-10-CM

## 2018-05-30 DIAGNOSIS — Z880 Allergy status to penicillin: Secondary | ICD-10-CM

## 2018-05-30 DIAGNOSIS — L409 Psoriasis, unspecified: Secondary | ICD-10-CM | POA: Diagnosis present

## 2018-05-30 DIAGNOSIS — E559 Vitamin D deficiency, unspecified: Secondary | ICD-10-CM | POA: Diagnosis present

## 2018-05-30 DIAGNOSIS — Z9842 Cataract extraction status, left eye: Secondary | ICD-10-CM

## 2018-05-30 DIAGNOSIS — J209 Acute bronchitis, unspecified: Secondary | ICD-10-CM | POA: Diagnosis present

## 2018-05-30 DIAGNOSIS — Z881 Allergy status to other antibiotic agents status: Secondary | ICD-10-CM

## 2018-05-30 DIAGNOSIS — K567 Ileus, unspecified: Secondary | ICD-10-CM | POA: Diagnosis not present

## 2018-05-30 DIAGNOSIS — Z9049 Acquired absence of other specified parts of digestive tract: Secondary | ICD-10-CM

## 2018-05-30 DIAGNOSIS — Z96652 Presence of left artificial knee joint: Secondary | ICD-10-CM | POA: Diagnosis present

## 2018-05-30 HISTORY — PX: LAPAROSCOPIC APPENDECTOMY: SHX408

## 2018-05-30 LAB — CBC WITH DIFFERENTIAL/PLATELET
Abs Immature Granulocytes: 0.08 10*3/uL — ABNORMAL HIGH (ref 0.00–0.07)
BASOS ABS: 0 10*3/uL (ref 0.0–0.1)
BASOS PCT: 0 %
EOS ABS: 0 10*3/uL (ref 0.0–0.5)
EOS PCT: 0 %
HEMATOCRIT: 36.1 % (ref 36.0–46.0)
Hemoglobin: 11.6 g/dL — ABNORMAL LOW (ref 12.0–15.0)
Immature Granulocytes: 0 %
Lymphocytes Relative: 8 %
Lymphs Abs: 1.4 10*3/uL (ref 0.7–4.0)
MCH: 29.5 pg (ref 26.0–34.0)
MCHC: 32.1 g/dL (ref 30.0–36.0)
MCV: 91.9 fL (ref 80.0–100.0)
Monocytes Absolute: 1.7 10*3/uL — ABNORMAL HIGH (ref 0.1–1.0)
Monocytes Relative: 9 %
NRBC: 0 % (ref 0.0–0.2)
Neutro Abs: 15.1 10*3/uL — ABNORMAL HIGH (ref 1.7–7.7)
Neutrophils Relative %: 83 %
Platelets: 304 10*3/uL (ref 150–400)
RBC: 3.93 MIL/uL (ref 3.87–5.11)
RDW: 14.1 % (ref 11.5–15.5)
WBC: 18.4 10*3/uL — AB (ref 4.0–10.5)

## 2018-05-30 LAB — COMPREHENSIVE METABOLIC PANEL
ALT: 36 U/L (ref 0–44)
AST: 28 U/L (ref 15–41)
Albumin: 3.7 g/dL (ref 3.5–5.0)
Alkaline Phosphatase: 119 U/L (ref 38–126)
Anion gap: 11 (ref 5–15)
BUN: 17 mg/dL (ref 8–23)
CALCIUM: 9.2 mg/dL (ref 8.9–10.3)
CO2: 25 mmol/L (ref 22–32)
CREATININE: 0.72 mg/dL (ref 0.44–1.00)
Chloride: 103 mmol/L (ref 98–111)
GFR calc non Af Amer: >60 mL/min (ref >60)
Glucose, Bld: 87 mg/dL (ref 70–99)
Potassium: 3.5 mmol/L (ref 3.5–5.1)
Sodium: 139 mmol/L (ref 135–145)
TOTAL PROTEIN: 7.2 g/dL (ref 6.5–8.1)
Total Bilirubin: 1.2 mg/dL (ref 0.3–1.2)

## 2018-05-30 LAB — TYPE AND SCREEN
ABO/RH(D): O POS
ANTIBODY SCREEN: NEGATIVE

## 2018-05-30 LAB — ABO/RH: ABO/RH(D): O POS

## 2018-05-30 SURGERY — APPENDECTOMY, LAPAROSCOPIC
Anesthesia: General | Site: Abdomen

## 2018-05-30 MED ORDER — HYDROCODONE-ACETAMINOPHEN 5-325 MG PO TABS
1.0000 | ORAL_TABLET | ORAL | Status: DC | PRN
  Administered 2018-05-30 – 2018-06-01 (×5): 1 via ORAL
  Administered 2018-06-02: 2 via ORAL
  Filled 2018-05-30: qty 2
  Filled 2018-05-30 (×2): qty 1
  Filled 2018-05-30: qty 2
  Filled 2018-05-30 (×2): qty 1

## 2018-05-30 MED ORDER — ACETAMINOPHEN 500 MG PO TABS
ORAL_TABLET | ORAL | Status: AC
  Filled 2018-05-30: qty 2

## 2018-05-30 MED ORDER — BUPIVACAINE-EPINEPHRINE (PF) 0.25% -1:200000 IJ SOLN
INTRAMUSCULAR | Status: AC
  Filled 2018-05-30: qty 30

## 2018-05-30 MED ORDER — GABAPENTIN 300 MG PO CAPS
300.0000 mg | ORAL_CAPSULE | ORAL | Status: AC
  Administered 2018-05-30: 300 mg via ORAL

## 2018-05-30 MED ORDER — SENNOSIDES-DOCUSATE SODIUM 8.6-50 MG PO TABS
1.0000 | ORAL_TABLET | Freq: Every day | ORAL | Status: DC
  Administered 2018-05-30 – 2018-05-31 (×2): 1 via ORAL
  Filled 2018-05-30 (×2): qty 1

## 2018-05-30 MED ORDER — MORPHINE SULFATE (PF) 2 MG/ML IV SOLN: 2.0000 mg | INTRAVENOUS | Status: DC | PRN

## 2018-05-30 MED ORDER — SODIUM CHLORIDE 0.9 % IV SOLN
INTRAVENOUS | Status: DC | PRN
  Administered 2018-05-30: 50 ug/min via INTRAVENOUS

## 2018-05-30 MED ORDER — 0.9 % SODIUM CHLORIDE (POUR BTL) OPTIME
TOPICAL | Status: DC | PRN
  Administered 2018-05-30: 1000 mL

## 2018-05-30 MED ORDER — ROCURONIUM BROMIDE 50 MG/5ML IV SOSY
PREFILLED_SYRINGE | INTRAVENOUS | Status: DC | PRN
  Administered 2018-05-30: 40 mg via INTRAVENOUS

## 2018-05-30 MED ORDER — DOCUSATE SODIUM 100 MG PO CAPS: 100.0000 mg | ORAL_CAPSULE | Freq: Two times a day (BID) | ORAL | Status: DC

## 2018-05-30 MED ORDER — CIPROFLOXACIN IN D5W 400 MG/200ML IV SOLN
400.0000 mg | Freq: Two times a day (BID) | INTRAVENOUS | Status: DC
  Administered 2018-05-31 (×2): 400 mg via INTRAVENOUS
  Filled 2018-05-30 (×2): qty 200

## 2018-05-30 MED ORDER — ENOXAPARIN SODIUM 40 MG/0.4ML ~~LOC~~ SOLN: 40.0000 mg | SUBCUTANEOUS | Status: DC

## 2018-05-30 MED ORDER — SCOPOLAMINE 1 MG/3DAYS TD PT72
1.0000 | MEDICATED_PATCH | TRANSDERMAL | Status: DC
  Administered 2018-05-30: 1.5 mg via TRANSDERMAL
  Filled 2018-05-30: qty 1

## 2018-05-30 MED ORDER — SUGAMMADEX SODIUM 200 MG/2ML IV SOLN
INTRAVENOUS | Status: DC | PRN
  Administered 2018-05-30: 150 mg via INTRAVENOUS

## 2018-05-30 MED ORDER — ACETAMINOPHEN 325 MG PO TABS: 650.0000 mg | ORAL_TABLET | Freq: Four times a day (QID) | ORAL | Status: DC | PRN

## 2018-05-30 MED ORDER — BUPIVACAINE-EPINEPHRINE 0.25% -1:200000 IJ SOLN
INTRAMUSCULAR | Status: DC | PRN
  Administered 2018-05-30: 20 mL

## 2018-05-30 MED ORDER — LACTATED RINGERS IV SOLN: INTRAVENOUS | Status: DC

## 2018-05-30 MED ORDER — PROPOFOL 10 MG/ML IV BOLUS
INTRAVENOUS | Status: DC | PRN
  Administered 2018-05-30: 130 mg via INTRAVENOUS

## 2018-05-30 MED ORDER — METRONIDAZOLE IN NACL 5-0.79 MG/ML-% IV SOLN
500.0000 mg | Freq: Three times a day (TID) | INTRAVENOUS | Status: DC
  Administered 2018-05-31 – 2018-06-01 (×4): 500 mg via INTRAVENOUS
  Filled 2018-05-30 (×4): qty 100

## 2018-05-30 MED ORDER — POLYVINYL ALCOHOL 1.4 % OP SOLN
1.0000 [drp] | Freq: Every day | OPHTHALMIC | Status: DC
  Administered 2018-05-31 – 2018-06-01 (×2): 1 [drp] via OPHTHALMIC
  Filled 2018-05-30: qty 15

## 2018-05-30 MED ORDER — FENTANYL CITRATE (PF) 100 MCG/2ML IJ SOLN
INTRAMUSCULAR | Status: AC
  Filled 2018-05-30: qty 2

## 2018-05-30 MED ORDER — PROPOFOL 10 MG/ML IV BOLUS
INTRAVENOUS | Status: AC
  Filled 2018-05-30: qty 20

## 2018-05-30 MED ORDER — LOSARTAN POTASSIUM 50 MG PO TABS
100.0000 mg | ORAL_TABLET | Freq: Every day | ORAL | Status: DC
  Administered 2018-05-31 – 2018-06-02 (×3): 100 mg via ORAL
  Filled 2018-05-30 (×3): qty 2

## 2018-05-30 MED ORDER — LACTATED RINGERS IV SOLN
INTRAVENOUS | Status: AC | PRN
  Administered 2018-05-30: 1000 mL

## 2018-05-30 MED ORDER — ACETAMINOPHEN 500 MG PO TABS
1000.0000 mg | ORAL_TABLET | ORAL | Status: AC
  Administered 2018-05-30: 1000 mg via ORAL

## 2018-05-30 MED ORDER — ONDANSETRON HCL 4 MG/2ML IJ SOLN: 4.0000 mg | Freq: Four times a day (QID) | INTRAMUSCULAR | Status: DC | PRN

## 2018-05-30 MED ORDER — ENOXAPARIN SODIUM 40 MG/0.4ML ~~LOC~~ SOLN
40.0000 mg | SUBCUTANEOUS | Status: DC
  Administered 2018-05-31 – 2018-06-02 (×3): 40 mg via SUBCUTANEOUS
  Filled 2018-05-30 (×3): qty 0.4

## 2018-05-30 MED ORDER — FENTANYL CITRATE (PF) 100 MCG/2ML IJ SOLN
INTRAMUSCULAR | Status: DC | PRN
  Administered 2018-05-30 (×2): 50 ug via INTRAVENOUS

## 2018-05-30 MED ORDER — MEPERIDINE HCL 50 MG/ML IJ SOLN: 6.2500 mg | INTRAMUSCULAR | Status: DC | PRN

## 2018-05-30 MED ORDER — ONDANSETRON 4 MG PO TBDP: 4.0000 mg | ORAL_TABLET | Freq: Four times a day (QID) | ORAL | Status: DC | PRN

## 2018-05-30 MED ORDER — SODIUM CHLORIDE 0.9 % IV BOLUS
500.0000 mL | Freq: Once | INTRAVENOUS | Status: AC
  Administered 2018-05-30: 500 mL via INTRAVENOUS

## 2018-05-30 MED ORDER — ACETAMINOPHEN 650 MG RE SUPP: 650.0000 mg | Freq: Four times a day (QID) | RECTAL | Status: DC | PRN

## 2018-05-30 MED ORDER — LIDOCAINE 2% (20 MG/ML) 5 ML SYRINGE
INTRAMUSCULAR | Status: DC | PRN
  Administered 2018-05-30: 100 mg via INTRAVENOUS

## 2018-05-30 MED ORDER — LACTATED RINGERS IV SOLN
INTRAVENOUS | Status: DC
  Administered 2018-05-30: 18:00:00 via INTRAVENOUS

## 2018-05-30 MED ORDER — HYDROCHLOROTHIAZIDE 12.5 MG PO CAPS
12.5000 mg | ORAL_CAPSULE | Freq: Every day | ORAL | Status: DC
  Administered 2018-05-31 – 2018-06-02 (×3): 12.5 mg via ORAL
  Filled 2018-05-30 (×3): qty 1

## 2018-05-30 MED ORDER — SIMETHICONE 80 MG PO CHEW
40.0000 mg | CHEWABLE_TABLET | Freq: Four times a day (QID) | ORAL | Status: DC | PRN
  Administered 2018-06-01: 40 mg via ORAL
  Filled 2018-05-30: qty 1

## 2018-05-30 MED ORDER — SODIUM CHLORIDE 0.9 % IV SOLN: INTRAVENOUS | Status: DC

## 2018-05-30 MED ORDER — HYDRALAZINE HCL 20 MG/ML IJ SOLN: 10.0000 mg | INTRAMUSCULAR | Status: DC | PRN

## 2018-05-30 MED ORDER — SUGAMMADEX SODIUM 200 MG/2ML IV SOLN
INTRAVENOUS | Status: AC
  Filled 2018-05-30: qty 2

## 2018-05-30 MED ORDER — GABAPENTIN 300 MG PO CAPS
ORAL_CAPSULE | ORAL | Status: AC
  Filled 2018-05-30: qty 1

## 2018-05-30 MED ORDER — DEXAMETHASONE SODIUM PHOSPHATE 10 MG/ML IJ SOLN
INTRAMUSCULAR | Status: DC | PRN
  Administered 2018-05-30: 10 mg via INTRAVENOUS

## 2018-05-30 MED ORDER — METRONIDAZOLE IN NACL 5-0.79 MG/ML-% IV SOLN
500.0000 mg | Freq: Three times a day (TID) | INTRAVENOUS | Status: DC
  Administered 2018-05-30: 500 mg via INTRAVENOUS

## 2018-05-30 MED ORDER — MOMETASONE FURO-FORMOTEROL FUM 200-5 MCG/ACT IN AERO
2.0000 | INHALATION_SPRAY | Freq: Two times a day (BID) | RESPIRATORY_TRACT | Status: DC
  Administered 2018-05-31 – 2018-06-02 (×4): 2 via RESPIRATORY_TRACT
  Filled 2018-05-30: qty 8.8

## 2018-05-30 MED ORDER — ONDANSETRON HCL 4 MG/2ML IJ SOLN
INTRAMUSCULAR | Status: DC | PRN
  Administered 2018-05-30: 4 mg via INTRAVENOUS

## 2018-05-30 MED ORDER — DEXAMETHASONE SODIUM PHOSPHATE 10 MG/ML IJ SOLN
INTRAMUSCULAR | Status: AC
  Filled 2018-05-30: qty 1

## 2018-05-30 MED ORDER — LIDOCAINE 2% (20 MG/ML) 5 ML SYRINGE
INTRAMUSCULAR | Status: AC
  Filled 2018-05-30: qty 5

## 2018-05-30 MED ORDER — ONDANSETRON HCL 4 MG/2ML IJ SOLN
INTRAMUSCULAR | Status: AC
  Filled 2018-05-30: qty 2

## 2018-05-30 MED ORDER — CIPROFLOXACIN IN D5W 400 MG/200ML IV SOLN
INTRAVENOUS | Status: AC
  Filled 2018-05-30: qty 200

## 2018-05-30 MED ORDER — BISACODYL 10 MG RE SUPP: 10.0000 mg | Freq: Every day | RECTAL | Status: DC | PRN

## 2018-05-30 MED ORDER — SUCCINYLCHOLINE CHLORIDE 200 MG/10ML IV SOSY
PREFILLED_SYRINGE | INTRAVENOUS | Status: DC | PRN
  Administered 2018-05-30: 120 mg via INTRAVENOUS

## 2018-05-30 MED ORDER — GABAPENTIN 300 MG PO CAPS
300.0000 mg | ORAL_CAPSULE | Freq: Every day | ORAL | Status: DC
  Administered 2018-05-30 – 2018-06-01 (×3): 300 mg via ORAL
  Filled 2018-05-30 (×3): qty 1

## 2018-05-30 MED ORDER — HYDROMORPHONE HCL 1 MG/ML IJ SOLN: 0.2500 mg | INTRAMUSCULAR | Status: DC | PRN

## 2018-05-30 MED ORDER — PROPYLENE GLYCOL 0.6 % OP SOLN: Freq: Every day | OPHTHALMIC | Status: DC

## 2018-05-30 MED ORDER — DIPHENHYDRAMINE HCL 12.5 MG/5ML PO ELIX: 12.5000 mg | ORAL_SOLUTION | Freq: Four times a day (QID) | ORAL | Status: DC | PRN

## 2018-05-30 MED ORDER — DIPHENHYDRAMINE HCL 50 MG/ML IJ SOLN: 12.5000 mg | Freq: Four times a day (QID) | INTRAMUSCULAR | Status: DC | PRN

## 2018-05-30 MED ORDER — MONTELUKAST SODIUM 10 MG PO TABS
10.0000 mg | ORAL_TABLET | Freq: Every day | ORAL | Status: DC
  Filled 2018-05-30 (×3): qty 1

## 2018-05-30 MED ORDER — IOPAMIDOL (ISOVUE-300) INJECTION 61%
100.0000 mL | Freq: Once | INTRAVENOUS | Status: AC | PRN
  Administered 2018-05-30: 100 mL via INTRAVENOUS

## 2018-05-30 MED ORDER — CIPROFLOXACIN IN D5W 400 MG/200ML IV SOLN
400.0000 mg | Freq: Two times a day (BID) | INTRAVENOUS | Status: DC
  Administered 2018-05-30: 400 mg via INTRAVENOUS

## 2018-05-30 MED ORDER — LOSARTAN POTASSIUM-HCTZ 100-12.5 MG PO TABS: 1.0000 | ORAL_TABLET | Freq: Every day | ORAL | Status: DC

## 2018-05-30 MED ORDER — METRONIDAZOLE IN NACL 5-0.79 MG/ML-% IV SOLN
INTRAVENOUS | Status: AC
  Filled 2018-05-30: qty 100

## 2018-05-30 MED ORDER — SCOPOLAMINE 1 MG/3DAYS TD PT72
MEDICATED_PATCH | TRANSDERMAL | Status: AC
  Filled 2018-05-30: qty 1

## 2018-05-30 MED ORDER — KCL IN DEXTROSE-NACL 20-5-0.45 MEQ/L-%-% IV SOLN
INTRAVENOUS | Status: DC
  Administered 2018-05-30 – 2018-05-31 (×2): via INTRAVENOUS
  Filled 2018-05-30 (×2): qty 1000

## 2018-05-30 MED ORDER — FAMOTIDINE IN NACL 20-0.9 MG/50ML-% IV SOLN: 20.0000 mg | INTRAVENOUS | Status: DC

## 2018-05-30 MED ORDER — PHENYLEPHRINE 40 MCG/ML (10ML) SYRINGE FOR IV PUSH (FOR BLOOD PRESSURE SUPPORT)
PREFILLED_SYRINGE | INTRAVENOUS | Status: DC | PRN
  Administered 2018-05-30 (×4): 80 ug via INTRAVENOUS

## 2018-05-30 SURGICAL SUPPLY — 48 items
ADH SKN CLS APL DERMABOND .7 (GAUZE/BANDAGES/DRESSINGS) ×1
APPLIER CLIP 5 13 M/L LIGAMAX5 (MISCELLANEOUS)
APPLIER CLIP ROT 10 11.4 M/L (STAPLE) ×3
APR CLP MED LRG 11.4X10 (STAPLE) ×1
APR CLP MED LRG 5 ANG JAW (MISCELLANEOUS)
BAG SPEC RTRVL LRG 6X4 10 (ENDOMECHANICALS) ×1
CABLE HIGH FREQUENCY MONO STRZ (ELECTRODE) ×3 IMPLANT
CHLORAPREP W/TINT 26ML (MISCELLANEOUS) ×3 IMPLANT
CLIP APPLIE 5 13 M/L LIGAMAX5 (MISCELLANEOUS) IMPLANT
CLIP APPLIE ROT 10 11.4 M/L (STAPLE) IMPLANT
COVER WAND RF STERILE (DRAPES) ×2 IMPLANT
DECANTER SPIKE VIAL GLASS SM (MISCELLANEOUS) ×3 IMPLANT
DERMABOND ADVANCED (GAUZE/BANDAGES/DRESSINGS) ×2
DERMABOND ADVANCED .7 DNX12 (GAUZE/BANDAGES/DRESSINGS) ×1 IMPLANT
DRAIN CHANNEL 19F RND (DRAIN) ×2 IMPLANT
DRAPE LAPAROSCOPIC ABDOMINAL (DRAPES) IMPLANT
ELECT REM PT RETURN 15FT ADLT (MISCELLANEOUS) ×3 IMPLANT
EVACUATOR SILICONE 100CC (DRAIN) ×2 IMPLANT
GLOVE BIO SURGEON STRL SZ 6.5 (GLOVE) ×2 IMPLANT
GLOVE BIO SURGEONS STRL SZ 6.5 (GLOVE) ×1
GLOVE BIOGEL PI IND STRL 7.0 (GLOVE) ×1 IMPLANT
GLOVE BIOGEL PI INDICATOR 7.0 (GLOVE) ×2
GOWN STRL REUS W/TWL 2XL LVL3 (GOWN DISPOSABLE) ×3 IMPLANT
GOWN STRL REUS W/TWL XL LVL3 (GOWN DISPOSABLE) ×3 IMPLANT
GRASPER SUT TROCAR 14GX15 (MISCELLANEOUS) IMPLANT
HANDLE STAPLE EGIA 4 XL (STAPLE) ×2 IMPLANT
IRRIG SUCT STRYKERFLOW 2 WTIP (MISCELLANEOUS) ×3
IRRIGATION SUCT STRKRFLW 2 WTP (MISCELLANEOUS) ×1 IMPLANT
KIT BASIN OR (CUSTOM PROCEDURE TRAY) ×3 IMPLANT
MARKER SKIN DUAL TIP RULER LAB (MISCELLANEOUS) IMPLANT
POUCH SPECIMEN RETRIEVAL 10MM (ENDOMECHANICALS) ×2 IMPLANT
RELOAD EGIA 45 MED/THCK PURPLE (STAPLE) IMPLANT
RELOAD EGIA 45 TAN VASC (STAPLE) IMPLANT
RELOAD EGIA 60 MED/THCK PURPLE (STAPLE) ×3 IMPLANT
RELOAD EGIA 60 TAN VASC (STAPLE) IMPLANT
RELOAD STAPLE 60 MED/THCK ART (STAPLE) IMPLANT
SCISSORS LAP 5X35 DISP (ENDOMECHANICALS) IMPLANT
SLEEVE XCEL OPT CAN 5 100 (ENDOMECHANICALS) ×2 IMPLANT
SUT ETHILON 3 0 PS 1 (SUTURE) ×2 IMPLANT
SUT VIC AB 2-0 SH 27 (SUTURE)
SUT VIC AB 2-0 SH 27X BRD (SUTURE) IMPLANT
SUT VIC AB 4-0 PS2 27 (SUTURE) ×3 IMPLANT
SUT VICRYL 0 UR6 27IN ABS (SUTURE) IMPLANT
TOWEL OR 17X26 10 PK STRL BLUE (TOWEL DISPOSABLE) ×3 IMPLANT
TRAY FOLEY MTR SLVR 16FR STAT (SET/KITS/TRAYS/PACK) ×3 IMPLANT
TRAY LAPAROSCOPIC (CUSTOM PROCEDURE TRAY) ×3 IMPLANT
TROCAR BLADELESS OPT 5 100 (ENDOMECHANICALS) IMPLANT
TROCAR XCEL BLUNT TIP 100MML (ENDOMECHANICALS) ×3 IMPLANT

## 2018-05-30 NOTE — ED Notes (Signed)
Bed: PP29 Expected date:  Expected time:  Means of arrival:  Comments: [place]

## 2018-05-30 NOTE — Anesthesia Preprocedure Evaluation (Addendum)
Anesthesia Evaluation  Patient identified by MRN, date of birth, ID band Patient awake    Reviewed: Allergy & Precautions, NPO status , Patient's Chart, lab work & pertinent test results  Airway Mallampati: II  TM Distance: <3 FB Neck ROM: Full  Mouth opening: Limited Mouth Opening  Dental  (+) Teeth Intact, Dental Advisory Given   Pulmonary asthma , COPD,  COPD inhaler, former smoker,    breath sounds clear to auscultation       Cardiovascular hypertension, Pt. on medications  Rhythm:Regular Rate:Normal     Neuro/Psych negative neurological ROS     GI/Hepatic negative GI ROS, Neg liver ROS,   Endo/Other  Hypothyroidism   Renal/GU negative Renal ROS     Musculoskeletal   Abdominal (+) - obese,  Abdomen: tender.    Peds  Hematology   Anesthesia Other Findings   Reproductive/Obstetrics                            Lab Results  Component Value Date   WBC 18.4 (H) 05/30/2018   HGB 11.6 (L) 05/30/2018   HCT 36.1 05/30/2018   MCV 91.9 05/30/2018   PLT 304 05/30/2018   Lab Results  Component Value Date   CREATININE 0.72 05/30/2018   BUN 17 05/30/2018   NA 139 05/30/2018   K 3.5 05/30/2018   CL 103 05/30/2018   CO2 25 05/30/2018   No results found for: INR, PROTIME  EKG: normal sinus rhythm.  Anesthesia Physical Anesthesia Plan  ASA: III and emergent  Anesthesia Plan: General   Post-op Pain Management:    Induction: Intravenous, Rapid sequence and Cricoid pressure planned  PONV Risk Score and Plan: 4 or greater and Ondansetron, Dexamethasone and Treatment may vary due to age or medical condition  Airway Management Planned: Oral ETT  Additional Equipment: None  Intra-op Plan:   Post-operative Plan: Extubation in OR  Informed Consent: I have reviewed the patients History and Physical, chart, labs and discussed the procedure including the risks, benefits and alternatives  for the proposed anesthesia with the patient or authorized representative who has indicated his/her understanding and acceptance.     Plan Discussed with: CRNA  Anesthesia Plan Comments:        Anesthesia Quick Evaluation

## 2018-05-30 NOTE — H&P (Signed)
[facility] Surgery Consult/Admission Note  Emma [name] Aug 07, 1940  [id number].    Requesting MD: Dr. Francia Greaves:  Chief Complaint/Reason for Consult: appendicitis  HPI:   Pt is a 77 yo female with a history of C section, lap chole, allergies, HTN who presented to the ED under the advice of her PCP after a CT scan today showed appendicitis. Pt began having mid abdominal pain early Monday morning. Mild pain, non radiating, that progressed to constant, severe with touch with associated temp of 99-100F. Pt had diarrhea over the weekend otherwise stools have been normal. No nausea, vomiting, chills, CP, SOB or other associated symptoms. No anticoagulation. Last meal was 0800 this am.   ROS:  Review of Systems  Constitutional: Positive for fever. Negative for chills and diaphoresis.  HENT: Negative for sore throat.   Respiratory: Negative for cough and shortness of breath.   Cardiovascular: Negative for chest pain.  Gastrointestinal: Positive for abdominal pain. Negative for blood in stool, constipation, diarrhea, nausea and vomiting.  Genitourinary: Negative for dysuria.  Skin: Negative for rash.  Neurological: Negative for dizziness and loss of consciousness.  All other systems reviewed and are negative.    Family History  Problem Relation Age of Onset  . Heart disease Father   . Diabetes Paternal Grandfather   . Asthma Son   . Colon cancer Neg Hx     Past Medical History:  Diagnosis Date  . Allergy   . Asthma    as child  . 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 pt. unsure of pathology  . Cataract   . Condyloma   . Constipation   . Hx of adenomatous colonic polyps   . Hypertension   . Hypothyroidism    2016- synthroid d/c'd   . Osteoarthritis    OA- knee- L knee, R thumb  . Osteopenia   . Thyroid disease   . Vitamin D deficiency     Past Surgical History:  Procedure Laterality Date  .  BELPHAROPTOSIS REPAIR Bilateral 04/2015   vision correction  . BREAST BIOPSY     right   . CATARACT EXTRACTION, BILATERAL Bilateral 01/2014, 02/2014   /w IOL  . CESAREAN SECTION     x 2   . CHOLECYSTECTOMY    . EYE SURGERY    . GALLBLADDER SURGERY    . SHOULDER SURGERY  2006   right   . SHOULDER SURGERY Left 07/17/2013    bone spurs, and partial tear of rotator cuff  . TOTAL KNEE ARTHROPLASTY Left 08/14/2017   Procedure: LEFT TOTAL KNEE ARTHROPLASTY;  Surgeon: Vickey Huger, MD;  Location: [facility];  Service: Orthopedics;  Laterality: Left;  . TUBAL LIGATION      Social History:  reports that she quit smoking about 9 months ago. Her smoking use included cigarettes. She has a 12.50 pack-year smoking history. She has never used smokeless tobacco. She reports that she drinks about 5.0 - 6.0 standard drinks of alcohol per week. She reports that she does not use drugs.  Allergies:  Allergies  Allergen Reactions  . Penicillins Hives, Rash and Other (See Comments)    PATIENT HAS HAD A PCN REACTION WITH IMMEDIATE RASH, FACIAL/TONGUE/THROAT SWELLING, SOB, OR LIGHTHEADEDNESS WITH HYPOTENSION:  #  #  #  YES  #  #  #   Has patient had a PCN reaction causing severe rash involving mucus membranes or skin necrosis: no  Has patient had a PCN reaction that required hospitalization: no Has patient had a PCN reaction occurring within the last 10 years: #  #  #  YES  #  #  #    . Moxifloxacin     UNSPECIFIED REACTION      (Not in a hospital admission)  Blood pressure (!) 127/58, pulse 87, temperature 98 F (36.7 C), temperature source Oral, resp. rate 14, height 5\' 1"  (1.549 m), weight 69.4 kg, last menstrual period 12/09/1997, SpO2 100 %.  Physical Exam  Constitutional: She is oriented to person, place, and time. She appears well-developed and well-nourished.  Non-toxic appearance. She does not appear ill. No distress.  HENT:  Head: Normocephalic and atraumatic.  Nose: Nose normal.  Mouth/Throat:  Oropharynx is clear and moist and mucous membranes are normal. No oropharyngeal exudate.  Eyes: Pupils are equal, round, and reactive to light. Conjunctivae are normal. Right eye exhibits no discharge. Left eye exhibits no discharge. No scleral icterus.  Neck: Normal range of motion. Neck supple. No thyromegaly present.  Cardiovascular: Normal rate, regular rhythm, normal heart sounds and intact distal pulses.  No murmur heard. Pulses:      Radial pulses are 2+ on the right side, and 2+ on the left side.  Pulmonary/Chest: Effort normal and breath sounds normal. No respiratory distress. She has no wheezes. She has no rhonchi. She has no rales.  Abdominal: Soft. Normal appearance and bowel sounds are normal. She exhibits no distension. There is no hepatosplenomegaly. There is tenderness in the right lower quadrant. There is guarding and tenderness at McBurney's point. There is no rigidity.  Musculoskeletal: Normal range of motion. She exhibits edema (mild edema of b/l feet and ankles). She exhibits no tenderness or deformity.  Lymphadenopathy:    She has no cervical adenopathy.  Neurological: She is alert and oriented to person, place, and time.  Skin: Skin is warm and dry. No rash noted. She is not diaphoretic.  Psychiatric: She has a normal mood and affect.  Nursing note and vitals reviewed.   No results found for this or any previous visit (from the past 48 hour(s)). Ct Abdomen Pelvis W Contrast  Result Date: 05/30/2018 CLINICAL DATA:  Acute right lower quadrant abdominal pain. EXAM: CT ABDOMEN AND PELVIS WITH CONTRAST TECHNIQUE: Multidetector CT imaging of the abdomen and pelvis was performed using the standard protocol following bolus administration of intravenous contrast. CONTRAST:  154mL ISOVUE-300 IOPAMIDOL (ISOVUE-300) INJECTION 61% COMPARISON:  None. FINDINGS: Lower chest: Moderate size sliding-type hiatal hernia is noted. Visualized lung bases are unremarkable. Hepatobiliary: No focal  liver abnormality is seen. Status post cholecystectomy. No biliary dilatation. Pancreas: Unremarkable. No pancreatic ductal dilatation or surrounding inflammatory changes. Spleen: Normal in size without focal abnormality. Adrenals/Urinary Tract: Adrenal glands are unremarkable. Kidneys are normal, without renal calculi, focal lesion, or hydronephrosis. Bladder is unremarkable. Stomach/Bowel: There is no evidence of bowel obstruction. Sigmoid diverticulosis is noted without inflammation. The appendix is enlarged with surrounding inflammation consistent with acute appendicitis. Appendix: Location: Right lower quadrant. Diameter: 16 mm. Appendicolith: None. Mucosal hyper-enhancement: Yes. Extraluminal gas: No. Periappendiceal collection: No. Vascular/Lymphatic: Aortic atherosclerosis. No enlarged abdominal or pelvic lymph nodes. Reproductive: Uterus and bilateral adnexa are unremarkable. Other: No abdominal wall hernia or abnormality. No abdominopelvic ascites. Musculoskeletal: No acute or significant osseous findings. IMPRESSION: Findings consistent with acute appendicitis. No definite abscess or perforation is noted. These results will be called to the ordering clinician or representative by the Radiologist Assistant, and communication documented in  the PACS or zVision Dashboard. Sigmoid diverticulosis without inflammation. Moderate size sliding-type hiatal hernia. Aortic Atherosclerosis (ICD10-I70.0). Electronically Signed   By: Marijo Conception, M.D.   On: 05/30/2018 13:26      Assessment/Plan Active Problems:   * No active hospital problems. *  HTN - home Hyzaar on hold Psoriasis - home methotrexate on hold (she only takes 1-2 of these a week)  Appendicitis - CT scan showed acute appendicitis, no abscess or perforation noted - labs pending  FEN: NPO, IVF VTE: SCD's, lovenox on hold until POD 1 ID: Cipro & Flagyl 11/20>> Foley: none Follow up: TBD  Plan: admit to [facility], OR today for lap  appy   Kalman Drape, [facility] Surgery 05/30/2018, 4:00 PM Pager: [phone] Consults: [phone] Mon-Fri 7:00 am-4:30 pm Sat-Sun 7:00 am-11:30 am

## 2018-05-30 NOTE — Anesthesia Postprocedure Evaluation (Signed)
Anesthesia Post Note  Patient: Emma [name]  Procedure(s) Performed: APPENDECTOMY LAPAROSCOPIC (N/A Abdomen)     Patient location during evaluation: PACU Anesthesia Type: General Level of consciousness: awake and alert Pain management: pain level controlled Vital Signs Assessment: post-procedure vital signs reviewed and stable Respiratory status: spontaneous breathing, nonlabored ventilation, respiratory function stable and patient connected to nasal cannula oxygen Cardiovascular status: blood pressure returned to baseline and stable Postop Assessment: no apparent nausea or vomiting Anesthetic complications: no    Last Vitals:  Vitals:   05/30/18 2225 05/30/18 2316  BP: (!) 123/39 (!) 115/52  Pulse: (!) 58 67  Resp: 20 18  Temp: 36.5 C 36.5 C  SpO2: 100% 100%                 Effie Berkshire

## 2018-05-30 NOTE — ED Triage Notes (Signed)
Patient from [facility], sent for acute appendicitis. Reports abdominal pain x3 days.

## 2018-05-30 NOTE — ED Provider Notes (Signed)
[facility] DEPT Provider Note   CSN: [id number] Arrival date & time: 05/30/18  1420     History   Chief Complaint Chief Complaint  Patient presents with  . Abdominal Pain    HPI Emma [name] is a 77 y.o. female.  77 year old female with prior medical history as detailed below presents for evaluation of likely appendicitis.  Patient reports 3-day history of abdominal pain.  Patient was sent for outpatient CT imaging today.  [facility] performed CT and reportedly told the patient that she had acute appendicitis.    Patient reports associated intermittent fevers for the last 3 days.  She denies nausea or vomiting.  She denies diarrhea. She declines pain medications at this time.   She last ate around 8 AM. She reports taking two baby ASA earlier this morning for fever.  Prior abdominal surgeries include 2 c-sections and cholecystectomy.   The history is provided by the patient and medical records.  Abdominal Pain   This is a new problem. The current episode started more than 2 days ago. The problem occurs constantly. The problem has not changed since onset.The pain is located in the RLQ. The pain is mild. Associated symptoms include fever. Nothing aggravates the symptoms. Nothing relieves the symptoms.    Past Medical History:  Diagnosis Date  . Allergy   . Asthma    as child  . 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 pt. unsure of pathology  . Cataract   . Condyloma   . Constipation   . Hx of adenomatous colonic polyps   . Hypertension   . Hypothyroidism    2016- synthroid d/c'd   . Osteoarthritis    OA- knee- L knee, R thumb  . Osteopenia   . Thyroid disease   . Vitamin D deficiency     Patient Active Problem List   Diagnosis Date Noted  . 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 Acute bronchitis 04/02/2013  . COPD (chronic obstructive pulmonary disease)  ([place]) 04/02/2013    Past Surgical History:  Procedure Laterality Date  . BELPHAROPTOSIS REPAIR Bilateral 04/2015   vision correction  . BREAST BIOPSY     right   . CATARACT EXTRACTION, BILATERAL Bilateral 01/2014, 02/2014   /w IOL  . CESAREAN SECTION     x 2   . CHOLECYSTECTOMY    . EYE SURGERY    . GALLBLADDER SURGERY    . SHOULDER SURGERY  2006   right   . SHOULDER SURGERY Left 07/17/2013    bone spurs, and partial tear of rotator cuff  . TOTAL KNEE ARTHROPLASTY Left 08/14/2017   Procedure: LEFT TOTAL KNEE ARTHROPLASTY;  Surgeon: Vickey Huger, MD;  Location: [facility];  Service: Orthopedics;  Laterality: Left;  . TUBAL LIGATION       OB History    Gravida  2   Para  2   Term  2   Preterm      AB      Living  2     SAB      TAB      Ectopic      Multiple      Live Births               Home Medications    Prior to Admission medications   Medication Sig Start Date End Date Taking? Authorizing Provider  acetaminophen (TYLENOL)  500 MG tablet Take 1,000 mg by mouth every 6 (six) hours as needed for mild pain or moderate pain.    [provider]  ADVAIR DISKUS 250-50 MCG/DOSE AEPB Inhale 1 puff into the lungs daily.  11/13/10   [provider]  cholecalciferol (VITAMIN D) 1000 units tablet Take 1,000 Units by mouth daily.    [provider]  folic acid (FOLVITE) 1 MG tablet Take 1 mg by mouth daily.    [provider]  gabapentin (NEURONTIN) 300 MG capsule Take 300 mg by mouth at bedtime.  10/27/10   [provider]  KLOR-CON M20 20 MEQ tablet Take 40 mEq by mouth 3 (three) times daily.  12/01/10   [provider]  losartan-hydrochlorothiazide (HYZAAR) 100-12.5 MG tablet Take 1 tablet by mouth daily. 12/05/17   [provider]  methotrexate (RHEUMATREX) 2.5 MG tablet Take 2.5-5 mg by mouth once a week. Sunday 12/01/10   [provider]  montelukast (SINGULAIR) 10 MG tablet Take 10 mg by mouth at  bedtime as needed. Once daily 03/30/13   [provider]  Propylene Glycol (SYSTANE BALANCE OP) Place 1 drop into both eyes daily.    [provider]  sulfamethoxazole-trimethoprim (BACTRIM DS) 800-160 MG tablet Take 1 tablet by mouth 2 (two) times daily. One PO BID x 3 days 12/25/17   Salvadore Dom, MD  Vitamin D, Ergocalciferol, (DRISDOL) 50000 UNITS CAPS Take 50,000 Units by mouth every 7 (seven) days. Sunday 10/08/10   [provider]    Family History Family History  Problem Relation Age of Onset  . Heart disease Father   . Diabetes Paternal Grandfather   . Asthma Son   . Colon cancer Neg Hx     Social History Social History   Tobacco Use  . Smoking status: Former Smoker    Packs/day: 0.25    Years: 50.00    Pack years: 12.50    Types: Cigarettes    Last attempt to quit: 08/11/2017    Years since quitting: 0.8  . Smokeless tobacco: Never Used  Substance Use Topics  . Alcohol use: Yes    Alcohol/week: 5.0 - 6.0 standard drinks    Types: 5 - 6 Cans of beer per week  . Drug use: No     Allergies   Penicillins and Moxifloxacin   Review of Systems Review of Systems  Constitutional: Positive for fever.  Gastrointestinal: Positive for abdominal pain.  All other systems reviewed and are negative.    Physical Exam Updated Vital Signs BP 120/68 (BP Location: Left Arm)   Pulse (!) 109   Temp 98 F (36.7 C) (Oral)   Resp 16   Ht 5\' 1"  (1.549 m)   Wt 69.4 kg   LMP 12/09/1997 (Approximate)   SpO2 100%   BMI 28.91 kg/m   Physical Exam  Constitutional: She is oriented to person, place, and time. She appears well-developed and well-nourished. No distress.  HENT:  Head: Normocephalic and atraumatic.  Mouth/Throat: Oropharynx is clear and moist.  Eyes: Pupils are equal, round, and reactive to light. Conjunctivae and EOM are normal.  Neck: Normal range of motion. Neck supple.  Cardiovascular: Normal rate, regular rhythm and normal  heart sounds.  Pulmonary/Chest: Effort normal and breath sounds normal. No respiratory distress.  Abdominal: Soft. She exhibits no distension. There is tenderness in the right lower quadrant. There is no rigidity and no guarding.  Musculoskeletal: Normal range of motion. She exhibits no edema or deformity.  Neurological: She is alert and oriented to person, place, and time.  Skin: Skin is warm and dry.  Psychiatric: She has a normal mood and affect.  Nursing note and vitals reviewed.    ED Treatments / Results  Labs (all labs ordered are listed, but only abnormal results are displayed) Labs Reviewed  COMPREHENSIVE METABOLIC PANEL  CBC WITH DIFFERENTIAL/PLATELET  TYPE AND SCREEN    EKG None  Radiology Ct Abdomen Pelvis W Contrast  Result Date: 05/30/2018 CLINICAL DATA:  Acute right lower quadrant abdominal pain. EXAM: CT ABDOMEN AND PELVIS WITH CONTRAST TECHNIQUE: Multidetector CT imaging of the abdomen and pelvis was performed using the standard protocol following bolus administration of intravenous contrast. CONTRAST:  165mL ISOVUE-300 IOPAMIDOL (ISOVUE-300) INJECTION 61% COMPARISON:  None. FINDINGS: Lower chest: Moderate size sliding-type hiatal hernia is noted. Visualized lung bases are unremarkable. Hepatobiliary: No focal liver abnormality is seen. Status post cholecystectomy. No biliary dilatation. Pancreas: Unremarkable. No pancreatic ductal dilatation or surrounding inflammatory changes. Spleen: Normal in size without focal abnormality. Adrenals/Urinary Tract: Adrenal glands are unremarkable. Kidneys are normal, without renal calculi, focal lesion, or hydronephrosis. Bladder is unremarkable. Stomach/Bowel: There is no evidence of bowel obstruction. Sigmoid diverticulosis is noted without inflammation. The appendix is enlarged with surrounding inflammation consistent with acute appendicitis. Appendix: Location: Right lower quadrant. Diameter: 16 mm. Appendicolith: None. Mucosal  hyper-enhancement: Yes. Extraluminal gas: No. Periappendiceal collection: No. Vascular/Lymphatic: Aortic atherosclerosis. No enlarged abdominal or pelvic lymph nodes. Reproductive: Uterus and bilateral adnexa are unremarkable. Other: No abdominal wall hernia or abnormality. No abdominopelvic ascites. Musculoskeletal: No acute or significant osseous findings. IMPRESSION: Findings consistent with acute appendicitis. No definite abscess or perforation is noted. These results will be called to the ordering clinician or representative by the Radiologist Assistant, and communication documented in the PACS or zVision Dashboard. Sigmoid diverticulosis without inflammation. Moderate size sliding-type hiatal hernia. Aortic Atherosclerosis (ICD10-I70.0). Electronically Signed   By: Marijo Conception, M.D.   On: 05/30/2018 13:26    Procedures Procedures (including critical care time)  Medications Ordered in ED Medications  sodium chloride 0.9 % bolus 500 mL (has no administration in time range)     Initial Impression / Assessment and Plan / ED Course  I have reviewed the triage vital signs and the nursing notes.  Pertinent labs & imaging results that were available during my care of the patient were reviewed by me and considered in my medical decision making (see chart for details).     MDM  Screen complete  Patient is presenting for evaluation of acute appendicitis.   Patient appears stable at time of my evaluation in the ED.   Surgery is aware of the case and will admit the patient.   Final Clinical Impressions(s) / ED Diagnoses   Final diagnoses:  Acute appendicitis, unspecified acute appendicitis type    ED Discharge Orders    None       Valarie Merino, MD 05/30/18 [phone]

## 2018-05-30 NOTE — ED Notes (Signed)
ED TO INPATIENT HANDOFF REPORT  Name/Age/Gender Emma [name] 77 y.o. female  Code Status    Code Status Orders  (From admission, onward)         Start     Ordered   05/30/18 1614  Full code  Continuous     05/30/18 1613        Code Status History    Date Active Date Inactive Code Status Order ID Comments User Context   08/14/2017 1704 08/16/2017 1744 Full Code [id number]  Donia Ast, PA Inpatient    Advance Directive Documentation     Most Recent Value  Type of Advance Directive  Healthcare Power of Attorney, Living will  Pre-existing out of facility DNR order (yellow form or pink MOST form)  -  "MOST" Form in Place?  -      Home/SNF/Other Home  Chief Complaint side pain  Level of Care/Admitting Diagnosis ED Disposition    ED Disposition Condition [place]: [facility] [100102]  Level of Care: Med-Surg [16]  Diagnosis: Appendicitis [412878]  Admitting Physician: CCS, [place]  Attending Physician: CCS, MD [[id number]]  Estimated length of stay: past midnight tomorrow  Certification:: I certify this patient will need inpatient services for at least 2 midnights  Bed request comments: 5W  PT Class (Do Not Modify): Inpatient [101]  PT Acc Code (Do Not Modify): Private [1]       Medical History Past Medical History:  Diagnosis Date  . Allergy   . Asthma    as child  . 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 pt. unsure of pathology  . Cataract   . Condyloma   . Constipation   . Hx of adenomatous colonic polyps   . Hypertension   . Hypothyroidism    2016- synthroid d/c'd   . Osteoarthritis    OA- knee- L knee, R thumb  . Osteopenia   . Thyroid disease   . Vitamin D deficiency     Allergies Allergies  Allergen Reactions  . Penicillins Hives, Rash and Other (See Comments)    PATIENT HAS HAD A PCN REACTION WITH IMMEDIATE RASH,  FACIAL/TONGUE/THROAT SWELLING, SOB, OR LIGHTHEADEDNESS WITH HYPOTENSION:  #  #  #  YES  #  #  #   Has patient had a PCN reaction causing severe rash involving mucus membranes or skin necrosis: no Has patient had a PCN reaction that required hospitalization: no Has patient had a PCN reaction occurring within the last 10 years: #  #  #  YES  #  #  #    . Moxifloxacin     UNSPECIFIED REACTION     IV Location/Drains/Wounds Patient Lines/Drains/Airways Status   Active Line/Drains/Airways    Name:   Placement date:   Placement time:   Site:   Days:   Peripheral IV 05/30/18 Right Antecubital   05/30/18    1543    Antecubital   less than 1   Incision (Closed) 08/14/17 Leg Left   08/14/17    1122     289          Labs/Imaging Results for orders placed or performed during the hospital encounter of 05/30/18 (from the past 48 hour(s))  Type and screen [place]     Status: None   Collection Time: 05/30/18  3:46 PM  Result Value Ref  Range   ABO/RH(D) O POS    Antibody Screen NEG    Sample Expiration      06/02/2018 Performed at [facility], [place] [address] [place], [place] [address]   Comprehensive metabolic panel     Status: None   Collection Time: 05/30/18  4:05 PM  Result Value Ref Range   Sodium 139 135 - 145 mmol/L   Potassium 3.5 3.5 - 5.1 mmol/L   Chloride 103 98 - 111 mmol/L   CO2 25 22 - 32 mmol/L   Glucose, Bld 87 70 - 99 mg/dL   BUN 17 8 - 23 mg/dL   Creatinine, Ser 0.72 0.44 - 1.00 mg/dL   Calcium 9.2 8.9 - 10.3 mg/dL   Total Protein 7.2 6.5 - 8.1 g/dL   Albumin 3.7 3.5 - 5.0 g/dL   AST 28 15 - 41 U/L   ALT 36 0 - 44 U/L   Alkaline Phosphatase 119 38 - 126 U/L   Total Bilirubin 1.2 0.3 - 1.2 mg/dL   GFR calc non Af Amer >60 >60 mL/min   GFR calc Af Amer >60 >60 mL/min    Comment: (NOTE) The eGFR has been calculated using the CKD EPI equation. This calculation has not been validated in all clinical situations. eGFR's  persistently <60 mL/min signify possible Chronic Kidney Disease.    Anion gap 11 5 - 15    Comment: Performed at [facility], [place] [address], [place], [place] [address]  CBC with Differential     Status: Abnormal   Collection Time: 05/30/18  4:05 PM  Result Value Ref Range   WBC 18.4 (H) 4.0 - 10.5 K/uL   RBC 3.93 3.87 - 5.11 MIL/uL   Hemoglobin 11.6 (L) 12.0 - 15.0 g/dL   HCT 36.1 36.0 - 46.0 %   MCV 91.9 80.0 - 100.0 fL   MCH 29.5 26.0 - 34.0 pg   MCHC 32.1 30.0 - 36.0 g/dL   RDW 14.1 11.5 - 15.5 %   Platelets 304 150 - 400 K/uL   nRBC 0.0 0.0 - 0.2 %   Neutrophils Relative % 83 %   Neutro Abs 15.1 (H) 1.7 - 7.7 K/uL   Lymphocytes Relative 8 %   Lymphs Abs 1.4 0.7 - 4.0 K/uL   Monocytes Relative 9 %   Monocytes Absolute 1.7 (H) 0.1 - 1.0 K/uL   Eosinophils Relative 0 %   Eosinophils Absolute 0.0 0.0 - 0.5 K/uL   Basophils Relative 0 %   Basophils Absolute 0.0 0.0 - 0.1 K/uL   Immature Granulocytes 0 %   Abs Immature Granulocytes 0.08 (H) 0.00 - 0.07 K/uL    Comment: Performed at [facility], [place] [address] [place], [place] [address]   Ct Abdomen Pelvis W Contrast  Result Date: 05/30/2018 CLINICAL DATA:  Acute right lower quadrant abdominal pain. EXAM: CT ABDOMEN AND PELVIS WITH CONTRAST TECHNIQUE: Multidetector CT imaging of the abdomen and pelvis was performed using the standard protocol following bolus administration of intravenous contrast. CONTRAST:  177m ISOVUE-300 IOPAMIDOL (ISOVUE-300) INJECTION 61% COMPARISON:  None. FINDINGS: Lower chest: Moderate size sliding-type hiatal hernia is noted. Visualized lung bases are unremarkable. Hepatobiliary: No focal liver abnormality is seen. Status post cholecystectomy. No biliary dilatation. Pancreas: Unremarkable. No pancreatic ductal dilatation or surrounding inflammatory changes. Spleen: Normal in size without focal abnormality. Adrenals/Urinary Tract: Adrenal glands are unremarkable.  Kidneys are normal, without renal calculi, focal lesion, or hydronephrosis. Bladder is unremarkable. Stomach/Bowel: There  is no evidence of bowel obstruction. Sigmoid diverticulosis is noted without inflammation. The appendix is enlarged with surrounding inflammation consistent with acute appendicitis. Appendix: Location: Right lower quadrant. Diameter: 16 mm. Appendicolith: None. Mucosal hyper-enhancement: Yes. Extraluminal gas: No. Periappendiceal collection: No. Vascular/Lymphatic: Aortic atherosclerosis. No enlarged abdominal or pelvic lymph nodes. Reproductive: Uterus and bilateral adnexa are unremarkable. Other: No abdominal wall hernia or abnormality. No abdominopelvic ascites. Musculoskeletal: No acute or significant osseous findings. IMPRESSION: Findings consistent with acute appendicitis. No definite abscess or perforation is noted. These results will be called to the ordering clinician or representative by the Radiologist Assistant, and communication documented in the PACS or zVision Dashboard. Sigmoid diverticulosis without inflammation. Moderate size sliding-type hiatal hernia. Aortic Atherosclerosis (ICD10-I70.0). Electronically Signed   By: Marijo Conception, M.D.   On: 05/30/2018 13:26    Pending Labs Unresulted Labs (From admission, onward)    Start     Ordered   06/06/18 0500  Creatinine, serum  (enoxaparin (LOVENOX)    CrCl >/= 30 ml/min)  Weekly,   R    Comments:  while on enoxaparin therapy    05/30/18 1613   05/31/18 4098  Basic metabolic panel  Tomorrow morning,   R     05/30/18 1613   05/31/18 0500  CBC  Tomorrow morning,   R     05/30/18 1613   05/30/18 1614  CBC  (enoxaparin (LOVENOX)    CrCl >/= 30 ml/min)  Once,   R    Comments:  Baseline for enoxaparin therapy IF NOT ALREADY DRAWN.  Notify MD if PLT < 100 K.    05/30/18 1613   05/30/18 1614  Creatinine, serum  (enoxaparin (LOVENOX)    CrCl >/= 30 ml/min)  Once,   R    Comments:  Baseline for enoxaparin therapy IF NOT  ALREADY DRAWN.    05/30/18 1613   05/30/18 1543  ABO/Rh  Once,   R     05/30/18 1543          Vitals/Pain Today's Vitals   05/30/18 1456 05/30/18 1500 05/30/18 1549  BP: 120/68  (!) 127/58  Pulse: (!) 109  87  Resp: 16  14  Temp: 98 F (36.7 C)    TempSrc: Oral    SpO2: 100%  100%  Weight: 69.4 kg    Height: _0  (1.549 m)    PainSc:  4      Isolation Precautions No active isolations  Medications Medications  enoxaparin (LOVENOX) injection 40 mg (has no administration in time range)  0.9 %  sodium chloride infusion (has no administration in time range)  ciprofloxacin (CIPRO) IVPB 400 mg (has no administration in time range)    And  metroNIDAZOLE (FLAGYL) IVPB 500 mg (has no administration in time range)  acetaminophen (TYLENOL) tablet 650 mg (has no administration in time range)    Or  acetaminophen (TYLENOL) suppository 650 mg (has no administration in time range)  morphine 2 MG/ML injection 2-4 mg (has no administration in time range)  diphenhydrAMINE (BENADRYL) 12.5 MG/5ML elixir 12.5 mg (has no administration in time range)    Or  diphenhydrAMINE (BENADRYL) injection 12.5 mg (has no administration in time range)  docusate sodium (COLACE) capsule 100 mg (has no administration in time range)  ondansetron (ZOFRAN-ODT) disintegrating tablet 4 mg (has no administration in time range)    Or  ondansetron (ZOFRAN) injection 4 mg (has no administration in time range)  hydrALAZINE (APRESOLINE) injection 10 mg (has no administration in  time range)  famotidine (PEPCID) IVPB 20 mg premix (has no administration in time range)  gabapentin (NEURONTIN) capsule 300 mg (has no administration in time range)  acetaminophen (TYLENOL) tablet 1,000 mg (has no administration in time range)  scopolamine (TRANSDERM-SCOP) 1 MG/3DAYS 1.5 mg (has no administration in time range)  sodium chloride 0.9 % bolus 500 mL (500 mLs Intravenous New Bag/Given (Non-Interop) 05/30/18 1550)     Mobility walks with device

## 2018-05-30 NOTE — Anesthesia Procedure Notes (Signed)
Procedure Name: Intubation Date/Time: 05/30/2018 7:37 PM Performed by: West Pugh, CRNA Pre-anesthesia Checklist: Patient identified, Emergency Drugs available, Suction available, Patient being monitored and Timeout performed Patient Re-evaluated:Patient Re-evaluated prior to induction Oxygen Delivery Method: Circle system utilized Preoxygenation: Pre-oxygenation with 100% oxygen Induction Type: IV induction, Rapid sequence and Cricoid Pressure applied Laryngoscope Size: Mac and 3 Grade View: Grade III Tube type: Oral Tube size: 7.0 mm Number of attempts: 2 Airway Equipment and Method: Bougie stylet Placement Confirmation: ETT inserted through vocal cords under direct vision,  positive ETCO2,  CO2 detector and breath sounds checked- equal and bilateral Secured at: 21 cm Tube secured with: Tape Dental Injury: Teeth and Oropharynx as per pre-operative assessment  Difficulty Due To: Difficult Airway- due to limited oral opening Comments: DL x2. 1st attempt unsuccessful. Utilized bougie at second attempt and successful. Grade 3 view both views

## 2018-05-30 NOTE — Transfer of Care (Signed)
Immediate Anesthesia Transfer of Care Note  Patient: Emma [name]  Procedure(s) Performed: APPENDECTOMY LAPAROSCOPIC (N/A Abdomen)  Patient Location: PACU  Anesthesia Type:General  Level of Consciousness: awake, alert  and oriented  Airway & Oxygen Therapy: Patient Spontanous Breathing and Patient connected to face mask oxygen  Post-op Assessment: Report given to RN and Post -op Vital signs reviewed and stable  Post vital signs: Reviewed and stable  Last Vitals:  Vitals Value Taken Time  BP    Temp    Pulse    Resp    SpO2      Last Pain:  Vitals:   05/30/18 1809  TempSrc:   PainSc: 4          Complications: No apparent anesthesia complications

## 2018-05-30 NOTE — Op Note (Signed)
Emma [name] [id number]   PRE-OPERATIVE DIAGNOSIS:  acute appendicitis  POST-OPERATIVE DIAGNOSIS:  Gangrenous appendicitis   Procedure(s): APPENDECTOMY LAPAROSCOPIC   Surgeon(s): Leighton Ruff, MD  ASSISTANT: none   ANESTHESIA:   local and general  EBL:   67ml  Delay start of Pharmacological VTE agent (>24hrs) due to surgical blood loss or risk of bleeding:  no  DRAINS: (34F) Jackson-Pratt drain(s) with closed bulb suction in the RLQ   SPECIMEN:  Source of Specimen:  appendix  DISPOSITION OF SPECIMEN:  PATHOLOGY  COUNTS:  YES  PLAN OF CARE: Admit to inpatient   PATIENT DISPOSITION:  PACU - hemodynamically stable.   INDICATIONS: Patient with concerning symptoms & work up suspicious for appendicitis.  Surgery was recommended:  The anatomy & physiology of the digestive tract was discussed.  The pathophysiology of appendicitis was discussed.  Natural history risks without surgery was discussed.   I feel the risks of no intervention will lead to serious problems that outweigh the operative risks; therefore, I recommended diagnostic laparoscopy with removal of appendix to remove the pathology.  Laparoscopic & open techniques were discussed.   I noted a good likelihood this will help address the problem.    Risks such as bleeding, infection, abscess, leak, reoperation, possible ostomy, hernia, heart attack, death, and other risks were discussed.  Goals of post-operative recovery were discussed as well.  We will work to minimize complications.  Questions were answered.  The patient expresses understanding & wishes to proceed with surgery.  OR FINDINGS: 77 y.o. F with several days of abd pain.  CT shows appendicitis.    DESCRIPTION:   The patient was identified & brought into the operating room. The patient was positioned supine with left arm tucked. SCDs were active during the entire case. The patient underwent general anesthesia without any difficulty.  A foley catheter was  inserted under sterile conditions. The abdomen was prepped and draped in a sterile fashion. A Surgical Timeout confirmed our plan.   I made a transverse incision through the inferior umbilical fold.  I made a nick in the infraumbilical fascia and confirmed peritoneal entry.  I placed a stay suture and then the [facility] port.  We induced carbon dioxide insufflation.  Camera inspection revealed no injury.  I placed additional ports under direct laparoscopic visualization.  I mobilized the terminal ileum to proximal ascending colon in a lateral to medial fashion.  I took care to avoid injuring any retroperitoneal structures.   I attempted to free the appendix off its attachments to the ascending colon and cecal mesentery, but the walls disintegrated with purulent output leaking out.  I bluntly mobilized the remaining structure.  I elevated what appeared to be the appendix base, which seemed still viable.  I stapled the appendix off the cecum using a laparoscopic purple load stapler.  I placed the appendix inside an EndoCatch bag and removed out the [place] port.  I did copious irrigation. Hemostasis was good in the mesoappendix, colon mesentery, and retroperitoneum. Staple line was intact on the cecum with no bleeding. I washed out the pelvis, retrohepatic space and right paracolic gutter.  Hemostasis is good. There was no perforation or injury.  I left a 34F blake drain in the RLQ.   I aspirated the carbon dioxide. I removed the ports. I closed the umbilical fascia site using a 0 Vicryl stitch. I closed skin using 4-0 vicryl stitch.  Sterile dressings were applied.  Patient was extubated and sent to the recovery room.  I discussed the operative findings with the patient's family. I suspect the patient is going used in the hospital at least overnight and will need antibiotics and the drain for 7 days. Questions answered. They expressed understanding and appreciation.

## 2018-05-31 ENCOUNTER — Encounter (HOSPITAL_COMMUNITY): Payer: Self-pay | Admitting: General Surgery

## 2018-05-31 DIAGNOSIS — Z7982 Long term (current) use of aspirin: Secondary | ICD-10-CM | POA: Diagnosis not present

## 2018-05-31 DIAGNOSIS — Z825 Family history of asthma and other chronic lower respiratory diseases: Secondary | ICD-10-CM | POA: Diagnosis not present

## 2018-05-31 DIAGNOSIS — K567 Ileus, unspecified: Secondary | ICD-10-CM | POA: Diagnosis not present

## 2018-05-31 DIAGNOSIS — Z8249 Family history of ischemic heart disease and other diseases of the circulatory system: Secondary | ICD-10-CM | POA: Diagnosis not present

## 2018-05-31 DIAGNOSIS — Z833 Family history of diabetes mellitus: Secondary | ICD-10-CM | POA: Diagnosis not present

## 2018-05-31 DIAGNOSIS — K3580 Unspecified acute appendicitis: Secondary | ICD-10-CM | POA: Diagnosis present

## 2018-05-31 DIAGNOSIS — E559 Vitamin D deficiency, unspecified: Secondary | ICD-10-CM | POA: Diagnosis present

## 2018-05-31 DIAGNOSIS — K35891 Other acute appendicitis without perforation, with gangrene: Secondary | ICD-10-CM | POA: Diagnosis present

## 2018-05-31 DIAGNOSIS — J440 Chronic obstructive pulmonary disease with acute lower respiratory infection: Secondary | ICD-10-CM | POA: Diagnosis present

## 2018-05-31 DIAGNOSIS — Z9841 Cataract extraction status, right eye: Secondary | ICD-10-CM | POA: Diagnosis not present

## 2018-05-31 DIAGNOSIS — L409 Psoriasis, unspecified: Secondary | ICD-10-CM | POA: Diagnosis present

## 2018-05-31 DIAGNOSIS — Z9049 Acquired absence of other specified parts of digestive tract: Secondary | ICD-10-CM | POA: Diagnosis not present

## 2018-05-31 DIAGNOSIS — I10 Essential (primary) hypertension: Secondary | ICD-10-CM | POA: Diagnosis present

## 2018-05-31 DIAGNOSIS — E039 Hypothyroidism, unspecified: Secondary | ICD-10-CM | POA: Diagnosis present

## 2018-05-31 DIAGNOSIS — Z96652 Presence of left artificial knee joint: Secondary | ICD-10-CM | POA: Diagnosis present

## 2018-05-31 DIAGNOSIS — Z880 Allergy status to penicillin: Secondary | ICD-10-CM | POA: Diagnosis not present

## 2018-05-31 DIAGNOSIS — M19041 Primary osteoarthritis, right hand: Secondary | ICD-10-CM | POA: Diagnosis present

## 2018-05-31 DIAGNOSIS — J209 Acute bronchitis, unspecified: Secondary | ICD-10-CM | POA: Diagnosis present

## 2018-05-31 DIAGNOSIS — M1712 Unilateral primary osteoarthritis, left knee: Secondary | ICD-10-CM | POA: Diagnosis present

## 2018-05-31 DIAGNOSIS — Z87891 Personal history of nicotine dependence: Secondary | ICD-10-CM | POA: Diagnosis not present

## 2018-05-31 DIAGNOSIS — Z961 Presence of intraocular lens: Secondary | ICD-10-CM | POA: Diagnosis present

## 2018-05-31 DIAGNOSIS — Z9842 Cataract extraction status, left eye: Secondary | ICD-10-CM | POA: Diagnosis not present

## 2018-05-31 DIAGNOSIS — Z881 Allergy status to other antibiotic agents status: Secondary | ICD-10-CM | POA: Diagnosis not present

## 2018-05-31 NOTE — Progress Notes (Signed)
I have reviewed and concur with this student's documentation.   

## 2018-05-31 NOTE — Addendum Note (Signed)
Addendum  created 05/31/18 0600 by Lollie Sails, CRNA   Charge Capture section accepted

## 2018-05-31 NOTE — Progress Notes (Signed)
[facility]/Trauma Progress Note  1 Day Post-Op   Assessment/Plan  HTN - home Hyzaar on hold Psoriasis - home methotrexate on hold (she only takes 1-2 of these a week)  Appendicitis - CT scan showed acute appendicitis, no abscess or perforation noted - S/P lap appy, Dr. Marcello Moores, 11/20 for gangrenous appendicitis - drain in place, leave drain in at discharge  FEN: CLD and advance as tolerated VTE: SCD's, lovenox  ID: Cipro & Flagyl 11/20>> Foley: none Follow up: Dr. Marcello Moores  Plan: plan for dc tomorrow with drain and abx for 1 week     LOS: 1 day    Subjective: CC: abdominal pain  Pt states RLQ pain. She has been up walking today. She is hungry. No issues overnight. Discussed plan with her for likely discharge tomorrow. No family at bedside.   Objective: Vital signs in last 24 hours: Temp:  [97.4 F (36.3 C)-98.1 F (36.7 C)] 98 F (36.7 C) (11/21 0800) Pulse Rate:  [58-109] 67 (11/21 0800) Resp:  [14-20] 18 (11/21 0800) BP: (115-138)/(39-89) 126/62 (11/21 0800) SpO2:  [97 %-100 %] 97 % (11/21 0800) Weight:  [69.4 kg] 69.4 kg (11/20 1809) Last BM Date: 05/30/18  Intake/Output from previous day: 11/20 0701 - 11/21 0700 In: 1920.3 [P.O.:480; I.V.:1140.3; IV Piggyback:300] Out: 960 [Urine:700; Drains:210; Blood:50] Intake/Output this shift: No intake/output data recorded.  PE: Gen:  Alert, NAD, pleasant, cooperative Card:  RRR, no M/G/R heard Pulm:  CTA, no W/R/R, effort normal Abd: Soft, ND, +BS, incisions with glue intact, drain with minimal serosanguinous drainage, TTP of RLQ with mild guarding. No peritonitis  Skin: no rashes noted, warm and dry   Anti-infectives: Anti-infectives (From admission, onward)   Start     Dose/Rate Route Frequency Ordered Stop   05/31/18 0800  ciprofloxacin (CIPRO) IVPB 400 mg     400 mg 200 mL/hr over 60 Minutes Intravenous Every 12 hours 05/30/18 2304     05/31/18 0600  metroNIDAZOLE (FLAGYL) IVPB 500 mg      500 mg 100 mL/hr over 60 Minutes Intravenous Every 8 hours 05/30/18 2304     05/30/18 1819  ciprofloxacin (CIPRO) 400 MG/200ML IVPB    Note to Pharmacy:  Emma [name]   : cabinet override      05/30/18 1819 05/30/18 1938   05/30/18 1819  metroNIDAZOLE (FLAGYL) 5-0.79 MG/ML-% IVPB    Note to Pharmacy:  Emma [name]   : cabinet override      05/30/18 1819 05/30/18 1949   05/30/18 1615  ciprofloxacin (CIPRO) IVPB 400 mg  Status:  Discontinued     400 mg 200 mL/hr over 60 Minutes Intravenous Every 12 hours 05/30/18 1613 05/30/18 2142   05/30/18 1615  metroNIDAZOLE (FLAGYL) IVPB 500 mg  Status:  Discontinued     500 mg 100 mL/hr over 60 Minutes Intravenous Every 8 hours 05/30/18 1613 05/30/18 2142      Lab Results:  Recent Labs    05/30/18 1605  WBC 18.4*  HGB 11.6*  HCT 36.1  PLT 304   BMET Recent Labs    05/30/18 1605  NA 139  K 3.5  CL 103  CO2 25  GLUCOSE 87  BUN 17  CREATININE 0.72  CALCIUM 9.2   PT/INR No results for input(s): LABPROT, INR in the last 72 hours. CMP     Component Value Date/Time   NA 139 05/30/2018 1605   K 3.5 05/30/2018 1605   CL 103 05/30/2018 1605   CO2 25 05/30/2018  1605   GLUCOSE 87 05/30/2018 1605   BUN 17 05/30/2018 1605   CREATININE 0.72 05/30/2018 1605   CALCIUM 9.2 05/30/2018 1605   PROT 7.2 05/30/2018 1605   ALBUMIN 3.7 05/30/2018 1605   AST 28 05/30/2018 1605   ALT 36 05/30/2018 1605   ALKPHOS 119 05/30/2018 1605   BILITOT 1.2 05/30/2018 1605   GFRNONAA >60 05/30/2018 1605   GFRAA >60 05/30/2018 1605   Lipase  No results found for: LIPASE  Studies/Results: Ct Abdomen Pelvis W Contrast  Result Date: 05/30/2018 CLINICAL DATA:  Acute right lower quadrant abdominal pain. EXAM: CT ABDOMEN AND PELVIS WITH CONTRAST TECHNIQUE: Multidetector CT imaging of the abdomen and pelvis was performed using the standard protocol following bolus administration of intravenous contrast. CONTRAST:  198mL ISOVUE-300 IOPAMIDOL (ISOVUE-300)  INJECTION 61% COMPARISON:  None. FINDINGS: Lower chest: Moderate size sliding-type hiatal hernia is noted. Visualized lung bases are unremarkable. Hepatobiliary: No focal liver abnormality is seen. Status post cholecystectomy. No biliary dilatation. Pancreas: Unremarkable. No pancreatic ductal dilatation or surrounding inflammatory changes. Spleen: Normal in size without focal abnormality. Adrenals/Urinary Tract: Adrenal glands are unremarkable. Kidneys are normal, without renal calculi, focal lesion, or hydronephrosis. Bladder is unremarkable. Stomach/Bowel: There is no evidence of bowel obstruction. Sigmoid diverticulosis is noted without inflammation. The appendix is enlarged with surrounding inflammation consistent with acute appendicitis. Appendix: Location: Right lower quadrant. Diameter: 16 mm. Appendicolith: None. Mucosal hyper-enhancement: Yes. Extraluminal gas: No. Periappendiceal collection: No. Vascular/Lymphatic: Aortic atherosclerosis. No enlarged abdominal or pelvic lymph nodes. Reproductive: Uterus and bilateral adnexa are unremarkable. Other: No abdominal wall hernia or abnormality. No abdominopelvic ascites. Musculoskeletal: No acute or significant osseous findings. IMPRESSION: Findings consistent with acute appendicitis. No definite abscess or perforation is noted. These results will be called to the ordering clinician or representative by the Radiologist Assistant, and communication documented in the PACS or zVision Dashboard. Sigmoid diverticulosis without inflammation. Moderate size sliding-type hiatal hernia. Aortic Atherosclerosis (ICD10-I70.0). Electronically Signed   By: Marijo Conception, M.D.   On: 05/30/2018 13:26      Kalman Drape , [facility] 05/31/2018, 8:47 AM  Pager: [phone] Mon-Wed, Friday 7:00am-4:30pm Thurs 7am-11:30am  Consults: [phone]

## 2018-06-01 MED ORDER — CIPROFLOXACIN HCL 500 MG PO TABS
500.0000 mg | ORAL_TABLET | Freq: Two times a day (BID) | ORAL | Status: DC
  Administered 2018-06-01 – 2018-06-02 (×3): 500 mg via ORAL
  Filled 2018-06-01 (×2): qty 1

## 2018-06-01 MED ORDER — METRONIDAZOLE 500 MG PO TABS
500.0000 mg | ORAL_TABLET | Freq: Three times a day (TID) | ORAL | Status: DC
  Administered 2018-06-01 – 2018-06-02 (×4): 500 mg via ORAL
  Filled 2018-06-01 (×4): qty 1

## 2018-06-01 MED ORDER — DOCUSATE SODIUM 100 MG PO CAPS
100.0000 mg | ORAL_CAPSULE | Freq: Every day | ORAL | Status: DC
  Administered 2018-06-01 – 2018-06-02 (×2): 100 mg via ORAL
  Filled 2018-06-01 (×2): qty 1

## 2018-06-01 MED ORDER — POLYETHYLENE GLYCOL 3350 17 G PO PACK
17.0000 g | PACK | Freq: Every day | ORAL | Status: DC
  Administered 2018-06-01 – 2018-06-02 (×2): 17 g via ORAL
  Filled 2018-06-01 (×2): qty 1

## 2018-06-01 NOTE — Progress Notes (Addendum)
Went by this afternoon to see if pt was okay for discharge and she informed me that she has been nauseated since breakfast. She was unable to eat lunch. She states she has had a little flatus, no BM and no vomiting. This is concerning for ileus. She states she was freezing cold a few hours ago. Since she is unable to tolerated her diet and we will re-evaluate her in the am to see if she is suitable for discharge.    Jackson Latino, [facility] Pager [phone]

## 2018-06-01 NOTE — Evaluation (Signed)
Physical Therapy Evaluation-1x eval Patient Details Name: Emma [name] MRN: [id number] DOB: 05-Aug-1940 Today's Date: 06/01/2018   History of Present Illness  77 yo female admitted with appendicitis. S/P appendectomy 11/20. Hx of L TKA 2019  Clinical Impression  On eval, pt was Supervision level for mobility. She walked ~200 feet without a device. She ascended/descended 3 steps with use of 1 handrail. Pt is mildly unsteady at times-no overt LOB. Dyspnea 2/4; O2 sat 100% on RA. She did c/o gas/indigestion pain during session-made RN aware. Pt reported she has been ambulating hallways and to/from bathroom unassisted. Do not anticipate any f/u PT needs. 1x eval.    Follow Up Recommendations Supervision - Intermittent (would recommend a few days of supervision from family if possible)    Equipment Recommendations  None recommended by PT    Recommendations for Other Services       Precautions / Restrictions Precautions Precautions: Fall Restrictions Weight Bearing Restrictions: No      Mobility  Bed Mobility Overal bed mobility: Modified Independent                Transfers Overall transfer level: Modified independent                  Ambulation/Gait Ambulation/Gait assistance: Supervision Gait Distance (Feet): 200 Feet Assistive device: None Gait Pattern/deviations: Step-through pattern;Drifts right/left     General Gait Details: mildly unsteady but no overt LOB. Pt tolerated distance fine. She has been ambulating hallway unassisted prior to PT eval.  Stairs Stairs: Yes Stairs assistance: Supervision Stair Management: One rail Right;Step to pattern;Forwards Number of Stairs: 3 General stair comments: for safety.   Wheelchair Mobility    Modified Rankin (Stroke Patients Only)       Balance Overall balance assessment: Mild deficits observed, not formally tested                                           Pertinent Vitals/Pain  Pain Assessment: Faces Faces Pain Scale: Hurts even more Pain Location: pt c/o gas/indigestion pains Pain Intervention(s): Patient requesting pain meds-RN notified;Monitored during session;Repositioned    Home Living Family/patient expects to be discharged to:: Private residence Living Arrangements: Alone   Type of Home: House Home Access: Stairs to enter Entrance Stairs-Rails: Right Entrance Stairs-Number of Steps: 3 Home Layout: One level Home Equipment: Bedside commode      Prior Function Level of Independence: Independent               Hand Dominance        Extremity/Trunk Assessment   Upper Extremity Assessment Upper Extremity Assessment: Overall WFL for tasks assessed    Lower Extremity Assessment Lower Extremity Assessment: Generalized weakness    Cervical / Trunk Assessment Cervical / Trunk Assessment: Normal  Communication   Communication: No difficulties  Cognition Arousal/Alertness: Awake/alert Behavior During Therapy: WFL for tasks assessed/performed Overall Cognitive Status: Within Functional Limits for tasks assessed                                        General Comments      Exercises     Assessment/Plan    PT Assessment Patent does not need any further PT services  PT Problem List         PT  Treatment Interventions      PT Goals (Current goals can be found in the Care Plan section)  Acute Rehab PT Goals Patient Stated Goal: home soon PT Goal Formulation: All assessment and education complete, DC therapy    Frequency     Barriers to discharge        Co-evaluation               AM-PAC PT "6 Clicks" Daily Activity  Outcome Measure Difficulty turning over in bed (including adjusting bedclothes, sheets and blankets)?: None Difficulty moving from lying on back to sitting on the side of the bed? : None Difficulty sitting down on and standing up from a chair with arms (e.g., wheelchair, bedside commode,  etc,.)?: None Help needed moving to and from a bed to chair (including a wheelchair)?: None Help needed walking in hospital room?: A Little Help needed climbing 3-5 steps with a railing? : A Little 6 Click Score: 22    End of Session   Activity Tolerance: Patient tolerated treatment well Patient left: in chair;with call bell/phone within reach   PT Visit Diagnosis: Unsteadiness on feet (R26.81)    Time: 9518-8416 PT Time Calculation (min) (ACUTE ONLY): 21 min   Charges:   PT Evaluation $PT Eval Moderate Complexity: [place], PT Acute Rehabilitation Services Pager: [phone] Office: [phone]

## 2018-06-01 NOTE — Progress Notes (Signed)
[facility]/Trauma Progress Note  2 Days Post-Op   Assessment/Plan HTN- home Hyzaar on hold Psoriasis- home methotrexate on hold (she only takes 1-2 of these a week)  Appendicitis - CT scan showed acute appendicitis, no abscess or perforation noted - S/P lap appy, Dr. Marcello Moores, 11/20 for gangrenous appendicitis - drain in place, leave drain in at discharge  FEN:reg diet VTE: SCD's, lovenox CW:CBJSE & Flagyl 11/20>> Foley:none Follow up:Dr. Marcello Moores  Plan:plan for dc today with drain and abx for 1 week, PT pending first    LOS: 2 days    Subjective: CC: abdominal soreness  Pt states wheezing overnight that has improved. She feels like she is getting a "cold" that she usually gets steroids for. She states she doesn't want to ask her children to stay with her this weekend but I told her I strongly recommend it. No other issues. She is having flatus, urinating, no nausea or vomiting and tolerating her diet.   Objective: Vital signs in last 24 hours: Temp:  [97.5 F (36.4 C)-98.3 F (36.8 C)] 97.5 F (36.4 C) (11/22 0603) Pulse Rate:  [66-78] 70 (11/22 0603) Resp:  [18] 18 (11/22 0603) BP: (108-130)/(51-86) 120/79 (11/22 0603) SpO2:  [94 %-100 %] 100 % (11/22 0603) Last BM Date: 05/30/18  Intake/Output from previous day: 11/21 0701 - 11/22 0700 In: 2808.3 [P.O.:1640; I.V.:868.3; IV Piggyback:300] Out: 2400 [Urine:2250; Drains:150] Intake/Output this shift: No intake/output data recorded.  PE: Gen:  Alert, NAD, pleasant, cooperative Card:  RRR, no M/G/R heard Pulm:  CTA, no W/R/R, rate and effort normal Abd: Soft, ND, +BS, incisions with glue intact, drain with minimal serosanguinous drainage, TTP of RLQ with mild guarding. No peritonitis  Skin: no rashes noted, warm and dry    Anti-infectives: Anti-infectives (From admission, onward)   Start     Dose/Rate Route Frequency Ordered Stop   06/01/18 0845  ciprofloxacin (CIPRO) tablet 500 mg      500 mg Oral 2 times daily 06/01/18 0843     06/01/18 0845  metroNIDAZOLE (FLAGYL) tablet 500 mg     500 mg Oral Every 8 hours 06/01/18 0843     05/31/18 0800  ciprofloxacin (CIPRO) IVPB 400 mg  Status:  Discontinued     400 mg 200 mL/hr over 60 Minutes Intravenous Every 12 hours 05/30/18 2304 06/01/18 0843   05/31/18 0600  metroNIDAZOLE (FLAGYL) IVPB 500 mg  Status:  Discontinued     500 mg 100 mL/hr over 60 Minutes Intravenous Every 8 hours 05/30/18 2304 06/01/18 0843   05/30/18 1819  ciprofloxacin (CIPRO) 400 MG/200ML IVPB    Note to Pharmacy:  Georgena Spurling   : cabinet override      05/30/18 1819 05/30/18 1938   05/30/18 1819  metroNIDAZOLE (FLAGYL) 5-0.79 MG/ML-% IVPB    Note to Pharmacy:  Georgena Spurling   : cabinet override      05/30/18 1819 05/30/18 1949   05/30/18 1615  ciprofloxacin (CIPRO) IVPB 400 mg  Status:  Discontinued     400 mg 200 mL/hr over 60 Minutes Intravenous Every 12 hours 05/30/18 1613 05/30/18 2142   05/30/18 1615  metroNIDAZOLE (FLAGYL) IVPB 500 mg  Status:  Discontinued     500 mg 100 mL/hr over 60 Minutes Intravenous Every 8 hours 05/30/18 1613 05/30/18 2142      Lab Results:  Recent Labs    05/30/18 1605  WBC 18.4*  HGB 11.6*  HCT 36.1  PLT 304   BMET Recent Labs  05/30/18 1605  NA 139  K 3.5  CL 103  CO2 25  GLUCOSE 87  BUN 17  CREATININE 0.72  CALCIUM 9.2   PT/INR No results for input(s): LABPROT, INR in the last 72 hours. CMP     Component Value Date/Time   NA 139 05/30/2018 1605   K 3.5 05/30/2018 1605   CL 103 05/30/2018 1605   CO2 25 05/30/2018 1605   GLUCOSE 87 05/30/2018 1605   BUN 17 05/30/2018 1605   CREATININE 0.72 05/30/2018 1605   CALCIUM 9.2 05/30/2018 1605   PROT 7.2 05/30/2018 1605   ALBUMIN 3.7 05/30/2018 1605   AST 28 05/30/2018 1605   ALT 36 05/30/2018 1605   ALKPHOS 119 05/30/2018 1605   BILITOT 1.2 05/30/2018 1605   GFRNONAA >60 05/30/2018 1605   GFRAA >60 05/30/2018 1605   Lipase  No results found  for: LIPASE  Studies/Results: Ct Abdomen Pelvis W Contrast  Result Date: 05/30/2018 CLINICAL DATA:  Acute right lower quadrant abdominal pain. EXAM: CT ABDOMEN AND PELVIS WITH CONTRAST TECHNIQUE: Multidetector CT imaging of the abdomen and pelvis was performed using the standard protocol following bolus administration of intravenous contrast. CONTRAST:  12mL ISOVUE-300 IOPAMIDOL (ISOVUE-300) INJECTION 61% COMPARISON:  None. FINDINGS: Lower chest: Moderate size sliding-type hiatal hernia is noted. Visualized lung bases are unremarkable. Hepatobiliary: No focal liver abnormality is seen. Status post cholecystectomy. No biliary dilatation. Pancreas: Unremarkable. No pancreatic ductal dilatation or surrounding inflammatory changes. Spleen: Normal in size without focal abnormality. Adrenals/Urinary Tract: Adrenal glands are unremarkable. Kidneys are normal, without renal calculi, focal lesion, or hydronephrosis. Bladder is unremarkable. Stomach/Bowel: There is no evidence of bowel obstruction. Sigmoid diverticulosis is noted without inflammation. The appendix is enlarged with surrounding inflammation consistent with acute appendicitis. Appendix: Location: Right lower quadrant. Diameter: 16 mm. Appendicolith: None. Mucosal hyper-enhancement: Yes. Extraluminal gas: No. Periappendiceal collection: No. Vascular/Lymphatic: Aortic atherosclerosis. No enlarged abdominal or pelvic lymph nodes. Reproductive: Uterus and bilateral adnexa are unremarkable. Other: No abdominal wall hernia or abnormality. No abdominopelvic ascites. Musculoskeletal: No acute or significant osseous findings. IMPRESSION: Findings consistent with acute appendicitis. No definite abscess or perforation is noted. These results will be called to the ordering clinician or representative by the Radiologist Assistant, and communication documented in the PACS or zVision Dashboard. Sigmoid diverticulosis without inflammation. Moderate size sliding-type  hiatal hernia. Aortic Atherosclerosis (ICD10-I70.0). Electronically Signed   By: Marijo Conception, M.D.   On: 05/30/2018 13:26      Kalman Drape , [facility] 06/01/2018, 8:43 AM  Pager: [phone] Mon-Wed, Friday 7:00am-4:30pm Thurs 7am-11:30am  Consults: [phone]

## 2018-06-01 NOTE — Discharge Instructions (Signed)
Please arrive at least 30 min before your appointment to complete your check in paperwork.  If you are unable to arrive 30 min prior to your appointment time we may have to cancel or reschedule you. ° °LAPAROSCOPIC SURGERY: POST OP INSTRUCTIONS  °1. DIET: Follow a light bland diet the first 24 hours after arrival home, such as soup, liquids, crackers, etc. Be sure to include lots of fluids daily. Avoid fast food or heavy meals as your are more likely to get nauseated. Eat a low fat the next few days after surgery.  °2. Take your usually prescribed home medications unless otherwise directed. °3. PAIN CONTROL:  °1. Pain is best controlled by a usual combination of three different methods TOGETHER:  °1. Ice/Heat °2. Over the counter pain medication °3. Prescription pain medication °2. Most patients will experience some swelling and bruising around the incisions. Ice packs or heating pads (30-60 minutes up to 6 times a day) will help. Use ice for the first few days to help decrease swelling and bruising, then switch to heat to help relax tight/sore spots and speed recovery. Some people prefer to use ice alone, heat alone, alternating between ice & heat. Experiment to what works for you. Swelling and bruising can take several weeks to resolve.  °3. It is helpful to take an over-the-counter pain medication regularly for the first few weeks. Choose one of the following that works best for you:  °1. Naproxen (Aleve, etc) Two 220mg tabs twice a day °2. Ibuprofen (Advil, etc) Three 200mg tabs four times a day (every meal & bedtime) °3. Acetaminophen (Tylenol, etc) 500-650mg four times a day (every meal & bedtime) °4. A prescription for pain medication (such as oxycodone, hydrocodone, etc) should be given to you upon discharge. Take your pain medication as prescribed.  °1. If you are having problems/concerns with the prescription medicine (does not control pain, nausea, vomiting, rash, itching, etc), please call us [phone] to see if we need to switch you to a different pain medicine that will work better for you and/or control your side effect better. °2. If you need a refill on your pain medication, please contact your pharmacy. They will contact our office to request authorization. Prescriptions will not be filled after 5 pm or on week-ends. °4. Avoid getting constipated. Between the surgery and the pain medications, it is common to experience some constipation. Increasing fluid intake and taking a fiber supplement (such as Metamucil, Citrucel, FiberCon, MiraLax, etc) 1-2 times a day regularly will usually help prevent this problem from occurring. A mild laxative (prune juice, Milk of Magnesia, MiraLax, etc) should be taken according to package directions if there are no bowel movements after 48 hours.  °5. Watch out for diarrhea. If you have many loose bowel movements, simplify your diet to bland foods & liquids for a few days. Stop any stool softeners and decrease your fiber supplement. Switching to mild anti-diarrheal medications (Kayopectate, Pepto Bismol) can help. If this worsens or does not improve, please call us. °6. Wash / shower every day. You may shower over the dressings as they are waterproof. Continue to shower over incision(s) after the dressing is off. °7. Remove your waterproof bandages 5 days after surgery. You may leave the incision open to air. You may replace a dressing/Band-Aid to cover the incision for comfort if you wish.  °8. ACTIVITIES as tolerated:  °1. You may resume regular (light) daily activities beginning the next day--such as daily self-care, walking, climbing stairs--gradually   increasing activities as tolerated. If you can walk 30 minutes without difficulty, it is safe to try more intense activity such as jogging, treadmill, bicycling, low-impact aerobics, swimming, etc. 2. Save the most intensive and strenuous activity for last such as sit-ups, heavy lifting, contact sports, etc Refrain  from any heavy lifting or straining until you are off narcotics for pain control.  3. DO NOT PUSH THROUGH PAIN. Let pain be your guide: If it hurts to do something, don't do it. Pain is your body warning you to avoid that activity for another week until the pain goes down. 4. You may drive when you are no longer taking prescription pain medication, you can comfortably wear a seatbelt, and you can safely maneuver your car and apply brakes. 5. You may have sexual intercourse when it is comfortable.  9. FOLLOW UP in our office  1. Please call [facility] at [phone] to set up an appointment to see your surgeon in the office for a follow-up appointment approximately 2-3 weeks after your surgery. 2. Make sure that you call for this appointment the day you arrive home to insure a convenient appointment time.      10. IF YOU HAVE DISABILITY OR FAMILY LEAVE FORMS, BRING THEM TO THE               OFFICE FOR PROCESSING.   WHEN TO CALL us [phone]:  1. Poor pain control 2. Reactions / problems with new medications (rash/itching, nausea, etc)  3. Fever over 101.5 F (38.5 C) 4. Inability to urinate 5. Nausea and/or vomiting 6. Worsening swelling or bruising 7. Continued bleeding from incision. 8. Increased pain, redness, or drainage from the incision  The clinic staff is available to answer your questions during regular business hours (8:30am-5pm). Please dont hesitate to call and ask to speak to one of our nurses for clinical concerns.  If you have a medical emergency, go to the nearest emergency room or call 911.  A surgeon from [facility] is always on call at the [facility], [place], [place], [place], [place] [address] ?  MAIN: [phone] ? TOLL FREE: [phone] ?  FAX [phone]  [email or web address]    [facility] Surgical drains are used to remove extra fluid that normally builds up in a surgical  wound after surgery. A surgical drain helps to heal a surgical wound. Different kinds of surgical drains include:  Active drains. These drains use suction to pull drainage away from the surgical wound. Drainage flows through a tube to a container outside of the body. It is important to keep the bulb or the drainage container flat (compressed) at all times, except while you empty it. Flattening the bulb or container creates suction. The two most common types of active drains are bulb drains and Hemovac drains.  Passive drains. These drains allow fluid to drain naturally, by gravity. Drainage flows through a tube to a bandage (dressing) or a container outside of the body. Passive drains do not need to be emptied. The most common type of passive drain is the Penrose drain.  A drain is placed during surgery. Immediately after surgery, drainage is usually bright red and a little thicker than water. The drainage may gradually turn yellow or pink and become thinner. It is likely that your health care provider will remove the drain when the drainage stops or when the amount decreases to 1-2 Tbsp (15-30 mL)  during a 24-hour period. How to care for your surgical drain  Keep the skin around the drain dry and covered with a dressing at all times.  Check your drain area every day for signs of infection. Check for: ? More redness, swelling, or pain. ? Pus or a bad smell. ? Cloudy drainage. Follow instructions from your health care provider about how to take care of your drain and how to change your dressing. Change your dressing at least one time every day. Change it more often if needed to keep the dressing dry. Make sure you: 1. Gather your supplies, including: ? Tape. ? Germ-free cleaning solution (sterile saline). ? Split gauze drain sponge: 4 x 4 inches (10 x 10 cm). ? Gauze square: 4 x 4 inches (10 x 10 cm). 2. Wash your hands with soap and water before you change your dressing. If soap and water are  not available, use hand sanitizer. 3. Remove the old dressing. Avoid using scissors to do that. 4. Use sterile saline to clean your skin around the drain. 5. Place the tube through the slit in a drain sponge. Place the drain sponge so that it covers your wound. 6. Place the gauze square or another drain sponge on top of the drain sponge that is on the wound. Make sure the tube is between those layers. 7. Tape the dressing to your skin. 8. If you have an active bulb or Hemovac drain, tape the drainage tube to your skin 1-2 inches (2.5-5 cm) below the place where the tube enters your body. Taping keeps the tube from pulling on any stitches (sutures) that you have. 9. Wash your hands with soap and water. 10. Write down the color of your drainage and how often you change your dressing.  How to empty your active bulb or Hemovac drain 1. Make sure that you have a measuring cup that you can empty your drainage into. 2. Wash your hands with soap and water. If soap and water are not available, use hand sanitizer. 3. Gently move your fingers down the tube while squeezing very lightly. This is called stripping the tube. This clears any drainage, clots, or tissue from the tube. ? Do not pull on the tube. ? You may need to strip the tube several times every day to keep the tube clear. 4. Open the bulb cap or the drain plug. Do not touch the inside of the cap or the bottom of the plug. 5. Empty all of the drainage into the measuring cup. 6. Compress the bulb or the container and replace the cap or the plug. To compress the bulb or the container, squeeze it firmly in the middle while you close the cap or plug the container. 7. Write down the amount of drainage that you have in each 24-hour period. If you have less than 2 Tbsp (30 mL) of drainage during 24 hours, contact your health care provider. 8. Flush the drainage down the toilet. 9. Wash your hands with soap and water. Contact a health care provider  if:  You have more redness, swelling, or pain around your drain area.  The amount of drainage that you have is increasing instead of decreasing.  You have pus or a bad smell coming from your drain area.  You have a fever.  You have drainage that is cloudy.  There is a sudden stop or a sudden decrease in the amount of drainage that you have.  Your tube falls out.  Your active  draindoes not stay compressedafter you empty it. This information is not intended to replace advice given to you by your health care provider. Make sure you discuss any questions you have with your health care provider. Document Released: 06/24/2000 Document Revised: 12/03/2015 Document Reviewed: 01/14/2015 Elsevier Interactive Patient Education  2018 [employer].

## 2018-06-01 NOTE — Progress Notes (Signed)
Episode wheezing during the evening.  Respiratory was notified but patient refused as first.  Then when it became worse, She asked nurse to call to get the treatment.  Patient went to sleep thereafter and when awoke with wheezing.  Respiratory therapy was notified and treatment given.  2L of 02 was applied and monitored. Sats remain 94 to 100% throughout.  Reported to am nurse to follow up with MD

## 2018-06-02 MED ORDER — METRONIDAZOLE 500 MG PO TABS: 500.0000 mg | ORAL_TABLET | Freq: Three times a day (TID) | ORAL | 0 refills | Status: DC

## 2018-06-02 MED ORDER — CIPROFLOXACIN HCL 500 MG PO TABS: 500.0000 mg | ORAL_TABLET | Freq: Two times a day (BID) | ORAL | 0 refills | Status: DC

## 2018-06-02 MED ORDER — TRAMADOL HCL 50 MG PO TABS: 50.0000 mg | ORAL_TABLET | Freq: Four times a day (QID) | ORAL | 0 refills | Status: DC | PRN

## 2018-06-02 NOTE — Discharge Summary (Signed)
Physician Discharge Summary  Emma [name] YKZ:[id number] DOB: 1941-04-26 DOA: 05/30/2018  PCP: Shon Baton, MD  Admit date: 05/30/2018 Discharge date: 06/02/2018  Recommendations for Outpatient Follow-up:    [facility] Go on 06/05/2018.   Specialty:  General Surgery Why:  06/05/18 1:45 pm. Please arrive at 1:30 to complete paperwork. Please bring photo id and insurance card Contact information: [address] [place] [phone]         Discharge Diagnoses:  1. Gangrenous appendicitis 2. Hypertension 3. psoriasis  Surgical Procedure: lap appendectomy Dr Marcello Moores 05/30/2018  Discharge Condition: good Disposition: home  Diet recommendation: regular  Filed Weights   05/30/18 1456 05/30/18 1809  Weight: 69.4 kg 69.4 kg    History of present illness:  Pt is a 77 yo female with a history of C section, lap chole, allergies, HTN who presented to the ED under the advice of her PCP after a CT scan today showed appendicitis. Pt began having mid abdominal pain early Monday morning. Mild pain, non radiating, that progressed to constant, severe with touch with associated temp of 99-100F. Pt had diarrhea over the weekend otherwise stools have been normal. No nausea, vomiting, chills, CP, SOB or other associated symptoms. No anticoagulation. Last meal was 0800 this am.   Hospital Course:  She was taken to the operating room and found to have gangrenous appendicitis.  Laparoscopic appendectomy was performed with drain placement.  She was admitted for ongoing antibiotic therapy and observation.  Physical therapy and Occupational Therapy were consulted.  They did not feel she needed home health PT.  Her diet was slowly advanced.  On Friday she had had some nausea so plan was to keep an addl day. On day of discharge she was tolerating a diet.  She had no nausea or vomiting.  She was having bowel  movements.  Her vital signs were stable.  The plan is for her to go home with surgical drain and continued oral antibiotics for a few days.  I discussed discharge instructions with the patient.  BP 135/71 (BP Location: Left Arm)   Pulse 75   Temp 97.8 F (36.6 C) (Oral)   Resp 20   Ht 5\' 1"  (1.549 m)   Wt 69.4 kg   LMP 12/09/1997 (Approximate)   SpO2 97%   BMI 28.91 kg/m   Gen: alert, NAD, non-toxic appearing Pupils: equal, no scleral icterus Pulm: Lungs clear to auscultation, symmetric chest rise CV: regular rate and rhythm Abd: soft, min tender, nondistended. Drain - serosang.  No cellulitis. No incisional hernia Ext: no edema, no calf tenderness Skin: no rash, no jaundice    Discharge Instructions  Discharge Instructions    Call MD for:   Complete by:  As directed    Temperature >101   Call MD for:  hives   Complete by:  As directed    Call MD for:  persistant dizziness or light-headedness   Complete by:  As directed    Call MD for:  persistant nausea and vomiting   Complete by:  As directed    Call MD for:  redness, tenderness, or signs of infection (pain, swelling, redness, odor or green/yellow discharge around incision site)   Complete by:  As directed    Call MD for:  severe uncontrolled pain   Complete by:  As directed    Diet general   Complete by:  As directed  Discharge instructions   Complete by:  As directed    See [facility] discharge instructions   Increase activity slowly   Complete by:  As directed      Allergies as of 06/02/2018      Reactions   Penicillins Hives, Rash, Other (See Comments)   PATIENT HAS HAD A PCN REACTION WITH IMMEDIATE RASH, FACIAL/TONGUE/THROAT SWELLING, SOB, OR LIGHTHEADEDNESS WITH HYPOTENSION:  #  #  #  YES  #  #  #   Has patient had a PCN reaction causing severe rash involving mucus membranes or skin necrosis: no Has patient had a PCN reaction that required hospitalization: no Has patient had a PCN reaction occurring  within the last 10 years: #  #  #  YES  #  #  #    Moxifloxacin    UNSPECIFIED REACTION       Medication List    STOP taking these medications   aspirin 325 MG tablet   sulfamethoxazole-trimethoprim 800-160 MG tablet Commonly known as:  BACTRIM DS,SEPTRA DS     TAKE these medications   acetaminophen 500 MG tablet Commonly known as:  TYLENOL Take 1,000 mg by mouth every 6 (six) hours as needed for mild pain or moderate pain.   ADVAIR DISKUS 250-50 MCG/DOSE Aepb Generic drug:  Fluticasone-Salmeterol Inhale 1 puff into the lungs daily.   cholecalciferol 1000 units tablet Commonly known as:  VITAMIN D Take 1,000 Units by mouth daily.   ciprofloxacin 500 MG tablet Commonly known as:  CIPRO Take 1 tablet (500 mg total) by mouth 2 (two) times daily.   folic acid 1 MG tablet Commonly known as:  FOLVITE Take 1 mg by mouth daily.   gabapentin 300 MG capsule Commonly known as:  NEURONTIN Take 300 mg by mouth at bedtime.   ibuprofen 200 MG tablet Commonly known as:  ADVIL,MOTRIN Take 200 mg by mouth daily as needed for fever.   KLOR-CON M20 20 MEQ tablet Generic drug:  potassium chloride SA Take 20 mEq by mouth 2 (two) times daily.   losartan-hydrochlorothiazide 100-12.5 MG tablet Commonly known as:  HYZAAR Take 1 tablet by mouth daily.   methotrexate 2.5 MG tablet Commonly known as:  RHEUMATREX Take 2.5-5 mg by mouth once a week. Sunday   metroNIDAZOLE 500 MG tablet Commonly known as:  FLAGYL Take 1 tablet (500 mg total) by mouth every 8 (eight) hours.   montelukast 10 MG tablet Commonly known as:  SINGULAIR Take 10 mg by mouth at bedtime.   SYSTANE BALANCE OP Place 1 drop into both eyes daily.   traMADol 50 MG tablet Commonly known as:  ULTRAM Take 1 tablet (50 mg total) by mouth every 6 (six) hours as needed for severe pain.   Vitamin D (Ergocalciferol) 1.25 MG (50000 UT) Caps capsule Commonly known as:  DRISDOL Take 50,000 Units by mouth every 7 (seven)  days. Sunday      Follow-up [facility]. Go on 06/05/2018.   Specialty:  General Surgery Why:  06/05/18 1:45 pm. Please arrive at 1:30 to complete paperwork. Please bring photo id and insurance card Contact information: [address] [place] [phone]           The results of significant diagnostics from this hospitalization (including imaging, microbiology, ancillary and laboratory) are listed below for reference.    Significant Diagnostic Studies: Ct Abdomen Pelvis W Contrast  Result Date: 05/30/2018 CLINICAL DATA:  Acute right lower quadrant abdominal pain. EXAM: CT ABDOMEN AND PELVIS WITH CONTRAST TECHNIQUE: Multidetector CT imaging of the abdomen and pelvis was performed using the standard protocol following bolus administration of intravenous contrast. CONTRAST:  154mL ISOVUE-300 IOPAMIDOL (ISOVUE-300) INJECTION 61% COMPARISON:  None. FINDINGS: Lower chest: Moderate size sliding-type hiatal hernia is noted. Visualized lung bases are unremarkable. Hepatobiliary: No focal liver abnormality is seen. Status post cholecystectomy. No biliary dilatation. Pancreas: Unremarkable. No pancreatic ductal dilatation or surrounding inflammatory changes. Spleen: Normal in size without focal abnormality. Adrenals/Urinary Tract: Adrenal glands are unremarkable. Kidneys are normal, without renal calculi, focal lesion, or hydronephrosis. Bladder is unremarkable. Stomach/Bowel: There is no evidence of bowel obstruction. Sigmoid diverticulosis is noted without inflammation. The appendix is enlarged with surrounding inflammation consistent with acute appendicitis. Appendix: Location: Right lower quadrant. Diameter: 16 mm. Appendicolith: None. Mucosal hyper-enhancement: Yes. Extraluminal gas: No. Periappendiceal collection: No. Vascular/Lymphatic: Aortic atherosclerosis. No enlarged abdominal or pelvic lymph nodes. Reproductive:  Uterus and bilateral adnexa are unremarkable. Other: No abdominal wall hernia or abnormality. No abdominopelvic ascites. Musculoskeletal: No acute or significant osseous findings. IMPRESSION: Findings consistent with acute appendicitis. No definite abscess or perforation is noted. These results will be called to the ordering clinician or representative by the Radiologist Assistant, and communication documented in the PACS or zVision Dashboard. Sigmoid diverticulosis without inflammation. Moderate size sliding-type hiatal hernia. Aortic Atherosclerosis (ICD10-I70.0). Electronically Signed   By: Marijo Conception, M.D.   On: 05/30/2018 13:26    Microbiology: No results found for this or any previous visit (from the past 240 hour(s)).   Labs: Basic Metabolic Panel: Recent Labs  Lab 05/30/18 1605  NA 139  K 3.5  CL 103  CO2 25  GLUCOSE 87  BUN 17  CREATININE 0.72  CALCIUM 9.2   Liver Function Tests: Recent Labs  Lab 05/30/18 1605  AST 28  ALT 36  ALKPHOS 119  BILITOT 1.2  PROT 7.2  ALBUMIN 3.7   No results for input(s): LIPASE, AMYLASE in the last 168 hours. No results for input(s): AMMONIA in the last 168 hours. CBC: Recent Labs  Lab 05/30/18 1605  WBC 18.4*  NEUTROABS 15.1*  HGB 11.6*  HCT 36.1  MCV 91.9  PLT 304   Cardiac Enzymes: No results for input(s): CKTOTAL, CKMB, CKMBINDEX, TROPONINI in the last 168 hours. BNP: BNP (last 3 results) No results for input(s): BNP in the last 8760 hours.  ProBNP (last 3 results) No results for input(s): PROBNP in the last 8760 hours.  CBG: No results for input(s): GLUCAP in the last 168 hours.  Principal Problem:   Acute gangrenous appendicitis s/p lap appendectomy 05/30/2018 Active Problems:   Acute bronchitis   COPD (chronic obstructive pulmonary disease) ([place])   Gangrenous appendicitis   Time coordinating discharge: 15 min  Signed:  Gayland Curry, MD [facility], [place] [phone] 06/02/2018,  12:10 PM

## 2018-06-02 NOTE — Progress Notes (Signed)
Discharge information discussed with pt/son, instructed, demonstrated and allowed return demo of JP drain care. No PIV, denies any questions or concerns. Pt discharged and home with her son

## 2018-07-23 DIAGNOSIS — M180 Bilateral primary osteoarthritis of first carpometacarpal joints: Secondary | ICD-10-CM | POA: Insufficient documentation

## 2018-07-24 ENCOUNTER — Other Ambulatory Visit: Payer: Self-pay | Admitting: *Deleted

## 2018-07-24 ENCOUNTER — Encounter: Payer: Self-pay | Admitting: Neurology

## 2018-07-24 DIAGNOSIS — R200 Anesthesia of skin: Secondary | ICD-10-CM

## 2018-08-09 ENCOUNTER — Ambulatory Visit (INDEPENDENT_AMBULATORY_CARE_PROVIDER_SITE_OTHER): Payer: Medicare Other | Admitting: Neurology

## 2018-08-09 ENCOUNTER — Encounter

## 2018-08-09 DIAGNOSIS — G5603 Carpal tunnel syndrome, bilateral upper limbs: Secondary | ICD-10-CM

## 2018-08-09 DIAGNOSIS — R200 Anesthesia of skin: Secondary | ICD-10-CM | POA: Diagnosis not present

## 2018-08-09 DIAGNOSIS — M5412 Radiculopathy, cervical region: Secondary | ICD-10-CM

## 2018-08-09 NOTE — Procedures (Signed)
[facility]  [place], [address] [address]  [place], [place] [address] Tel: [phone] Fax:  [phone] Test Date:  08/09/2018  Patient: Emma [name] DOB: 1940-12-29 Physician: Narda Amber, DO  Sex: Female Height: 5\' 1"  Ref Phys: Daryll Brod, MD  ID#: [id number] Temp: 34.0C Technician:    Patient Complaints: This is a 78 year old female referred for evaluation of bilateral hand numbness and tingling, worse on the left.  NCV & EMG Findings: Extensive electrodiagnostic testing of the left upper extremity and additional studies of the right shows:  1. Left median sensory response shows prolonged distal peak latency (5.4 ms) and reduced amplitude (7.7 V).  Right mixed palmar sensory responses show prolonged latency.  Right median and bilateral ulnar sensory responses are within normal limits. 2. Left median motor response shows prolonged latency (6.2 ms); right median motor response is within normal limits; of note, there is evidence of a median-to-ulnar crossover in the forearm as seen by a motor response stimulating at the ulnar-wrist and recording at the abductor pollicis brevis muscle, consistent with a Martin-Gruber anastomosis.  Bilateral ulnar motor responses are within normal limits.   3. Chronic motor axonal loss changes are seen affecting the left C5-7 myotomes, without accompanied active denervation.  There is no evidence of active or chronic motor axonal loss changes affecting the abductor pollicis brevis muscles.    Impression: 1. Left median neuropathy at or distal to the wrist, consistent with a clinical diagnosis of carpal tunnel syndrome, moderate-to-severe in degree electrically. 2. 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 3. Very mild right median neuropathy at or distal to the wrist, consistent with a clinical diagnosis of carpal tunnel syndrome.  4. 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    ___________________________ Narda Amber, DO    Nerve Conduction Studies Anti Sensory Summary Table   Site NR Peak (ms) Norm Peak (ms) P-T Amp (V) Norm P-T Amp  Left Median Anti Sensory (2nd Digit)  34C  Wrist    5.4 <3.8 7.7 >10  Right Median Anti Sensory (2nd Digit)  34C  Wrist    3.2 <3.8 26.9 >10  Left Ulnar Anti Sensory (5th Digit)  34C  Wrist    2.5 <3.2 39.1 >5  Right Ulnar Anti Sensory (5th Digit)  34C  Wrist    2.3 <3.2 42.0 >5   Motor Summary Table   Site NR Onset (ms) Norm Onset (ms) O-P Amp (mV) Norm O-P Amp Site1 Site2 Delta-0 (ms) Dist (cm) Vel (m/s) Norm Vel (m/s)  Left Median Motor (Abd Poll Brev)  34C  Wrist    6.2 <4.0 6.4 >5 Elbow Wrist 2.6 26.0 100 >50  Elbow    8.8  5.2  Ulnar-wrist crossover Elbow 5.2 0.0    Ulnar-wrist crossover    3.6  5.0         Right Median Motor (Abd Poll Brev)  34C  Wrist    3.4 <4.0 5.7 >5 Elbow Wrist 4.9 26.0 53 >50  Elbow    8.3  6.7  Ulnar-wrist crossover Elbow 4.7 0.0    Ulnar-wrist crossover    3.6  4.2         Left Ulnar Motor (Abd Dig Minimi)  34C  Wrist    2.2 <3.1 9.1 >7 B Elbow Wrist 2.9 22.0 76 >50  B Elbow    5.1  7.8  A Elbow B Elbow 1.5 10.0 67 >50  A Elbow    6.6  8.5         Right Ulnar Motor (Abd Dig Minimi)  34C  Wrist    2.3 <3.1 9.7 >7 B Elbow Wrist 3.2 22.0 69 >50  B Elbow    5.5  9.1  A Elbow B Elbow 1.5 10.0 67 >50  A Elbow    7.0  8.5          Comparison Summary Table   Site NR Peak (ms) Norm Peak (ms) P-T Amp (V) Site1 Site2 Delta-P (ms) Norm Delta (ms)  Right Median/Ulnar Palm Comparison (Wrist - 8cm)  34C  Median Palm    2.4 <2.2 20.3 Median Palm Ulnar Palm 1.0   Ulnar Palm    1.4 <2.2 15.4       EMG   Side Muscle Ins Act Fibs Psw Fasc Number Recrt Dur Dur. Amp Amp. Poly Poly. Comment  Right 1stDorInt Nml Nml Nml Nml Nml Nml Nml Nml Nml Nml Nml Nml N/A  Left 1stDorInt Nml Nml Nml Nml Nml Nml Nml Nml Nml Nml Nml Nml N/A  Left Abd Poll Brev  Nml Nml Nml Nml Nml Nml Nml Nml Nml Nml Nml Nml N/A  Left PronatorTeres Nml Nml Nml Nml 1- Rapid Few 1+ Few 1+ Nml Nml N/A  Left Biceps Nml Nml Nml Nml 1- Rapid Few 1+ Few 1+ Nml Nml N/A  Left Triceps Nml Nml Nml Nml 1- Rapid Some 1+ Some 1+ Nml Nml N/A  Left Deltoid Nml Nml Nml Nml 1- Rapid Some 1+ Some 1+ Nml Nml N/A  Right Abd Poll Brev Nml Nml Nml Nml Nml Nml Nml Nml Nml Nml Nml Nml N/A  Right PronatorTeres Nml Nml Nml Nml Nml Nml Nml Nml Nml Nml Nml Nml N/A  Right Biceps Nml Nml Nml Nml Nml Nml Nml Nml Nml Nml Nml Nml N/A  Right Triceps Nml Nml Nml Nml Nml Nml Nml Nml Nml Nml Nml Nml N/A  Right Deltoid Nml Nml Nml Nml Nml Nml Nml Nml Nml Nml Nml Nml N/A      Waveforms:

## 2018-08-29 ENCOUNTER — Telehealth: Payer: Self-pay | Admitting: Obstetrics and Gynecology

## 2018-08-29 NOTE — Telephone Encounter (Addendum)
Spoke with patient.  She states she had diarrhea into January after ruptured appendicitis. Feels she may have developed hemorrhoids.  Having increased anal itching. Cannot feel anything in rectal area. Denies vaginal itching. No fevers or pelvic pain. No longer having diarrhea.  Used some OTC preparation H. Helped some.  She declines office visit at this time. Wants to continue comfort measures and asks for nursing advice.  Discussed hemorrhoids and home care. Go to the bathroom when you have the urge to have a bowel movement. Do not wait. Avoid straining to have bowel movements. Keep the anal area dry and clean. Use wet toilet paper or moist towelettes after a bowel movement.Do not sit on the toilet for long periods of time. Hydrate well and fibrous diet.  Office visit if not improved. Pt verbalized understanding. Will try to continue with OTC products and return call for office visit as needed.   Encounter to Dr. Talbert Nan and will close.

## 2018-08-29 NOTE — Telephone Encounter (Signed)
Patient believes she has hemorrhoids. Patient stated that she has never had them before and is unsure how to treat them.

## 2018-10-12 DIAGNOSIS — M5030 Other cervical disc degeneration, unspecified cervical region: Secondary | ICD-10-CM | POA: Insufficient documentation

## 2018-10-31 ENCOUNTER — Ambulatory Visit: Payer: Medicare Other | Admitting: Obstetrics and Gynecology

## 2018-11-27 ENCOUNTER — Other Ambulatory Visit: Payer: Self-pay

## 2018-11-29 ENCOUNTER — Other Ambulatory Visit: Payer: Self-pay

## 2018-11-29 ENCOUNTER — Encounter: Payer: Self-pay | Admitting: Obstetrics and Gynecology

## 2018-11-29 ENCOUNTER — Ambulatory Visit: Payer: Medicare Other | Admitting: Obstetrics and Gynecology

## 2018-11-29 VITALS — BP 120/74 | HR 86 | Temp 97.4°F | Ht 59.5 in | Wt 156.2 lb

## 2018-11-29 DIAGNOSIS — M8580 Other specified disorders of bone density and structure, unspecified site: Secondary | ICD-10-CM | POA: Diagnosis not present

## 2018-11-29 DIAGNOSIS — E559 Vitamin D deficiency, unspecified: Secondary | ICD-10-CM | POA: Diagnosis not present

## 2018-11-29 DIAGNOSIS — Z01419 Encounter for gynecological examination (general) (routine) without abnormal findings: Secondary | ICD-10-CM

## 2018-11-29 NOTE — Patient Instructions (Signed)
EXERCISE AND DIET:  We recommended that you start or continue a regular exercise program for good health. Regular exercise means any activity that makes your heart beat faster and makes you sweat.  We recommend exercising at least 30 minutes per day at least 3 days a week, preferably 4 or 5.  We also recommend a diet low in fat and sugar.  Inactivity, poor dietary choices and obesity can cause diabetes, heart attack, stroke, and kidney damage, among others.    ALCOHOL AND SMOKING:  Women should limit their alcohol intake to no more than 7 drinks/beers/glasses of wine (combined, not each!) per week. Moderation of alcohol intake to this level decreases your risk of breast cancer and liver damage. And of course, no recreational drugs are part of a healthy lifestyle.  And absolutely no smoking or even second hand smoke. Most people know smoking can cause heart and lung diseases, but did you know it also contributes to weakening of your bones? Aging of your skin?  Yellowing of your teeth and nails?  CALCIUM AND VITAMIN D:  Adequate intake of calcium and Vitamin D are recommended.  The recommendations for exact amounts of these supplements seem to change often, but generally speaking 1,200 mg of calcium (between diet and supplement) and 800 units of Vitamin D per day seems prudent. Certain women may benefit from higher intake of Vitamin D.  If you are among these women, your doctor will have told you during your visit.    PAP SMEARS:  Pap smears, to check for cervical cancer or precancers,  have traditionally been done yearly, although recent scientific advances have shown that most women can have pap smears less often.  However, every woman still should have a physical exam from her gynecologist every year. It will include a breast check, inspection of the vulva and vagina to check for abnormal growths or skin changes, a visual exam of the cervix, and then an exam to evaluate the size and shape of the uterus and  ovaries.  And after 79 years of age, a rectal exam is indicated to check for rectal cancers. We will also provide age appropriate advice regarding health maintenance, like when you should have certain vaccines, screening for sexually transmitted diseases, bone density testing, colonoscopy, mammograms, etc.   MAMMOGRAMS:  All women over 40 years old should have a yearly mammogram. Many facilities now offer a "3D" mammogram, which may cost around $50 extra out of pocket. If possible,  we recommend you accept the option to have the 3D mammogram performed.  It both reduces the number of women who will be called back for extra views which then turn out to be normal, and it is better than the routine mammogram at detecting truly abnormal areas.    COLON CANCER SCREENING: Now recommend starting at age 45. At this time colonoscopy is not covered for routine screening until 50. There are take home tests that can be done between 45-49.   COLONOSCOPY:  Colonoscopy to screen for colon cancer is recommended for all women at age 50.  We know, you hate the idea of the prep.  We agree, BUT, having colon cancer and not knowing it is worse!!  Colon cancer so often starts as a polyp that can be seen and removed at colonscopy, which can quite literally save your life!  And if your first colonoscopy is normal and you have no family history of colon cancer, most women don't have to have it again for   10 years.  Once every ten years, you can do something that may end up saving your life, right?  We will be happy to help you get it scheduled when you are ready.  Be sure to check your insurance coverage so you understand how much it will cost.  It may be covered as a preventative service at no cost, but you should check your particular policy.      Breast Self-Awareness Breast self-awareness means being familiar with how your breasts look and feel. It involves checking your breasts regularly and reporting any changes to your  health care provider. Practicing breast self-awareness is important. A change in your breasts can be a sign of a serious medical problem. Being familiar with how your breasts look and feel allows you to find any problems early, when treatment is more likely to be successful. All women should practice breast self-awareness, including women who have had breast implants. How to do a breast self-exam One way to learn what is normal for your breasts and whether your breasts are changing is to do a breast self-exam. To do a breast self-exam: Look for Changes  1. Remove all the clothing above your waist. 2. Stand in front of a mirror in a room with good lighting. 3. Put your hands on your hips. 4. Push your hands firmly downward. 5. Compare your breasts in the mirror. Look for differences between them (asymmetry), such as: ? Differences in shape. ? Differences in size. ? Puckers, dips, and bumps in one breast and not the other. 6. Look at each breast for changes in your skin, such as: ? Redness. ? Scaly areas. 7. Look for changes in your nipples, such as: ? Discharge. ? Bleeding. ? Dimpling. ? Redness. ? A change in position. Feel for Changes Carefully feel your breasts for lumps and changes. It is best to do this while lying on your back on the floor and again while sitting or standing in the shower or tub with soapy water on your skin. Feel each breast in the following way:  Place the arm on the side of the breast you are examining above your head.  Feel your breast with the other hand.  Start in the nipple area and make  inch (2 cm) overlapping circles to feel your breast. Use the pads of your three middle fingers to do this. Apply light pressure, then medium pressure, then firm pressure. The light pressure will allow you to feel the tissue closest to the skin. The medium pressure will allow you to feel the tissue that is a little deeper. The firm pressure will allow you to feel the tissue  close to the ribs.  Continue the overlapping circles, moving downward over the breast until you feel your ribs below your breast.  Move one finger-width toward the center of the body. Continue to use the  inch (2 cm) overlapping circles to feel your breast as you move slowly up toward your collarbone.  Continue the up and down exam using all three pressures until you reach your armpit.  Write Down What You Find  Write down what is normal for each breast and any changes that you find. Keep a written record with breast changes or normal findings for each breast. By writing this information down, you do not need to depend only on memory for size, tenderness, or location. Write down where you are in your menstrual cycle, if you are still menstruating. If you are having trouble noticing differences   in your breasts, do not get discouraged. With time you will become more familiar with the variations in your breasts and more comfortable with the exam. How often should I examine my breasts? Examine your breasts every month. If you are breastfeeding, the best time to examine your breasts is after a feeding or after using a breast pump. If you menstruate, the best time to examine your breasts is 5-7 days after your period is over. During your period, your breasts are lumpier, and it may be more difficult to notice changes. When should I see my health care provider? See your health care provider if you notice:  A change in shape or size of your breasts or nipples.  A change in the skin of your breast or nipples, such as a reddened or scaly area.  Unusual discharge from your nipples.  A lump or thick area that was not there before.  Pain in your breasts.  Anything that concerns you.  

## 2018-11-29 NOTE — Progress Notes (Signed)
78 y.o. G32P2002 Divorced White or Caucasian [name] here for annual exam.   No vaginal bleeding, not sexually active. GSI with valsalva, intermittent, occurs a couple of times a week. Will wear a pad when she is going out.      Patient's last menstrual period was 12/09/1997 (approximate).          Sexually active: No.  The current method of family planning is post menopausal status.    Exercising: Yes.    walking, yard work Smoker:  no  Health Maintenance: Pap:  10/13/2017 WNL, 09-16-14 WNL  History of abnormal Pap:  H/O CIN I/VAIN I in the 90's MMG: 10/18/2017 Birads 2 benign Colonoscopy:  01-29-14 polyps, was told no more colonoscopies.   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 FRAX 12% for any fracture and 3.6% for hip fracture Was on Evista many year past. Was counseled on Fosamax last year, declined.  TDaP: 2016   Gardasil: N/A   reports that she quit smoking about 15 months ago. Her smoking use included cigarettes. She has a 12.50 pack-year smoking history. She has never used smokeless tobacco. She reports current alcohol use of about 7.0 standard drinks of alcohol per week. She reports that she does not use drugs. 2 kids, both local. 5 grand children.  Past Medical History:  Diagnosis Date  . Allergy   . Asthma    as child  . 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 pt. unsure of pathology  . Carpal tunnel syndrome   . Cataract   . Condyloma   . Constipation   . Hx of adenomatous colonic polyps   . Hypertension   . Hypothyroidism    2016- synthroid d/c'd   . Osteoarthritis    OA- knee- L knee, R thumb  . Osteopenia   . Thyroid disease   . Vitamin D deficiency     Past Surgical History:  Procedure Laterality Date  . BELPHAROPTOSIS REPAIR Bilateral 04/2015   vision correction  . BREAST BIOPSY     right   . CATARACT EXTRACTION, BILATERAL Bilateral  01/2014, 02/2014   /w IOL  . CESAREAN SECTION     x 2   . CHOLECYSTECTOMY    . EYE SURGERY    . GALLBLADDER SURGERY    . LAPAROSCOPIC APPENDECTOMY N/A 05/30/2018   Procedure: APPENDECTOMY LAPAROSCOPIC;  Surgeon: Leighton Ruff, MD;  Location: [facility];  Service: General;  Laterality: N/A;  . SHOULDER SURGERY  2006   right   . SHOULDER SURGERY Left 07/17/2013    bone spurs, and partial tear of rotator cuff  . TOTAL KNEE ARTHROPLASTY Left 08/14/2017   Procedure: LEFT TOTAL KNEE ARTHROPLASTY;  Surgeon: Vickey Huger, MD;  Location: [facility];  Service: Orthopedics;  Laterality: Left;  . TUBAL LIGATION      Current Outpatient Medications  Medication Sig Dispense Refill  . acetaminophen (TYLENOL) 500 MG tablet Take 1,000 mg by mouth every 6 (six) hours as needed for mild pain or moderate pain.    Marland Kitchen ADVAIR DISKUS 250-50 MCG/DOSE AEPB Inhale 1 puff into the lungs daily.     . cholecalciferol (VITAMIN D) 1000 units tablet Take 1,000 Units by mouth daily.    . folic acid (FOLVITE) 1 MG tablet Take 1 mg by mouth daily.    Marland Kitchen gabapentin (NEURONTIN) 300 MG capsule  Take 300 mg by mouth at bedtime.     Marland Kitchen ibuprofen (ADVIL,MOTRIN) 200 MG tablet Take 200 mg by mouth daily as needed for fever.    Marland Kitchen KLOR-CON M20 20 MEQ tablet Take 20 mEq by mouth 2 (two) times daily.     Marland Kitchen losartan-hydrochlorothiazide (HYZAAR) 100-12.5 MG tablet Take 1 tablet by mouth daily.  5  . methotrexate (RHEUMATREX) 2.5 MG tablet Take 2.5-5 mg by mouth once a week. Sunday    . montelukast (SINGULAIR) 10 MG tablet Take 10 mg by mouth at bedtime.    Marland Kitchen Propylene Glycol (SYSTANE BALANCE OP) Place 1 drop into both eyes daily.    . Vitamin D, Ergocalciferol, (DRISDOL) 50000 UNITS CAPS Take 50,000 Units by mouth every 7 (seven) days. Sunday     No current facility-administered medications for this visit.     Family History  Problem Relation Age of Onset  . Heart disease Father   . Diabetes Paternal Grandfather   . Asthma Son   . Colon  cancer Neg Hx     Review of Systems  Constitutional: Negative.   HENT: Negative.   Eyes: Negative.   Respiratory: Negative.   Cardiovascular: Negative.   Gastrointestinal:       Hemorrhoids  Endocrine: Negative.   Genitourinary: Negative.   Musculoskeletal: Negative.   Skin: Negative.   Allergic/Immunologic: Negative.   Neurological: Negative.   Hematological: Negative.   Psychiatric/Behavioral: Negative.   Hemorrhoids bothering her off and on since her appendix was removed in 11/9. No bleeding, currently okay.   Exam:   BP 120/74 (BP Location: Left Arm, Patient Position: Sitting, Cuff Size: Normal)   Pulse 86   Temp (!) 97.4 F (36.3 C) (Skin)   Ht 4' 11.5" (1.511 m)   Wt 156 lb 3.2 oz (70.9 kg)   LMP 12/09/1997 (Approximate)   BMI 31.02 kg/m   Weight change: @WEIGHTCHANGE @ Height:   Height: 4' 11.5" (151.1 cm)  Ht Readings from Last 3 Encounters:  11/29/18 4' 11.5" (1.511 m)  05/30/18 5\' 1"  (1.549 m)  10/11/17 4\' 11"  (1.499 m)    General appearance: alert, cooperative and appears stated age Head: Normocephalic, without obvious abnormality, atraumatic Neck: no adenopathy, supple, symmetrical, trachea midline and thyroid normal to inspection and palpation Lungs: clear to auscultation bilaterally Cardiovascular: regular rate and rhythm Breasts: normal appearance, no masses or tenderness Abdomen: soft, non-tender; non distended,  no masses,  no organomegaly Extremities: extremities normal, atraumatic, no cyanosis or edema Skin: Skin color, texture, turgor normal. No rashes or lesions Lymph nodes: Cervical, supraclavicular, and axillary nodes normal. No abnormal inguinal nodes palpated Neurologic: Grossly normal   Pelvic: External genitalia:  no lesions              Urethra:  normal appearing urethra with no masses, tenderness or lesions              Bartholins and Skenes: normal                 Vagina: atrophic appearing vagina with normal color and discharge, no  lesions              Cervix: no lesions               Bimanual Exam:  Uterus:  normal size, contour, position, consistency, mobility, non-tender              Adnexa: no mass, fullness, tenderness  Rectovaginal: Confirms               Anus:  normal sphincter tone, no lesions, small anal tag, no external hemorrhoids.   Chaperone was present for exam.  A:  Well Woman with normal exam  Osteopenia with increased risk of fracture, declines treatment  P:   DEXA due  Mammogram scheduled  Discussed breast self exam  Discussed calcium and vit D intake  Labs with primary

## 2018-12-13 ENCOUNTER — Encounter: Payer: Self-pay | Admitting: Obstetrics and Gynecology

## 2018-12-17 ENCOUNTER — Ambulatory Visit (HOSPITAL_COMMUNITY)
Admission: RE | Admit: 2018-12-17 | Discharge: 2018-12-17 | Disposition: A | Discharge status: Home / Self Care | Payer: Medicare Other | Source: Ambulatory Visit | Attending: Family | Admitting: Family

## 2018-12-17 ENCOUNTER — Other Ambulatory Visit: Payer: Self-pay

## 2018-12-17 ENCOUNTER — Other Ambulatory Visit (HOSPITAL_COMMUNITY): Payer: Self-pay | Admitting: Internal Medicine

## 2018-12-17 DIAGNOSIS — M7989 Other specified soft tissue disorders: Secondary | ICD-10-CM | POA: Diagnosis not present

## 2018-12-31 ENCOUNTER — Encounter (HOSPITAL_COMMUNITY): Payer: Self-pay | Admitting: Emergency Medicine

## 2018-12-31 ENCOUNTER — Observation Stay (HOSPITAL_COMMUNITY)
Admission: EM | Admit: 2018-12-31 | Discharge: 2019-01-02 | Disposition: A | Discharge status: Home / Self Care | Payer: Medicare Other | Attending: Internal Medicine | Admitting: Internal Medicine

## 2018-12-31 ENCOUNTER — Emergency Department (HOSPITAL_COMMUNITY): Payer: Medicare Other

## 2018-12-31 DIAGNOSIS — J449 Chronic obstructive pulmonary disease, unspecified: Secondary | ICD-10-CM | POA: Diagnosis present

## 2018-12-31 DIAGNOSIS — I10 Essential (primary) hypertension: Secondary | ICD-10-CM | POA: Diagnosis not present

## 2018-12-31 DIAGNOSIS — R079 Chest pain, unspecified: Secondary | ICD-10-CM | POA: Diagnosis not present

## 2018-12-31 DIAGNOSIS — Z7951 Long term (current) use of inhaled steroids: Secondary | ICD-10-CM | POA: Insufficient documentation

## 2018-12-31 DIAGNOSIS — R945 Abnormal results of liver function studies: Secondary | ICD-10-CM | POA: Diagnosis not present

## 2018-12-31 DIAGNOSIS — Z791 Long term (current) use of non-steroidal anti-inflammatories (NSAID): Secondary | ICD-10-CM | POA: Diagnosis not present

## 2018-12-31 DIAGNOSIS — F1090 Alcohol use, unspecified, uncomplicated: Secondary | ICD-10-CM

## 2018-12-31 DIAGNOSIS — I2510 Atherosclerotic heart disease of native coronary artery without angina pectoris: Secondary | ICD-10-CM | POA: Insufficient documentation

## 2018-12-31 DIAGNOSIS — Z7289 Other problems related to lifestyle: Secondary | ICD-10-CM | POA: Diagnosis not present

## 2018-12-31 DIAGNOSIS — Z7982 Long term (current) use of aspirin: Secondary | ICD-10-CM | POA: Diagnosis not present

## 2018-12-31 DIAGNOSIS — Z87891 Personal history of nicotine dependence: Secondary | ICD-10-CM | POA: Diagnosis not present

## 2018-12-31 DIAGNOSIS — E876 Hypokalemia: Secondary | ICD-10-CM

## 2018-12-31 DIAGNOSIS — Z1159 Encounter for screening for other viral diseases: Secondary | ICD-10-CM | POA: Insufficient documentation

## 2018-12-31 DIAGNOSIS — D72829 Elevated white blood cell count, unspecified: Secondary | ICD-10-CM | POA: Diagnosis present

## 2018-12-31 DIAGNOSIS — Z79899 Other long term (current) drug therapy: Secondary | ICD-10-CM | POA: Insufficient documentation

## 2018-12-31 DIAGNOSIS — R7989 Other specified abnormal findings of blood chemistry: Secondary | ICD-10-CM | POA: Diagnosis present

## 2018-12-31 DIAGNOSIS — E039 Hypothyroidism, unspecified: Secondary | ICD-10-CM | POA: Insufficient documentation

## 2018-12-31 DIAGNOSIS — Z880 Allergy status to penicillin: Secondary | ICD-10-CM | POA: Insufficient documentation

## 2018-12-31 DIAGNOSIS — Z789 Other specified health status: Secondary | ICD-10-CM

## 2018-12-31 LAB — COMPREHENSIVE METABOLIC PANEL
ALT: 131 U/L — ABNORMAL HIGH (ref 0–44)
AST: 209 U/L — ABNORMAL HIGH (ref 15–41)
Albumin: 3.5 g/dL (ref 3.5–5.0)
Alkaline Phosphatase: 125 U/L (ref 38–126)
Anion gap: 14 (ref 5–15)
BUN: 26 mg/dL — ABNORMAL HIGH (ref 8–23)
CO2: 23 mmol/L (ref 22–32)
Calcium: 9.5 mg/dL (ref 8.9–10.3)
Chloride: 101 mmol/L (ref 98–111)
Creatinine, Ser: 0.98 mg/dL (ref 0.44–1.00)
GFR calc Af Amer: >60 mL/min (ref >60)
GFR calc non Af Amer: 55 mL/min — ABNORMAL LOW (ref >60)
Glucose, Bld: 152 mg/dL — ABNORMAL HIGH (ref 70–99)
Potassium: 3.4 mmol/L — ABNORMAL LOW (ref 3.5–5.1)
Sodium: 138 mmol/L (ref 135–145)
Total Bilirubin: 0.9 mg/dL (ref 0.3–1.2)
Total Protein: 6.1 g/dL — ABNORMAL LOW (ref 6.5–8.1)

## 2018-12-31 LAB — TROPONIN I: Troponin I: <0.03 ng/mL (ref <0.03)

## 2018-12-31 LAB — CBC
HCT: 35.9 % — ABNORMAL LOW (ref 36.0–46.0)
Hemoglobin: 11.4 g/dL — ABNORMAL LOW (ref 12.0–15.0)
MCH: 29.2 pg (ref 26.0–34.0)
MCHC: 31.8 g/dL (ref 30.0–36.0)
MCV: 92.1 fL (ref 80.0–100.0)
Platelets: 292 10*3/uL (ref 150–400)
RBC: 3.9 MIL/uL (ref 3.87–5.11)
RDW: 14.6 % (ref 11.5–15.5)
WBC: 16.2 10*3/uL — ABNORMAL HIGH (ref 4.0–10.5)
nRBC: 0 % (ref 0.0–0.2)

## 2018-12-31 LAB — LIPASE, BLOOD: Lipase: 22 U/L (ref 11–51)

## 2018-12-31 MED ORDER — POTASSIUM CHLORIDE CRYS ER 20 MEQ PO TBCR
30.0000 meq | EXTENDED_RELEASE_TABLET | Freq: Once | ORAL | Status: AC
  Administered 2019-01-01: 30 meq via ORAL
  Filled 2018-12-31: qty 2

## 2018-12-31 MED ORDER — LORAZEPAM 2 MG/ML IJ SOLN: 1.0000 mg | Freq: Four times a day (QID) | INTRAMUSCULAR | Status: DC | PRN

## 2018-12-31 MED ORDER — LACTATED RINGERS IV BOLUS
1000.0000 mL | Freq: Once | INTRAVENOUS | Status: AC
  Administered 2018-12-31: 1000 mL via INTRAVENOUS

## 2018-12-31 MED ORDER — ADULT MULTIVITAMIN W/MINERALS CH
1.0000 | ORAL_TABLET | Freq: Every day | ORAL | Status: DC
  Administered 2019-01-01 – 2019-01-02 (×2): 1 via ORAL
  Filled 2018-12-31 (×2): qty 1

## 2018-12-31 MED ORDER — FOLIC ACID 1 MG PO TABS
1.0000 mg | ORAL_TABLET | Freq: Every day | ORAL | Status: DC
  Administered 2019-01-01 – 2019-01-02 (×2): 1 mg via ORAL
  Filled 2018-12-31 (×2): qty 1

## 2018-12-31 MED ORDER — LORAZEPAM 2 MG/ML IJ SOLN: 0.0000 mg | Freq: Two times a day (BID) | INTRAMUSCULAR | Status: DC

## 2018-12-31 MED ORDER — THIAMINE HCL 100 MG/ML IJ SOLN
100.0000 mg | Freq: Every day | INTRAMUSCULAR | Status: DC
  Filled 2018-12-31 (×2): qty 2

## 2018-12-31 MED ORDER — LORAZEPAM 1 MG PO TABS: 1.0000 mg | ORAL_TABLET | Freq: Four times a day (QID) | ORAL | Status: DC | PRN

## 2018-12-31 MED ORDER — VITAMIN B-1 100 MG PO TABS
100.0000 mg | ORAL_TABLET | Freq: Every day | ORAL | Status: DC
  Administered 2019-01-01 – 2019-01-02 (×2): 100 mg via ORAL
  Filled 2018-12-31 (×2): qty 1

## 2018-12-31 MED ORDER — ASPIRIN 81 MG PO CHEW: 324.0000 mg | CHEWABLE_TABLET | Freq: Once | ORAL | Status: DC

## 2018-12-31 MED ORDER — LORAZEPAM 2 MG/ML IJ SOLN: 0.0000 mg | Freq: Four times a day (QID) | INTRAMUSCULAR | Status: DC

## 2018-12-31 NOTE — ED Triage Notes (Signed)
BIB EMS from home. Pt reports onset of CP earlier this evening. Central with radiation into back. Tight in nature. Denies CP at the present time.

## 2018-12-31 NOTE — H&P (Addendum)
History and Physical    Emma [name] CVE:[id number] DOB: 1940/10/15 DOA: 12/31/2018  Referring MD/NP/PA:   PCP: Shon Baton, MD   Patient coming from:  The patient is coming from home.  At baseline, pt is independent for most of ADL.        Chief Complaint: chest pain  HPI: Emma [name] is a 78 y.o. female with medical history significant of hypertension, COPD, hypothyroidism, who presents with CP.   Pt states that her chest pain started in the evening, which is located in the center chest, initially 8 out of 10 in severity, currently chest pain-free.  Her chest pain is pressure-like, radiating to the mid back.  Patient does not have shortness of breath, fever or chills.  She has mild dry cough.  Patient denies nausea, vomiting, diarrhea, abdominal pain, symptoms of UTI. Patient states that she had one episode of lightheadedness, which has resolved.  Currently no unilateral weakness, numbness or tingling in her extremities. No facial droop or slurred speech. Pt states that she is taking methotrexate weekly for rash which was not clear about the etiology.  ED Course: pt was found to have negative troponin, WBC 16.2, pending COVID-19 test, abnormal liver function (ALP 125, AST 209, ALT 131, total bilirubin 0.9), potassium 3.4, renal function okay, temperature normal, heart rate in 90s, RR 16, oxygen saturation 99% on room air, chest x-ray showed a bilateral atelectasis.  Patient is placed on telemetry bed for observation.  Review of Systems:   General: no fevers, chills, no body weight gain, has fatigue HEENT: no blurry vision, hearing changes or sore throat Respiratory: no dyspnea, has dry coughing, no wheezing CV: has chest pain, no palpitations GI: no nausea, vomiting, abdominal pain, diarrhea, constipation GU: no dysuria, burning on urination, increased urinary frequency, hematuria  Ext: no leg edema Neuro: no unilateral weakness, numbness, or tingling, no vision change or hearing  loss Skin: no rash, no skin tear. MSK: No muscle spasm, no deformity, no limitation of range of movement in spin Heme: No easy bruising.  Travel history: No recent long distant travel.  Allergy:  Allergies  Allergen Reactions  . Penicillins Hives, Rash and Other (See Comments)    PATIENT HAS HAD A PCN REACTION WITH IMMEDIATE RASH, FACIAL/TONGUE/THROAT SWELLING, SOB, OR LIGHTHEADEDNESS WITH HYPOTENSION:  #  #  #  YES  #  #  #   Has patient had a PCN reaction causing severe rash involving mucus membranes or skin necrosis: no Has patient had a PCN reaction that required hospitalization: no Has patient had a PCN reaction occurring within the last 10 years: #  #  #  YES  #  #  #    . Moxifloxacin     UNSPECIFIED REACTION     Past Medical History:  Diagnosis Date  . Allergy   . Asthma    as child  . 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 pt. unsure of pathology  . Carpal tunnel syndrome   . Cataract   . Condyloma   . Constipation   . Hx of adenomatous colonic polyps   . Hypertension   . Hypothyroidism    2016- synthroid d/c'd   . Osteoarthritis    OA- knee- L knee, R thumb  . Osteopenia   . Thyroid disease   . Vitamin D deficiency     Past Surgical History:  Procedure Laterality Date  .  BELPHAROPTOSIS REPAIR Bilateral 04/2015   vision correction  . BREAST BIOPSY     right   . CATARACT EXTRACTION, BILATERAL Bilateral 01/2014, 02/2014   /w IOL  . CESAREAN SECTION     x 2   . CHOLECYSTECTOMY    . EYE SURGERY    . GALLBLADDER SURGERY    . LAPAROSCOPIC APPENDECTOMY N/A 05/30/2018   Procedure: APPENDECTOMY LAPAROSCOPIC;  Surgeon: Leighton Ruff, MD;  Location: [facility];  Service: General;  Laterality: N/A;  . SHOULDER SURGERY  2006   right   . SHOULDER SURGERY Left 07/17/2013    bone spurs, and partial tear of rotator cuff  . TOTAL KNEE ARTHROPLASTY Left 08/14/2017   Procedure: LEFT TOTAL KNEE ARTHROPLASTY;   Surgeon: Vickey Huger, MD;  Location: [facility];  Service: Orthopedics;  Laterality: Left;  . TUBAL LIGATION      Social History:  reports that she quit smoking about 16 months ago. Her smoking use included cigarettes. She has a 12.50 pack-year smoking history. She has never used smokeless tobacco. She reports current alcohol use of about 7.0 standard drinks of alcohol per week. She reports that she does not use drugs.  Family History:  Family History  Problem Relation Age of Onset  . Heart disease Father   . Diabetes Paternal Grandfather   . Asthma Son   . Colon cancer Neg Hx      Prior to Admission medications   Medication Sig Start Date End Date Taking? Authorizing Provider  acetaminophen (TYLENOL) 500 MG tablet Take 1,000 mg by mouth every 6 (six) hours as needed for mild pain or moderate pain.    [provider]  ADVAIR DISKUS 250-50 MCG/DOSE AEPB Inhale 1 puff into the lungs daily.  11/13/10   [provider]  cholecalciferol (VITAMIN D) 1000 units tablet Take 1,000 Units by mouth daily.    [provider]  folic acid (FOLVITE) 1 MG tablet Take 1 mg by mouth daily.    [provider]  gabapentin (NEURONTIN) 300 MG capsule Take 300 mg by mouth at bedtime.  10/27/10   [provider]  ibuprofen (ADVIL,MOTRIN) 200 MG tablet Take 200 mg by mouth daily as needed for fever.    [provider]  KLOR-CON M20 20 MEQ tablet Take 20 mEq by mouth 2 (two) times daily.  12/01/10   [provider]  losartan-hydrochlorothiazide (HYZAAR) 100-12.5 MG tablet Take 1 tablet by mouth daily. 12/05/17   [provider]  methotrexate (RHEUMATREX) 2.5 MG tablet Take 2.5-5 mg by mouth once a week. Sunday 12/01/10   [provider]  montelukast (SINGULAIR) 10 MG tablet Take 10 mg by mouth at bedtime.    [provider]  Propylene Glycol (SYSTANE BALANCE OP) Place 1 drop into both eyes daily.    [provider]  Vitamin D,  Ergocalciferol, (DRISDOL) 50000 UNITS CAPS Take 50,000 Units by mouth every 7 (seven) days. Sunday 10/08/10   [provider]    Physical Exam: Vitals:   12/31/18 2315 01/01/19 0021 01/01/19 0054 01/01/19 0339  BP: (!) 109/93  (!) 152/64 (!) 133/50  Pulse: (!) 102 (!) 101 (!) 103 95  Resp: (!) 28  18 18   Temp:   97.9 F (36.6 C) 98.4 F (36.9 C)  TempSrc:   Oral Oral  SpO2: 99%  100% 98%  Weight:   64 kg   Height:       General: Not in acute distress HEENT:  Eyes: PERRL, EOMI, no scleral icterus.       ENT: No discharge from the ears and nose, no pharynx injection, no tonsillar enlargement.        Neck: No JVD, no bruit, no mass felt. Heme: No neck lymph node enlargement. Cardiac: S1/S2, RRR, No murmurs, No gallops or rubs. Respiratory: No rales, wheezing, rhonchi or rubs. GI: Soft, nondistended, nontender, no rebound pain, no organomegaly, BS present. GU: No hematuria Ext: No pitting leg edema bilaterally. 2+DP/PT pulse bilaterally. Musculoskeletal: No joint deformities, No joint redness or warmth, no limitation of ROM in spin. Skin: No rashes.  Neuro: Alert, oriented X3, cranial nerves II-XII grossly intact, moves all extremities normally. Psych: Patient is not psychotic, no suicidal or hemocidal ideation.  Labs on Admission: I have personally reviewed following labs and imaging studies  CBC: Recent Labs  Lab 12/31/18 2142  WBC 16.2*  HGB 11.4*  HCT 35.9*  MCV 92.1  PLT 093   Basic Metabolic Panel: Recent Labs  Lab 12/31/18 2142  NA 138  K 3.4*  CL 101  CO2 23  GLUCOSE 152*  BUN 26*  CREATININE 0.98  CALCIUM 9.5   GFR: Estimated Creatinine Clearance: 40.6 mL/min (by C-G formula based on SCr of 0.98 mg/dL). Liver Function Tests: Recent Labs  Lab 12/31/18 2142  AST 209*  ALT 131*  ALKPHOS 125  BILITOT 0.9  PROT 6.1*  ALBUMIN 3.5   Recent Labs  Lab 12/31/18 2142  LIPASE 22   No results for input(s): AMMONIA in the last 168 hours.  Coagulation Profile: No results for input(s): INR, PROTIME in the last 168 hours. Cardiac Enzymes: Recent Labs  Lab 12/31/18 2142 01/01/19 0010  TROPONINI <0.03 <0.03   BNP (last 3 results) No results for input(s): PROBNP in the last 8760 hours. HbA1C: No results for input(s): HGBA1C in the last 72 hours. CBG: No results for input(s): GLUCAP in the last 168 hours. Lipid Profile: No results for input(s): CHOL, HDL, LDLCALC, TRIG, CHOLHDL, LDLDIRECT in the last 72 hours. Thyroid Function Tests: No results for input(s): TSH, T4TOTAL, FREET4, T3FREE, THYROIDAB in the last 72 hours. Anemia Panel: No results for input(s): VITAMINB12, FOLATE, FERRITIN, TIBC, IRON, RETICCTPCT in the last 72 hours. Urine analysis:    Component Value Date/Time   BILIRUBINUR neg 12/25/2017 1546   PROTEINUR Negative 12/25/2017 1546   UROBILINOGEN negative (A) 12/25/2017 1546   NITRITE ++ 12/25/2017 1546   LEUKOCYTESUR Large (3+) (A) 12/25/2017 1546   Sepsis Labs: @LABRCNTIP (procalcitonin:4,lacticidven:4) )No results found for this or any previous visit (from the past 240 hour(s)).   Radiological Exams on Admission: Dg Chest 2 View  Result Date: 12/31/2018 CLINICAL DATA:  Central chest pain. EXAM: CHEST - 2 VIEW COMPARISON:  None. FINDINGS: Mild elevation of right hemidiaphragm.The cardiomediastinal contours are normal. Streaky bibasilar atelectasis. Pulmonary vasculature is normal. No consolidation, pleural effusion, or pneumothorax. No acute osseous abnormalities are seen. IMPRESSION: 1. Streaky bibasilar atelectasis. 2. Lungs otherwise clear. Electronically Signed   By: Keith Rake M.D.   On: 12/31/2018 22:10     EKG: Independently reviewed.  Sinus rhythm, QTC 447, LAD.  Nonspecific T wave change.  Assessment/Plan Principal Problem:   Chest pain Active Problems:   COPD (chronic obstructive pulmonary disease) (HCC)   Hypokalemia   Leukocytosis   Abnormal LFTs   HTN (hypertension)    Alcohol use   Chest pain: Initial troponin negative.  EKG showed no acute ischemic change.  - will place on Tele bed for  obs - cycle CE q6 x3 and repeat EKG in the am  - prn Nitroglycerin, Morphine, and aspirin - Risk factor stratification: will check FLP and A1C ,UDS - inpt card consult was requested via Epic  COPD (chronic obstructive pulmonary disease) ([place]): stable -Continue bronchodilators and Singulair  Hypokalemia: K= 3.4 on admission. - Repleted  Leukocytosis: WBC 16.2. No source of infection identified.  Patient does not have fever.  Likely due to reactive response. -Follow-up by CBC   Abnormal LFTs: ALP 125, AST 209, ALT 131, total bilirubin 0.9.  Etiology is not clear.  The ratio of AST 209/ALT 131 is consistent with alcohol use. -Avoid using liver toxic medications, such as tylenol -Check hepatitis panel  HTN:  -Continue home medications: hyzarr -IV hydralazine prn  Alcohol use: pt states that she drinks beer every day, mostly 1 beer each time.  Currently no signs of withdrawal. -Start CIWA protocol - Did counseling about importance of cutting down drinking.   DVT ppx: SQ Lovenox Code Status: Full code Family Communication: None at bed side. Disposition Plan:  Anticipate discharge back to previous home environment Consults called:  none Admission status: Obs / tele    Date of Service 01/01/2019    [facility]   If 7PM-7AM, please contact night-coverage [email or web address] Password [facility] 01/01/2019, 4:55 AM

## 2018-12-31 NOTE — ED Provider Notes (Signed)
[facility] EMERGENCY DEPARTMENT Provider Note   CSN: [id number] Arrival date & time: 12/31/18  2049    History   Chief Complaint Chief Complaint  Patient presents with  . Chest Pain    HPI Emma [name] is a 78 y.o. female.  HPI: A 78 year old patient with a history of hypertension presents for evaluation of chest pain. Initial onset of pain was less than one hour ago. The patient's chest pain is well-localized, is sharp, is not worse with exertion and is relieved by nitroglycerin. The patient reports some diaphoresis. The patient's chest pain is middle- or left-sided, is not described as heaviness/pressure/tightness and does not radiate to the arms/jaw/neck. The patient does not complain of nausea. The patient has a family history of coronary artery disease in a first-degree relative with onset less than age 71. The patient has no history of stroke, has no history of peripheral artery disease, has not smoked in the past 90 days, denies any history of treated diabetes, has no history of hypercholesterolemia and does not have an elevated BMI (>=30).    Chest Pain Pain location:  Substernal area and L chest Pain quality: aching and dull   Pain radiates to:  Mid back Pain severity:  Moderate Onset quality:  Sudden Duration:  1 hour Progression:  Resolved Chronicity:  New Worsened by:  Nothing Ineffective treatments:  None tried Associated symptoms: fatigue and weakness   Associated symptoms: no fever     Past Medical History:  Diagnosis Date  . Allergy   . Asthma    as child  . 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 pt. unsure of pathology  . Carpal tunnel syndrome   . Cataract   . Condyloma   . Constipation   . Hx of adenomatous colonic polyps   . Hypertension   . Hypothyroidism    2016- synthroid d/c'd   . Osteoarthritis    OA- knee- L knee, R thumb  . Osteopenia   . Thyroid  disease   . Vitamin D deficiency     Patient Active Problem List   Diagnosis Date Noted  . Hypokalemia 12/31/2018  . Leukocytosis 12/31/2018  . Abnormal LFTs 12/31/2018  . HTN (hypertension) 12/31/2018  . Chest pain 12/31/2018  . Alcohol use 12/31/2018  . Gangrenous appendicitis 05/31/2018  . Acute gangrenous appendicitis s/p lap appendectomy 05/30/2018 05/30/2018  . 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 Acute bronchitis 04/02/2013  . COPD (chronic obstructive pulmonary disease) ([place]) 04/02/2013    Past Surgical History:  Procedure Laterality Date  . BELPHAROPTOSIS REPAIR Bilateral 04/2015   vision correction  . BREAST BIOPSY     right   . CATARACT EXTRACTION, BILATERAL Bilateral 01/2014, 02/2014   /w IOL  . CESAREAN SECTION     x 2   . CHOLECYSTECTOMY    . EYE SURGERY    . GALLBLADDER SURGERY    . LAPAROSCOPIC APPENDECTOMY N/A 05/30/2018   Procedure: APPENDECTOMY LAPAROSCOPIC;  Surgeon: Leighton Ruff, MD;  Location: [facility];  Service: General;  Laterality: N/A;  . SHOULDER SURGERY  2006   right   . SHOULDER SURGERY Left 07/17/2013    bone spurs, and partial tear of rotator cuff  . TOTAL KNEE ARTHROPLASTY Left 08/14/2017   Procedure: LEFT TOTAL KNEE ARTHROPLASTY;  Surgeon: Vickey Huger, MD;  Location: [place];  Service: Orthopedics;  Laterality: Left;  . TUBAL LIGATION  OB History    Gravida  2   Para  2   Term  2   Preterm      AB      Living  2     SAB      TAB      Ectopic      Multiple      Live Births               Home Medications    Prior to Admission medications   Medication Sig Start Date End Date Taking? Authorizing Provider  acetaminophen (TYLENOL) 500 MG tablet Take 1,000 mg by mouth every 6 (six) hours as needed for mild pain or moderate pain.    [provider]  ADVAIR DISKUS 250-50 MCG/DOSE AEPB Inhale 1 puff into the lungs daily.  11/13/10   [provider]  cholecalciferol (VITAMIN D) 1000 units tablet  Take 1,000 Units by mouth daily.    [provider]  folic acid (FOLVITE) 1 MG tablet Take 1 mg by mouth daily.    [provider]  gabapentin (NEURONTIN) 300 MG capsule Take 300 mg by mouth at bedtime.  10/27/10   [provider]  ibuprofen (ADVIL,MOTRIN) 200 MG tablet Take 200 mg by mouth daily as needed for fever.    [provider]  KLOR-CON M20 20 MEQ tablet Take 20 mEq by mouth 2 (two) times daily.  12/01/10   [provider]  losartan-hydrochlorothiazide (HYZAAR) 100-12.5 MG tablet Take 1 tablet by mouth daily. 12/05/17   [provider]  methotrexate (RHEUMATREX) 2.5 MG tablet Take 2.5-5 mg by mouth once a week. Sunday 12/01/10   [provider]  montelukast (SINGULAIR) 10 MG tablet Take 10 mg by mouth at bedtime.    [provider]  Propylene Glycol (SYSTANE BALANCE OP) Place 1 drop into both eyes daily.    [provider]  Vitamin D, Ergocalciferol, (DRISDOL) 50000 UNITS CAPS Take 50,000 Units by mouth every 7 (seven) days. Sunday 10/08/10   [provider]    Family History Family History  Problem Relation Age of Onset  . Heart disease Father   . Diabetes Paternal Grandfather   . Asthma Son   . Colon cancer Neg Hx     Social History Social History   Tobacco Use  . Smoking status: Former Smoker    Packs/day: 0.25    Years: 50.00    Pack years: 12.50    Types: Cigarettes    Quit date: 08/11/2017    Years since quitting: 1.3  . Smokeless tobacco: Never Used  Substance Use Topics  . Alcohol use: Yes    Alcohol/week: 7.0 standard drinks    Types: 7 Standard drinks or equivalent per week  . Drug use: No     Allergies   Penicillins and Moxifloxacin   Review of Systems Review of Systems  Constitutional: Positive for fatigue. Negative for fever.  Cardiovascular: Positive for chest pain.  Neurological: Positive for weakness.  All other systems reviewed and are negative.     Physical Exam Updated Vital Signs BP (!) 109/93   Pulse (!) 102   Temp 97.9 F (36.6 C) (Oral)   Resp (!) 28   Ht 5\' 1"  (1.549 m)   Wt 70.3 kg   LMP 12/09/1997 (Approximate)   SpO2 99%   BMI 29.29 kg/m   Physical Exam Vitals signs and nursing note reviewed.  Constitutional:      Appearance: She is well-developed.  HENT:     Head: Normocephalic and atraumatic.  Neck:     Musculoskeletal: Normal range of motion.  Cardiovascular:     Rate and Rhythm: Normal rate and regular rhythm.  Pulmonary:     Effort: No respiratory distress.     Breath sounds: Normal breath sounds. No stridor. No decreased breath sounds.  Abdominal:     General: There is no distension.     Palpations: Abdomen is soft.  Musculoskeletal: Normal range of motion.     Right lower leg: No edema.     Left lower leg: No edema.  Skin:    General: Skin is warm and dry.  Neurological:     Mental Status: She is alert.      ED Treatments / Results  Labs (all labs ordered are listed, but only abnormal results are displayed) Labs Reviewed  COMPREHENSIVE METABOLIC PANEL - Abnormal; Notable for the following components:      Result Value   Potassium 3.4 (*)    Glucose, Bld 152 (*)    BUN 26 (*)    Total Protein 6.1 (*)    AST 209 (*)    ALT 131 (*)    GFR calc non Af Amer 55 (*)    All other components within normal limits  CBC - Abnormal; Notable for the following components:   WBC 16.2 (*)    Hemoglobin 11.4 (*)    HCT 35.9 (*)    All other components within normal limits  NOVEL CORONAVIRUS, NAA (HOSPITAL ORDER, SEND-OUT TO REF LAB)  TROPONIN I  LIPASE, BLOOD  TROPONIN I  TROPONIN I  TROPONIN I  CBG MONITORING, ED    EKG EKG Interpretation  Date/Time:  Monday December 31 2018 21:35:09 EDT Ventricular Rate:  90 PR Interval:    QRS Duration: 97 QT Interval:  365 QTC Calculation: 447 R Axis:   -5 Text Interpretation:  Sinus rhythm Abnormal R-wave progression, early transition Artifact in  lead(s) I II III aVR aVL aVF given artifact, no significant change since 05/30/2018 Confirmed by Merrily Pew [phone]) on 12/31/2018 10:27:04 PM   Radiology Dg Chest 2 View  Result Date: 12/31/2018 CLINICAL DATA:  Central chest pain. EXAM: CHEST - 2 VIEW COMPARISON:  None. FINDINGS: Mild elevation of right hemidiaphragm.The cardiomediastinal contours are normal. Streaky bibasilar atelectasis. Pulmonary vasculature is normal. No consolidation, pleural effusion, or pneumothorax. No acute osseous abnormalities are seen. IMPRESSION: 1. Streaky bibasilar atelectasis. 2. Lungs otherwise clear. Electronically Signed   By: Keith Rake M.D.   On: 12/31/2018 22:10    Procedures Procedures (including critical care time)  Medications Ordered in ED Medications  aspirin chewable tablet 324 mg (324 mg Oral Not Given 12/31/18 2215)  LORazepam (ATIVAN) tablet 1 mg (has no administration in time range)    Or  LORazepam (ATIVAN) injection 1 mg (has no administration in time range)  thiamine (VITAMIN B-1) tablet 100 mg (has no administration in time range)    Or  thiamine (B-1) injection 100 mg (has no administration in time range)  folic acid (FOLVITE) tablet 1 mg (has no administration in time range)  multivitamin with minerals tablet 1 tablet (has no administration in time range)  LORazepam (ATIVAN) injection 0-4 mg (has no administration in time range)    Followed by  LORazepam (ATIVAN) injection 0-4 mg (has no administration in time range)  potassium chloride SA (K-DUR) CR tablet 30 mEq (has no administration in time range)  lactated ringers bolus 1,000 mL (1,000  mLs Intravenous New Bag/Given 12/31/18 2318)     Initial Impression / Assessment and Plan / ED Course  I have reviewed the triage vital signs and the nursing notes.  Pertinent labs & imaging results that were available during my care of the patient were reviewed by me and considered in my medical decision making (see chart for details).   HEAR Score: 4  HEAR score of 4, will obs for second troponin, further workup.   Final Clinical Impressions(s) / ED Diagnoses   Final diagnoses:  Nonspecific chest pain    ED Discharge Orders    None       Evin Chirco, Corene Cornea, MD 12/31/18 2354

## 2019-01-01 ENCOUNTER — Observation Stay (HOSPITAL_COMMUNITY): Payer: Medicare Other

## 2019-01-01 ENCOUNTER — Other Ambulatory Visit: Payer: Self-pay

## 2019-01-01 ENCOUNTER — Other Ambulatory Visit: Payer: Self-pay | Admitting: Physician Assistant

## 2019-01-01 ENCOUNTER — Encounter (HOSPITAL_COMMUNITY): Payer: Self-pay | Admitting: Surgery

## 2019-01-01 DIAGNOSIS — J449 Chronic obstructive pulmonary disease, unspecified: Secondary | ICD-10-CM | POA: Diagnosis not present

## 2019-01-01 DIAGNOSIS — R079 Chest pain, unspecified: Secondary | ICD-10-CM

## 2019-01-01 DIAGNOSIS — Z7289 Other problems related to lifestyle: Secondary | ICD-10-CM | POA: Diagnosis not present

## 2019-01-01 DIAGNOSIS — I10 Essential (primary) hypertension: Secondary | ICD-10-CM | POA: Diagnosis not present

## 2019-01-01 DIAGNOSIS — Z1159 Encounter for screening for other viral diseases: Secondary | ICD-10-CM | POA: Diagnosis not present

## 2019-01-01 DIAGNOSIS — I2510 Atherosclerotic heart disease of native coronary artery without angina pectoris: Secondary | ICD-10-CM | POA: Diagnosis not present

## 2019-01-01 DIAGNOSIS — R945 Abnormal results of liver function studies: Secondary | ICD-10-CM | POA: Diagnosis not present

## 2019-01-01 HISTORY — PX: IR RADIOLOGY PERIPHERAL GUIDED IV START: IMG5598

## 2019-01-01 HISTORY — PX: IR US GUIDE VASC ACCESS RIGHT: IMG2390

## 2019-01-01 LAB — TROPONIN I
Troponin I: <0.03 ng/mL (ref <0.03)
Troponin I: <0.03 ng/mL (ref <0.03)
Troponin I: <0.03 ng/mL (ref <0.03)

## 2019-01-01 LAB — LIPID PANEL
Cholesterol: 165 mg/dL (ref 0–200)
HDL: 85 mg/dL (ref >40)
LDL Cholesterol: 76 mg/dL (ref 0–99)
Total CHOL/HDL Ratio: 1.9 RATIO
Triglycerides: 18 mg/dL (ref <150)
VLDL: 4 mg/dL (ref 0–40)

## 2019-01-01 LAB — RAPID URINE DRUG SCREEN, HOSP PERFORMED
Amphetamines: NOT DETECTED
Barbiturates: NOT DETECTED
Benzodiazepines: NOT DETECTED
Cocaine: NOT DETECTED
Opiates: NOT DETECTED
Tetrahydrocannabinol: NOT DETECTED

## 2019-01-01 LAB — HIV ANTIBODY (ROUTINE TESTING W REFLEX): HIV Screen 4th Generation wRfx: NONREACTIVE

## 2019-01-01 LAB — HEMOGLOBIN A1C
Hgb A1c MFr Bld: 5.3 % (ref 4.8–5.6)
Mean Plasma Glucose: 105.41 mg/dL

## 2019-01-01 MED ORDER — LOSARTAN POTASSIUM-HCTZ 100-12.5 MG PO TABS: 1.0000 | ORAL_TABLET | Freq: Every day | ORAL | Status: DC

## 2019-01-01 MED ORDER — ALBUTEROL SULFATE (2.5 MG/3ML) 0.083% IN NEBU: 2.5000 mg | INHALATION_SOLUTION | RESPIRATORY_TRACT | Status: DC | PRN

## 2019-01-01 MED ORDER — HYDRALAZINE HCL 20 MG/ML IJ SOLN: 5.0000 mg | INTRAMUSCULAR | Status: DC | PRN

## 2019-01-01 MED ORDER — LIDOCAINE HCL 1 % IJ SOLN
INTRAMUSCULAR | Status: AC
  Filled 2019-01-01: qty 20

## 2019-01-01 MED ORDER — GABAPENTIN 300 MG PO CAPS
300.0000 mg | ORAL_CAPSULE | Freq: Every day | ORAL | Status: DC
  Administered 2019-01-01: 300 mg via ORAL
  Filled 2019-01-01 (×2): qty 1

## 2019-01-01 MED ORDER — NITROGLYCERIN 0.4 MG SL SUBL
SUBLINGUAL_TABLET | SUBLINGUAL | Status: AC
  Administered 2019-01-01: 16:00:00 0.8 mg via SUBLINGUAL
  Filled 2019-01-01: qty 2

## 2019-01-01 MED ORDER — ONDANSETRON HCL 4 MG/2ML IJ SOLN: 4.0000 mg | Freq: Four times a day (QID) | INTRAMUSCULAR | Status: DC | PRN

## 2019-01-01 MED ORDER — ENOXAPARIN SODIUM 40 MG/0.4ML ~~LOC~~ SOLN
40.0000 mg | Freq: Every day | SUBCUTANEOUS | Status: DC
  Administered 2019-01-01 – 2019-01-02 (×2): 40 mg via SUBCUTANEOUS
  Filled 2019-01-01 (×2): qty 0.4

## 2019-01-01 MED ORDER — POLYVINYL ALCOHOL 1.4 % OP SOLN
1.0000 [drp] | Freq: Every day | OPHTHALMIC | Status: DC
  Administered 2019-01-01 – 2019-01-02 (×2): 1 [drp] via OPHTHALMIC
  Filled 2019-01-01 (×2): qty 15

## 2019-01-01 MED ORDER — NITROGLYCERIN 0.4 MG SL SUBL: 0.4000 mg | SUBLINGUAL_TABLET | SUBLINGUAL | Status: DC | PRN

## 2019-01-01 MED ORDER — HYDROCHLOROTHIAZIDE 12.5 MG PO CAPS
12.5000 mg | ORAL_CAPSULE | Freq: Every day | ORAL | Status: DC
  Administered 2019-01-01 – 2019-01-02 (×2): 12.5 mg via ORAL
  Filled 2019-01-01 (×2): qty 1

## 2019-01-01 MED ORDER — NITROGLYCERIN 0.4 MG SL SUBL
0.8000 mg | SUBLINGUAL_TABLET | Freq: Once | SUBLINGUAL | Status: AC
  Administered 2019-01-01: 0.8 mg via SUBLINGUAL

## 2019-01-01 MED ORDER — METHOTREXATE 2.5 MG PO TABS: 5.0000 mg | ORAL_TABLET | ORAL | Status: DC

## 2019-01-01 MED ORDER — ASPIRIN EC 325 MG PO TBEC
325.0000 mg | DELAYED_RELEASE_TABLET | Freq: Every day | ORAL | Status: DC
  Administered 2019-01-01 – 2019-01-02 (×2): 325 mg via ORAL
  Filled 2019-01-01 (×2): qty 1

## 2019-01-01 MED ORDER — MOMETASONE FURO-FORMOTEROL FUM 200-5 MCG/ACT IN AERO
2.0000 | INHALATION_SPRAY | Freq: Two times a day (BID) | RESPIRATORY_TRACT | Status: DC
  Administered 2019-01-01 – 2019-01-02 (×3): 2 via RESPIRATORY_TRACT
  Filled 2019-01-01 (×2): qty 8.8

## 2019-01-01 MED ORDER — LOSARTAN POTASSIUM 50 MG PO TABS
100.0000 mg | ORAL_TABLET | Freq: Every day | ORAL | Status: DC
  Administered 2019-01-01 – 2019-01-02 (×2): 100 mg via ORAL
  Filled 2019-01-01 (×2): qty 2

## 2019-01-01 MED ORDER — IOHEXOL 350 MG/ML SOLN
100.0000 mL | Freq: Once | INTRAVENOUS | Status: AC | PRN
  Administered 2019-01-01: 100 mL via INTRAVENOUS

## 2019-01-01 MED ORDER — METOPROLOL TARTRATE 50 MG PO TABS
50.0000 mg | ORAL_TABLET | Freq: Once | ORAL | Status: AC
  Administered 2019-01-01: 50 mg via ORAL
  Filled 2019-01-01: qty 1

## 2019-01-01 MED ORDER — MONTELUKAST SODIUM 10 MG PO TABS
10.0000 mg | ORAL_TABLET | Freq: Every day | ORAL | Status: DC
  Filled 2019-01-01 (×2): qty 1

## 2019-01-01 MED ORDER — VITAMIN D 25 MCG (1000 UNIT) PO TABS
1000.0000 [IU] | ORAL_TABLET | Freq: Every day | ORAL | Status: DC
  Administered 2019-01-01 – 2019-01-02 (×2): 1000 [IU] via ORAL
  Filled 2019-01-01 (×2): qty 1

## 2019-01-01 MED ORDER — MORPHINE SULFATE (PF) 2 MG/ML IV SOLN: 2.0000 mg | INTRAVENOUS | Status: DC | PRN

## 2019-01-01 MED ORDER — METOPROLOL TARTRATE 5 MG/5ML IV SOLN
10.0000 mg | INTRAVENOUS | Status: AC | PRN
  Administered 2019-01-01: 10 mg via INTRAVENOUS
  Filled 2019-01-01: qty 10

## 2019-01-01 MED ORDER — FOLIC ACID 1 MG PO TABS: 1.0000 mg | ORAL_TABLET | Freq: Every day | ORAL | Status: DC

## 2019-01-01 NOTE — Progress Notes (Signed)
[facility] PROGRESS NOTE  Emma [name] MLY:[id number] DOB: Jun 27, 1941 DOA: 12/31/2018 PCP: Shon Baton, MD  Assessment/Plan:  Chest pain: Troponin negative c3/  EKG showed no acute ischemic change. No events on tele. Evaluated by cards who opine low suspicion for ACS. Recommend CTA. Pain resolved with NTG and asa. Lipid panel  Within limits of normal.   COPD (chronic obstructive pulmonary disease) ([place]): stable. Former smoker. Oxygen saturation level greater than 90% -Continue bronchodilators and Singulair  Hypokalemia: K= 3.4 on admission. - Repleted -recheck  Leukocytosis: WBC 16.2. No source of infection identified.  Patient does not have fever.  Likely due to reactive response. -Follow-up by CBC   Abnormal LFTs: ALP 125, AST 209, ALT 131, total bilirubin 0.9.  Etiology is not clear.  The ratio of AST 209/ALT 131 is consistent with alcohol use. 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  Currently no signs of withdrawal. -Start CIWA protocol - Did counseling about importance of cutting down drinking.   Code Status: full Family Communication: paitent Disposition Plan: home hopefully today   Consultants:  Gwenlyn Found cards  Procedures:  CTA  Antibiotics:   HPI/Subjective: 78 yo hx htn admitted cp rule out. Cards recommending CTA. Work up encouraging so far  Objective: Vitals:   01/01/19 1113 01/01/19 1218  BP: (!) 143/58 (!) 143/53  Pulse: 66 (!) 59  Resp: 16 20  Temp: 98.1 F (36.7 C)   SpO2: 98% 98%    Intake/Output Summary (Last 24 hours) at 01/01/2019 1415 Last data filed at 01/01/2019 0500 Gross per 24 hour  Intake 222 ml  Output -  Net 222 ml   Filed Weights   12/31/18 2106 01/01/19 0054  Weight: 70.3 kg 64 kg     Exam:   General:  Awake alert sitting side of bed  Cardiovascular: rrr no mgr no LE edema  Respiratory: normal effort BS clear bilaterally no wheeze  Abdomen: obese soft +BS no guarding or rebounding  Musculoskeletal: joints without swelling/erythema   Data Reviewed: Basic Metabolic Panel: Recent Labs  Lab 12/31/18 2142  NA 138  K 3.4*  CL 101  CO2 23  GLUCOSE 152*  BUN 26*  CREATININE 0.98  CALCIUM 9.5   Liver Function Tests: Recent Labs  Lab 12/31/18 2142  AST 209*  ALT 131*  ALKPHOS 125  BILITOT 0.9  PROT 6.1*  ALBUMIN 3.5   Recent Labs  Lab 12/31/18 2142  LIPASE 22   No results for input(s): AMMONIA in the last 168 hours. CBC: Recent Labs  Lab 12/31/18 2142  WBC 16.2*  HGB 11.4*  HCT 35.9*  MCV 92.1  PLT 292   Cardiac Enzymes: Recent Labs  Lab 12/31/18 2142 01/01/19 0010 01/01/19 0457 01/01/19 1112  TROPONINI <0.03 <0.03 <0.03 <0.03   BNP (last 3 results) No results for input(s): BNP in the last 8760 hours.  ProBNP (last 3 results) No results for input(s): PROBNP in the last 8760 hours.  CBG: No results for input(s): GLUCAP in the last 168 hours.  No results found for this or any previous visit (from the past 240 hour(s)).   Studies: Dg Chest 2 View  Result Date: 12/31/2018 CLINICAL DATA:  Central chest pain. EXAM: CHEST - 2 VIEW COMPARISON:  None. FINDINGS: Mild elevation of right  hemidiaphragm.The cardiomediastinal contours are normal. Streaky bibasilar atelectasis. Pulmonary vasculature is normal. No consolidation, pleural effusion, or pneumothorax. No acute osseous abnormalities are seen. IMPRESSION: 1. Streaky bibasilar atelectasis. 2. Lungs otherwise clear. Electronically Signed   By: Keith Rake M.D.   On: 12/31/2018 22:10    Scheduled Meds: . aspirin  324 mg Oral Once  . aspirin EC  325 mg Oral Daily  . cholecalciferol  1,000 Units Oral Daily  . enoxaparin (LOVENOX) injection  40 mg Subcutaneous Daily  . folic  acid  1 mg Oral Daily  . gabapentin  300 mg Oral QHS  . hydrochlorothiazide  12.5 mg Oral Daily  . LORazepam  0-4 mg Intravenous Q6H   Followed by  . [START ON 01/03/2019] LORazepam  0-4 mg Intravenous Q12H  . losartan  100 mg Oral Daily  . [START ON 01/06/2019] methotrexate  5 mg Oral Q Sun  . mometasone-formoterol  2 puff Inhalation BID  . montelukast  10 mg Oral QHS  . multivitamin with minerals  1 tablet Oral Daily  . nitroGLYCERIN  0.8 mg Sublingual Once  . polyvinyl alcohol  1 drop Both Eyes Daily  . thiamine  100 mg Oral Daily   Or  . thiamine  100 mg Intravenous Daily   Continuous Infusions:  Principal Problem:   Chest pain Active Problems:   COPD (chronic obstructive pulmonary disease) ([facility])   Hypokalemia   Leukocytosis   Abnormal LFTs   HTN (hypertension)   Alcohol use    Time spent: 45 min    [place] NP  [facility]  If 7PM-7AM, please contact night-coverage at [email or web address], password [facility] 01/01/2019, 2:15 PM  LOS: 0 days

## 2019-01-01 NOTE — ED Notes (Signed)
ED TO INPATIENT HANDOFF REPORT  ED Nurse Name and Phone #: Tray Martinique, [phone]  S Name/Age/Gender Emma [name] 78 y.o. female Room/Bed: 016C/016C  Code Status   Code Status: Prior  Home/SNF/Other Home Patient oriented to: self, place, time and situation Is this baseline? Yes   Triage Complete: Triage complete  Chief Complaint CP  Triage Note BIB EMS from home. Pt reports onset of CP earlier this evening. Central with radiation into back. Tight in nature. Denies CP at the present time.   Allergies Allergies  Allergen Reactions  . Penicillins Hives, Rash and Other (See Comments)    PATIENT HAS HAD A PCN REACTION WITH IMMEDIATE RASH, FACIAL/TONGUE/THROAT SWELLING, SOB, OR LIGHTHEADEDNESS WITH HYPOTENSION:  #  #  #  YES  #  #  #   Has patient had a PCN reaction causing severe rash involving mucus membranes or skin necrosis: no Has patient had a PCN reaction that required hospitalization: no Has patient had a PCN reaction occurring within the last 10 years: #  #  #  YES  #  #  #    . Moxifloxacin     UNSPECIFIED REACTION     Level of Care/Admitting Diagnosis ED Disposition    ED Disposition Condition [facility]: [place] [100100]  Level of Care: Telemetry Cardiac [103]  I expect the patient will be discharged within 24 hours: No (not a candidate for 5C-Observation unit)  Covid Evaluation: Screening Protocol (No Symptoms)  Diagnosis: Chest pain [700174]  Admitting Physician: Ivor Costa [[id number]]  Attending Physician: Ivor Costa [[id number]]  PT Class (Do Not Modify): Observation [104]  PT Acc Code (Do Not Modify): Observation [10022]       B Medical/Surgery History Past Medical History:  Diagnosis Date  . Allergy   . Asthma    as child  . 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 pt. unsure of pathology  . Carpal tunnel syndrome   . Cataract   . Condyloma   .  Constipation   . Hx of adenomatous colonic polyps   . Hypertension   . Hypothyroidism    2016- synthroid d/c'd   . Osteoarthritis    OA- knee- L knee, R thumb  . Osteopenia   . Thyroid disease   . Vitamin D deficiency    Past Surgical History:  Procedure Laterality Date  . BELPHAROPTOSIS REPAIR Bilateral 04/2015   vision correction  . BREAST BIOPSY     right   . CATARACT EXTRACTION, BILATERAL Bilateral 01/2014, 02/2014   /w IOL  . CESAREAN SECTION     x 2   . CHOLECYSTECTOMY    . EYE SURGERY    . GALLBLADDER SURGERY    . LAPAROSCOPIC APPENDECTOMY N/A 05/30/2018   Procedure: APPENDECTOMY LAPAROSCOPIC;  Surgeon: Leighton Ruff, MD;  Location[facility];  Service: General;  Laterality: N/A;  . SHOULDER SURGERY  2006   right   . SHOULDER SURGERY Left 07/17/2013    bone spurs, and partial tear of rotator cuff  . TOTAL KNEE ARTHROPLASTY Left 08/14/2017   Procedure: LEFT TOTAL KNEE ARTHROPLASTY;  Surgeon: Vickey Huger, MD;  Location: [place];  Service: Orthopedics;  Laterality: Left;  . TUBAL LIGATION       A IV Location/Drains/Wounds Patient Lines/Drains/Airways Status   Active Line/Drains/Airways    Name:   Placement date:   Placement time:  Site:   Days:   Peripheral IV 12/31/18 Left Antecubital   12/31/18    -    Antecubital   1   Closed System Drain 1 Posterior Abdomen Accordion (Hemovac) 19 Fr.   05/30/18    -    Abdomen   216   Incision (Closed) 08/14/17 Leg Left   08/14/17    1122     505   Incision - 3 Ports Abdomen 1: Umbilicus 2: Umbilicus;Posterior 3: Left;Lower;Lateral   05/30/18    2002     216          Intake/Output Last 24 hours No intake or output data in the 24 hours ending 01/01/19 0012  Labs/Imaging Results for orders placed or performed during the hospital encounter of 12/31/18 (from the past 48 hour(s))  Comprehensive metabolic panel     Status: Abnormal   Collection Time: 12/31/18  9:42 PM  Result Value Ref Range   Sodium 138 135 - 145 mmol/L    Potassium 3.4 (L) 3.5 - 5.1 mmol/L   Chloride 101 98 - 111 mmol/L   CO2 23 22 - 32 mmol/L   Glucose, Bld 152 (H) 70 - 99 mg/dL   BUN 26 (H) 8 - 23 mg/dL   Creatinine, Ser 0.98 0.44 - 1.00 mg/dL   Calcium 9.5 8.9 - 10.3 mg/dL   Total Protein 6.1 (L) 6.5 - 8.1 g/dL   Albumin 3.5 3.5 - 5.0 g/dL   AST 209 (H) 15 - 41 U/L   ALT 131 (H) 0 - 44 U/L   Alkaline Phosphatase 125 38 - 126 U/L   Total Bilirubin 0.9 0.3 - 1.2 mg/dL   GFR calc non Af Amer 55 (L) >60 mL/min   GFR calc Af Amer >60 >60 mL/min   Anion gap 14 5 - 15    Comment: Performed at [facility], [address], [place], [place] [address]  Troponin I - ONCE - STAT     Status: None   Collection Time: 12/31/18  9:42 PM  Result Value Ref Range   Troponin I <0.03 <0.03 ng/mL    Comment: Performed at [facility], [place] [address] [place], [name] [address]  CBC     Status: Abnormal   Collection Time: 12/31/18  9:42 PM  Result Value Ref Range   WBC 16.2 (H) 4.0 - 10.5 K/uL   RBC 3.90 3.87 - 5.11 MIL/uL   Hemoglobin 11.4 (L) 12.0 - 15.0 g/dL   HCT 35.9 (L) 36.0 - 46.0 %   MCV 92.1 80.0 - 100.0 fL   MCH 29.2 26.0 - 34.0 pg   MCHC 31.8 30.0 - 36.0 g/dL   RDW 14.6 11.5 - 15.5 %   Platelets 292 150 - 400 K/uL   nRBC 0.0 0.0 - 0.2 %    Comment: Performed at [facility], [place] [address] [place], [place] [address]  Lipase, blood     Status: None   Collection Time: 12/31/18  9:42 PM  Result Value Ref Range   Lipase 22 11 - 51 U/L    Comment: Performed at [place] [address], [place], [place] [address]   Dg Chest 2 View  Result Date: 12/31/2018 CLINICAL DATA:  Central chest pain. EXAM: CHEST - 2 VIEW COMPARISON:  None. FINDINGS: Mild elevation of right hemidiaphragm.The cardiomediastinal contours are normal. Streaky bibasilar atelectasis. Pulmonary vasculature is normal. No consolidation, pleural effusion, or pneumothorax. No acute osseous abnormalities  are seen. IMPRESSION: 1. Streaky bibasilar  atelectasis. 2. Lungs otherwise clear. Electronically Signed   By: Keith Rake M.D.   On: 12/31/2018 22:10    Pending Labs Unresulted Labs (From admission, onward)    Start     Ordered   12/31/18 2351  Novel Coronavirus,NAA,(SEND-OUT TO REF LAB - TAT 24-48 hrs); Hosp Order  (Asymptomatic Patients Labs)  Once,   STAT    Question:  Rule Out  Answer:  Yes   12/31/18 2350   12/31/18 2349  Troponin I - Now Then Q6H  Now then every 6 hours,   R (with STAT occurrences)     12/31/18 2349   Signed and Held  Hepatitis panel, acute  Once,   R     Signed and Held   Signed and Held  HIV Antibody (routine testing w rflx)  Once,   R     Signed and Held   Signed and Held  Rapid urine drug screen (hospital performed)  Once,   R     Signed and Held   Signed and Held  Hemoglobin A1c  Tomorrow morning,   R     Signed and Held   Signed and Held  Lipid panel  Tomorrow morning,   R    Comments: Please obtain as a fasting lipid panel - should not have eaten/ drank food for 8 hours prior to labs.    Signed and Held          Vitals/Pain Today's Vitals   12/31/18 2230 12/31/18 2245 12/31/18 2300 12/31/18 2315  BP: (!) 122/49 (!) 122/51 (!) 134/55 (!) 109/93  Pulse: 91 90 99 (!) 102  Resp: 18 15 18  (!) 28  Temp:      TempSrc:      SpO2: 99% 99% 99% 99%  Weight:      Height:      PainSc:        Isolation Precautions No active isolations  Medications Medications  aspirin chewable tablet 324 mg (324 mg Oral Not Given 12/31/18 2215)  LORazepam (ATIVAN) tablet 1 mg (has no administration in time range)    Or  LORazepam (ATIVAN) injection 1 mg (has no administration in time range)  thiamine (VITAMIN B-1) tablet 100 mg (has no administration in time range)    Or  thiamine (B-1) injection 100 mg (has no administration in time range)  folic acid (FOLVITE) tablet 1 mg (has no administration in time range)  multivitamin with minerals tablet 1 tablet (has no administration in time range)   LORazepam (ATIVAN) injection 0-4 mg (has no administration in time range)    Followed by  LORazepam (ATIVAN) injection 0-4 mg (has no administration in time range)  potassium chloride SA (K-DUR) CR tablet 30 mEq (has no administration in time range)  lactated ringers bolus 1,000 mL (1,000 mLs Intravenous New Bag/Given 12/31/18 2318)    Mobility walks Low fall risk   Focused Assessments Cardiac Assessment Handoff:  Cardiac Rhythm: Normal sinus rhythm Lab Results  Component Value Date   TROPONINI <0.03 12/31/2018   No results found for: DDIMER Does the Patient currently have chest pain? No     R Recommendations: See Admitting Provider Note  Report given to:   Additional Notes:

## 2019-01-01 NOTE — Consult Note (Addendum)
Cardiology Consultation:   Patient ID: Emma [name] MRN: [id number]; DOB: [birth year]  Admit date: 12/31/2018 Date of Consult: 01/01/2019  Primary Care Provider: Shon Baton, MD Primary Cardiologist: New to Dr. Gwenlyn Found   Patient Profile:   Emma [name] is a 78 y.o. female with a hx of HTN, COPD,  Hypothyroidism and daily alcohol use who is being seen today for the evaluation of chest pain at the request of Dr. Blaine Hamper.   History of Present Illness:   Emma [name] was in usual state of health up until yesterday when she had lower midsternal/epigastric pain while watering her garden.  She described her pain as pressure-like sensation.  Radiating to her back.  Associated with diaphoresis. 8No dyspnea, nausea or vomiting.  Pain lasted for approximately 2 hours. 8/10 intensity and EMS was called.  She was given aspirin 324 mg and sublingual nitroglycerin x1 with improvement and eventually resolution of her pain.  No recurrence.  She denies prior similar episode.  She does yard work without any discomfort previously.  She denies palpitation, orthopnea, PND, dizziness, syncope, fever, chills or COVID exposure.  Chronic ankle edema right greater than left.  Patient was admitted and ruled out.  Troponin negative x3.  LDL 76.  Elevated AST/ALT.  Normal lipase.  Potassium 3.4.  WBC 16.  Pending COVID-19 test.  X-ray without acute abnormality.  Heart Pathway Score:  HEAR Score: 4  Past Medical History:  Diagnosis Date  . Allergy   . Asthma    as child  . 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 [facility])    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 pt. unsure of pathology  . Carpal tunnel syndrome   . Cataract   . Condyloma   . Constipation   . Hx of adenomatous colonic polyps   . Hypertension   . Hypothyroidism    2016- synthroid d/c'd   . Osteoarthritis    OA- knee- L knee, R thumb  . Osteopenia   . Thyroid disease   . Vitamin D deficiency     Past Surgical History:  Procedure  Laterality Date  . BELPHAROPTOSIS REPAIR Bilateral 04/2015   vision correction  . BREAST BIOPSY     right   . CATARACT EXTRACTION, BILATERAL Bilateral 01/2014, 02/2014   /w IOL  . CESAREAN SECTION     x 2   . CHOLECYSTECTOMY    . EYE SURGERY    . GALLBLADDER SURGERY    . LAPAROSCOPIC APPENDECTOMY N/A 05/30/2018   Procedure: APPENDECTOMY LAPAROSCOPIC;  Surgeon: Leighton Ruff, MD;  Location: [facility];  Service: General;  Laterality: N/A;  . SHOULDER SURGERY  2006   right   . SHOULDER SURGERY Left 07/17/2013    bone spurs, and partial tear of rotator cuff  . TOTAL KNEE ARTHROPLASTY Left 08/14/2017   Procedure: LEFT TOTAL KNEE ARTHROPLASTY;  Surgeon: Vickey Huger, MD;  Location: [place];  Service: Orthopedics;  Laterality: Left;  . TUBAL LIGATION      Inpatient Medications: Scheduled Meds: . aspirin  324 mg Oral Once  . aspirin EC  325 mg Oral Daily  . cholecalciferol  1,000 Units Oral Daily  . enoxaparin (LOVENOX) injection  40 mg Subcutaneous Daily  . folic acid  1 mg Oral Daily  . gabapentin  300 mg Oral QHS  . hydrochlorothiazide  12.5 mg Oral Daily  . LORazepam  0-4 mg Intravenous Q6H   Followed by  . [START ON 01/03/2019] LORazepam  0-4  mg Intravenous Q12H  . losartan  100 mg Oral Daily  . [START ON 01/06/2019] methotrexate  5 mg Oral Q Sun  . mometasone-formoterol  2 puff Inhalation BID  . montelukast  10 mg Oral QHS  . multivitamin with minerals  1 tablet Oral Daily  . polyvinyl alcohol  1 drop Both Eyes Daily  . thiamine  100 mg Oral Daily   Or  . thiamine  100 mg Intravenous Daily   Continuous Infusions:  PRN Meds: albuterol, hydrALAZINE, LORazepam **OR** LORazepam, morphine injection, nitroGLYCERIN, ondansetron (ZOFRAN) IV  Allergies:    Allergies  Allergen Reactions  . Penicillins Hives, Rash and Other (See Comments)    PATIENT HAS HAD A PCN REACTION WITH IMMEDIATE RASH, FACIAL/TONGUE/THROAT SWELLING, SOB, OR LIGHTHEADEDNESS WITH HYPOTENSION:  #  #  #  YES  #  #  #    Has patient had a PCN reaction causing severe rash involving mucus membranes or skin necrosis: no Has patient had a PCN reaction that required hospitalization: no Has patient had a PCN reaction occurring within the last 10 years: #  #  #  YES  #  #  #    . Moxifloxacin Other (See Comments)    Unknown      Social History:   Social History   Socioeconomic History  . Marital status: Divorced    Spouse name: Not on file  . Number of children: 2  . Years of education: Not on file  . Highest education level: Not on file  Occupational History  . Occupation: Retired  Scientific laboratory technician  . Financial resource strain: Not on file  . Food insecurity    Worry: Not on file    Inability: Not on file  . Transportation needs    Medical: Not on file    Non-medical: Not on file  Tobacco Use  . Smoking status: Former Smoker    Packs/day: 0.25    Years: 50.00    Pack years: 12.50    Types: Cigarettes    Quit date: 08/11/2017    Years since quitting: 1.3  . Smokeless tobacco: Never Used  Substance and Sexual Activity  . Alcohol use: Yes    Alcohol/week: 7.0 standard drinks    Types: 7 Standard drinks or equivalent per week  . Drug use: No  . Sexual activity: Not Currently    Partners: Male    Birth control/protection: Post-menopausal  Lifestyle  . Physical activity    Days per week: Not on file    Minutes per session: Not on file  . Stress: Not on file  Relationships  . Social Herbalist on phone: Not on file    Gets together: Not on file    Attends religious service: Not on file    Active member of club or organization: Not on file    Attends meetings of clubs or organizations: Not on file    Relationship status: Not on file  . Intimate partner violence    Fear of current or ex partner: Not on file    Emotionally abused: Not on file    Physically abused: Not on file    Forced sexual activity: Not on file  Other Topics Concern  . Not on file  Social History Narrative    2 caffeine drinks daily     Family History:   Family History  Problem Relation Age of Onset  . Heart disease Father   . Diabetes Paternal Grandfather   .  Asthma Son   . Colon cancer Neg Hx      ROS:  Please see the history of present illness.  All other ROS reviewed and negative.     Physical Exam/Data:   Vitals:   01/01/19 0054 01/01/19 0339 01/01/19 0724 01/01/19 0844  BP: (!) 152/64 (!) 133/50 (!) 148/58 (!) 151/58  Pulse: (!) 103 95 90 81  Resp: 18 18 20 20   Temp: 97.9 F (36.6 C) 98.4 F (36.9 C) 97.8 F (36.6 C) 98.1 F (36.7 C)  TempSrc: Oral Oral Oral Oral  SpO2: 100% 98% 99%   Weight: 64 kg     Height:        Intake/Output Summary (Last 24 hours) at 01/01/2019 0913 Last data filed at 01/01/2019 0500 Gross per 24 hour  Intake 222 ml  Output -  Net 222 ml   Last 3 Weights 01/01/2019 12/31/2018 11/29/2018  Weight (lbs) 141 lb 3.2 oz 155 lb 156 lb 3.2 oz  Weight (kg) 64.048 kg 70.308 kg 70.852 kg     Body mass index is 26.68 kg/m.  General:  Well nourished, well developed, in no acute distress HEENT: normal Lymph: no adenopathy Neck: no JVD Endocrine:  No thryomegaly Vascular: No carotid bruits; FA pulses 2+ bilaterally without bruits  Cardiac:  normal S1, S2; RRR; no murmur.  Tender to palpation at epigastric/lower sternal area Lungs:  clear to auscultation bilaterally, no wheezing, rhonchi or rales  Abd: soft, no hepatomegaly  Ext: no edema Musculoskeletal:  No deformities, BUE and BLE strength normal and equal Skin: warm and dry  Neuro:  CNs 2-12 intact, no focal abnormalities noted Psych:  Normal affect   EKG:  The EKG was personally reviewed and demonstrates:  NSR with non specific St/t wave changes Telemetry:  Telemetry was personally reviewed and demonstrates: Normal sinus rhythm  Relevant CV Studies: As summarized above  Laboratory Data:  High Sensitivity Troponin:  No results for input(s): TROPONINIHS in the last 720 hours.   Cardiac  Enzymes Recent Labs  Lab 12/31/18 2142 01/01/19 0010 01/01/19 0457  TROPONINI <0.03 <0.03 <0.03   No results for input(s): TROPIPOC in the last 168 hours.  Chemistry Recent Labs  Lab 12/31/18 2142  NA 138  K 3.4*  CL 101  CO2 23  GLUCOSE 152*  BUN 26*  CREATININE 0.98  CALCIUM 9.5  GFRNONAA 55*  GFRAA >60  ANIONGAP 14    Recent Labs  Lab 12/31/18 2142  PROT 6.1*  ALBUMIN 3.5  AST 209*  ALT 131*  ALKPHOS 125  BILITOT 0.9   Hematology Recent Labs  Lab 12/31/18 2142  WBC 16.2*  RBC 3.90  HGB 11.4*  HCT 35.9*  MCV 92.1  MCH 29.2  MCHC 31.8  RDW 14.6  PLT 292    Radiology/Studies:  Dg Chest 2 View  Result Date: 12/31/2018 CLINICAL DATA:  Central chest pain. EXAM: CHEST - 2 VIEW COMPARISON:  None. FINDINGS: Mild elevation of right hemidiaphragm.The cardiomediastinal contours are normal. Streaky bibasilar atelectasis. Pulmonary vasculature is normal. No consolidation, pleural effusion, or pneumothorax. No acute osseous abnormalities are seen. IMPRESSION: 1. Streaky bibasilar atelectasis. 2. Lungs otherwise clear. Electronically Signed   By: Keith Rake M.D.   On: 12/31/2018 22:10    Assessment and Plan:   1. Chest pain -Uncertain etiology.  Troponin negative.  EKG without acute ischemic changes.  Low suspicious for ACS.  Differential includes GI etiology given elevated LFTs and musculoskeletal as tender to palpation (reproducible pain) at  epigastric/lower sternal area. -Plan inpatient coronary CT today.   2.  Elevated LFTs -Pending hepatitis panel.  Work-up per primary team -Pending COVID   3.  Hypertension -Blood pressure relatively stable on current medication  4.  COPD -No active disease.    For questions or updates, please contact [place] Please consult [email or web address] for contact info under     Jarrett Soho, PA  01/01/2019 9:13 AM   Agree with note by Robbie Lis PA-C  We are asked to see Emma [name] because of an  episode of chest pain that lasted approximately an hour.  Her risk factors include a long history tobacco abuse having quit 1 year ago and treated hypertension.  Her pain began spontaneously.  It was substernal.  There was associated diaphoresis.  It did somewhat improve with the administration of sublingual nitroglycerin.  Her enzymes are negative.  EKG shows no acute changes.  Exam is benign.  We will get a coronary CTA prior to discharge to risk stratify her.  Lorretta Harp, M.D., [place], [facility], [place] [address]. [place], [place]  [address]  [phone] 01/01/2019 10:11 AM

## 2019-01-01 NOTE — Procedures (Addendum)
PROCEDURE SUMMARY:  Successful placement of image-guided right basilic IV start. No complications. EBL = zero Ready for use.  Infiltrated right AC IV removed prior to procedure without difficulty.  Please see imaging section of Epic for full dictation.  Joaquim Nam PA-C 01/01/2019 3:55 PM

## 2019-01-01 NOTE — Plan of Care (Signed)
°  Problem: Education: °Goal: Understanding of cardiac disease, CV risk reduction, and recovery process will improve °Outcome: Progressing °Goal: Individualized Educational Video(s) °Outcome: Progressing °  °

## 2019-01-02 ENCOUNTER — Encounter (HOSPITAL_COMMUNITY): Payer: Self-pay

## 2019-01-02 DIAGNOSIS — Z7289 Other problems related to lifestyle: Secondary | ICD-10-CM | POA: Diagnosis not present

## 2019-01-02 DIAGNOSIS — R079 Chest pain, unspecified: Secondary | ICD-10-CM

## 2019-01-02 DIAGNOSIS — J449 Chronic obstructive pulmonary disease, unspecified: Secondary | ICD-10-CM | POA: Diagnosis not present

## 2019-01-02 DIAGNOSIS — I10 Essential (primary) hypertension: Secondary | ICD-10-CM | POA: Diagnosis not present

## 2019-01-02 LAB — COMPREHENSIVE METABOLIC PANEL
ALT: 355 U/L — ABNORMAL HIGH (ref 0–44)
AST: 149 U/L — ABNORMAL HIGH (ref 15–41)
Albumin: 3.4 g/dL — ABNORMAL LOW (ref 3.5–5.0)
Alkaline Phosphatase: 139 U/L — ABNORMAL HIGH (ref 38–126)
Anion gap: 10 (ref 5–15)
BUN: 17 mg/dL (ref 8–23)
CO2: 23 mmol/L (ref 22–32)
Calcium: 9.4 mg/dL (ref 8.9–10.3)
Chloride: 105 mmol/L (ref 98–111)
Creatinine, Ser: 0.82 mg/dL (ref 0.44–1.00)
GFR calc Af Amer: >60 mL/min (ref >60)
GFR calc non Af Amer: >60 mL/min (ref >60)
Glucose, Bld: 151 mg/dL — ABNORMAL HIGH (ref 70–99)
Potassium: 4.4 mmol/L (ref 3.5–5.1)
Sodium: 138 mmol/L (ref 135–145)
Total Bilirubin: 2 mg/dL — ABNORMAL HIGH (ref 0.3–1.2)
Total Protein: 6.2 g/dL — ABNORMAL LOW (ref 6.5–8.1)

## 2019-01-02 LAB — NOVEL CORONAVIRUS, NAA (HOSP ORDER, SEND-OUT TO REF LAB; TAT 18-24 HRS): SARS-CoV-2, NAA: NOT DETECTED

## 2019-01-02 LAB — HEPATITIS PANEL, ACUTE
HCV Ab: <0.1 s/co ratio (ref 0.0–0.9)
Hep A IgM: NEGATIVE
Hep B C IgM: NEGATIVE
Hepatitis B Surface Ag: NEGATIVE

## 2019-01-02 MED ORDER — METOPROLOL TARTRATE 25 MG PO TABS: 25.0000 mg | ORAL_TABLET | Freq: Two times a day (BID) | ORAL | 0 refills | Status: DC

## 2019-01-02 MED ORDER — METOPROLOL TARTRATE 25 MG PO TABS
25.0000 mg | ORAL_TABLET | Freq: Two times a day (BID) | ORAL | Status: DC
  Administered 2019-01-02: 25 mg via ORAL
  Filled 2019-01-02: qty 1

## 2019-01-02 MED ORDER — ASPIRIN EC 81 MG PO TBEC: 81.0000 mg | DELAYED_RELEASE_TABLET | Freq: Every day | ORAL | Status: AC

## 2019-01-02 NOTE — Discharge Summary (Signed)
Physician Discharge Summary  SWAN FAIRFAX BOF:[id number] DOB: Sep 26, 1940 DOA: 12/31/2018  PCP: Shon Baton, MD  Admit date: 12/31/2018 Discharge date: 01/02/2019  Time spent: 45 minutes  Recommendations for Outpatient Follow-up:  1. Follow up with cardiology as scheduled 2. Take medications as prescribed.  3. Follow up with PCP 2-3 weeks. Recommend Liver function tests   Discharge Diagnoses:  Principal Problem:   Chest pain with moderate risk for cardiac etiology Active Problems:   Abnormal LFTs   HTN (hypertension)   COPD (chronic obstructive pulmonary disease) (HCC)   Hypokalemia   Alcohol use   Leukocytosis   Discharge Condition: stable  Diet recommendation: heart healthy  Filed Weights   12/31/18 2106 01/01/19 0054 01/02/19 0316  Weight: 70.3 kg 64 kg 62.4 kg    History of present illness:  Emma [name] is a 78 y.o. female with medical history significant of hypertension, COPD, hypothyroidism, who presented 6/22 with CP.   Pt stated that her chest pain started in the evening, and located in the center chest, initially 8 out of 10 in severity..  Her chest pain was pressure-like, radiating to the mid back.  Patient denied  shortness of breath, fever or chills.  She had mild dry cough.  Patient denied nausea, vomiting, diarrhea, abdominal pain, symptoms of UTI. Patient stated that she had one episode of lightheadedness, which has resolved.  No unilateral weakness, numbness or tingling in her extremities. No facial droop or slurred speech. Pt stated that she was taking methotrexate weekly for rash which was not clear about the etiology. Pain free on admission  Hospital Course:  Chest pain:Troponin negative x3. EKG showed no acute ischemic change. No events on tele. Evaluated by cards who opine low suspicion for ACS and recommend coronary CT that revealed distal non-dominant circumflex stenosis. No recurrent symptoms. Cards recommended medical management with low dose  BB.  Lipid panel  Within limits of normal.   COPD (chronic obstructive pulmonary disease) ([place]): stable. Former smoker. Oxygen saturation level greater than 90%  Hypokalemia: K=3.4on admission. - Repleted and resolved  Leukocytosis:WBC 16.2. No source of infection identified. Patient does not have fever. Likely due to reactive response.   Abnormal LFTs:ALP 125, AST 209, ALT 131, total bilirubin 0.9.Etiology is not clear. The ratio of AST 209/ALT 131 is consistent with alcohol use. Hepatitis panel negative.  She admits to 1-2 beers daily  HTN:  -controlled  Alcohol use: ptstates that she drinks beer every day, mostly 1 beer each time. Currently no signs of withdrawal. -Start CIWA protocol -Did counseling about importance of cutting down drinking.   Procedures:  Coronary CT 6/23  Consultations:  Dr berry cards  Discharge Exam: Vitals:   01/02/19 0900 01/02/19 0947  BP:  (!) 137/53  Pulse:  70  Resp:    Temp:    SpO2: 99%     General: sitting on side of bed in no acute distress Cardiovascular: rrr no mgr no LE edema Respiratory: normal effort BS clear bilaterally no wheeze  Discharge Instructions    Allergies as of 01/02/2019      Reactions   Penicillins Hives, Rash, Other (See Comments)   PATIENT HAS HAD A PCN REACTION WITH IMMEDIATE RASH, FACIAL/TONGUE/THROAT SWELLING, SOB, OR LIGHTHEADEDNESS WITH HYPOTENSION:  #  #  #  YES  #  #  #   Has patient had a PCN reaction causing severe rash involving mucus membranes or skin necrosis: no Has patient had a PCN reaction that required hospitalization:  no Has patient had a PCN reaction occurring within the last 10 years: #  #  #  YES  #  #  #    Moxifloxacin Other (See Comments)   Unknown        Medication List    TAKE these medications   acetaminophen 500 MG tablet Commonly known as: TYLENOL Take 500-1,900 mg by mouth every 6 (six) hours as needed for mild pain.   aspirin EC 81 MG tablet Take 1  tablet (81 mg total) by mouth daily.   cetaphil cream Apply 1 application topically daily as needed (dry skin).   cholecalciferol 1000 units tablet Commonly known as: VITAMIN D Take 1,000 Units by mouth See admin instructions. Take 1 tablet daily except Sundays.   folic acid 1 MG tablet Commonly known as: FOLVITE Take 1 mg by mouth daily.   furosemide 20 MG tablet Commonly known as: LASIX Take 20 mg by mouth every Monday, Wednesday, and Friday.   gabapentin 300 MG capsule Commonly known as: NEURONTIN Take 300 mg by mouth at bedtime.   ibuprofen 200 MG tablet Commonly known as: ADVIL Take 200-400 mg by mouth every 6 (six) hours as needed for fever, headache or mild pain.   Klor-Con M20 20 MEQ tablet Generic drug: potassium chloride SA Take 20 mEq by mouth 2 (two) times daily.   losartan-hydrochlorothiazide 100-12.5 MG tablet Commonly known as: HYZAAR Take 1 tablet by mouth daily.   methotrexate 2.5 MG tablet Commonly known as: RHEUMATREX Take 2.5-5 mg by mouth See admin instructions. Takes one or two tablets once a week on Sundays. Takes for psoriasis/itching. Dose depends on itching.   metoprolol tartrate 25 MG tablet Commonly known as: LOPRESSOR Take 1 tablet (25 mg total) by mouth 2 (two) times daily.   montelukast 10 MG tablet Commonly known as: SINGULAIR Take 10 mg by mouth at bedtime.   SYSTANE BALANCE OP Place 1 drop into both eyes 2 (two) times daily as needed (dry eyes).   Vitamin D (Ergocalciferol) 1.25 MG (50000 UT) Caps capsule Commonly known as: DRISDOL Take 50,000 Units by mouth every Sunday.   Wixela Inhub 250-50 MCG/DOSE Aepb Generic drug: Fluticasone-Salmeterol Inhale 1 puff into the lungs daily.      Allergies  Allergen Reactions  . Penicillins Hives, Rash and Other (See Comments)    PATIENT HAS HAD A PCN REACTION WITH IMMEDIATE RASH, FACIAL/TONGUE/THROAT SWELLING, SOB, OR LIGHTHEADEDNESS WITH HYPOTENSION:  #  #  #  YES  #  #  #   Has  patient had a PCN reaction causing severe rash involving mucus membranes or skin necrosis: no Has patient had a PCN reaction that required hospitalization: no Has patient had a PCN reaction occurring within the last 10 years: #  #  #  YES  #  #  #    . Moxifloxacin Other (See Comments)    Unknown     Follow-up Information    Shon Baton, MD.   Specialty: Internal Medicine Why: Brynda Peon, RN will call you and make appt. / left a message on voice mail Contact information: [place] [address] [phone]            The results of significant diagnostics from this hospitalization (including imaging, microbiology, ancillary and laboratory) are listed below for reference.    Significant Diagnostic Studies: Dg Chest 2 View  Result Date: 12/31/2018 CLINICAL DATA:  Central chest pain. EXAM: CHEST - 2 VIEW COMPARISON:  None. FINDINGS:  Mild elevation of right hemidiaphragm.The cardiomediastinal contours are normal. Streaky bibasilar atelectasis. Pulmonary vasculature is normal. No consolidation, pleural effusion, or pneumothorax. No acute osseous abnormalities are seen. IMPRESSION: 1. Streaky bibasilar atelectasis. 2. Lungs otherwise clear. Electronically Signed   By: Keith Rake M.D.   On: 12/31/2018 22:10   Ir US Guide Vasc Access Right  Result Date: 01/02/2019 CLINICAL DATA:  Poor venous access  EXAM: ULTRASOUND GUIDED VENIPUNCTURE BY MD  CONTRAST:  None  COMPLICATIONS: None immediate  TECHNIQUE: The right upper arm was prepped with chlorhexidine in a sterile fashion, and a sterile drape was applied covering the operative field. Local anesthesia was provided with 1% lidocaine.  Under direct ultrasound guidance, the right basilic vein was accessed with a micropuncture kit after the overlying soft tissues were anesthetized with 1% lidocaine. An ultrasound image was saved for documentation purposes. The micropuncture sheath easily aspirated and flushed and was  secured in place. A dressing was placed. The patient tolerated the procedure well without immediate post procedural complication.  IMPRESSION: Successful ultrasound guided placement of a right basilic vein approach micropuncture sheath for temporary venous access.  Read by Candiss Norse, PA-C   Electronically Signed   By: Sandi Mariscal M.D.   On: 01/01/2019 15:58   Ct Coronary Morph W/cta Cor W/score W/ca W/cm &/or Wo/cm  Addendum Date: 01/02/2019   ADDENDUM REPORT: 01/02/2019 08:48 EXAM: OVER-READ INTERPRETATION  CT CHEST The following report is an over-read performed by radiologist Dr. Alvino Blood [facility] on 01/02/2019. This over-read does not include interpretation of cardiac or coronary anatomy or pathology. The CT a interpretation by the cardiologist is attached. COMPARISON:  None. FINDINGS: Limited view of the lung parenchyma demonstrates no suspicious nodularity. Airways are normal. Limited view of the mediastinum demonstrates no adenopathy. Esophagus normal. Limited view of the upper abdomen unremarkable. Limited view of the skeleton and chest wall is unremarkable. IMPRESSION: No significant extracardiac findings. Electronically Signed   By: Suzy Bouchard M.D.   On: 01/02/2019 08:48   Addendum Date: 01/02/2019   ADDENDUM REPORT: 01/02/2019 08:17 EXAM: CT FFR ANALYSIS CLINICAL DATA:  78 year old female with chest pain. FINDINGS: FFRct analysis was performed on the original cardiac CT angiogram dataset. Diagrammatic representation of the FFRct analysis is provided in a separate PDF document in PACS. This dictation was created using the PDF document and an interactive 3D model of the results. 3D model is not available in the EMR/PACS. Normal FFR range is >0.80. 1. Left Main: 0.99. 2. LAD: 0.89. 3. Proximal: 1.0, distal 0.55. 4. RCA: 0.92. IMPRESSION: 1. 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 Electronically  Signed   By: Ena Dawley   On: 01/02/2019 08:17   Result Date: 01/02/2019 CLINICAL DATA:  78 year old female with h/o hypertension and chest pain. EXAM: Cardiac/Coronary  CT TECHNIQUE: The patient was scanned on a [employer]. FINDINGS: A 120 kV prospective scan was triggered in the descending thoracic aorta at 111 HU's. Axial non-contrast 3 mm slices were carried out through the heart. The data set was analyzed on a dedicated work station and scored using the Herman. Gantry rotation speed was 250 msecs and collimation was .6 mm. No beta blockade and 0.8 mg of sl NTG was given. The 3D data set was reconstructed in 5% intervals of the 67-82 % of the R-R cycle. Diastolic phases were analyzed on a dedicated work station using MPR, MIP and VRT modes. The patient  received 80 cc of contrast. Aorta: Normal size. Mild diffuse calcifications and atherosclerotic plaque. No dissection. Aortic Valve:  Trileaflet.  No calcifications. Coronary Arteries:  Normal coronary origin.  Right dominance. RCA is a large dominant artery that gives rise to PDA and PLA. Proximal RCA has a moderate long calcified plaque with stenosis 50-69%. Mid RCA has mild non-calcified plaque with stenosis 25-49%. Distal RCA has only minimal plaque. PDA has minimal plaque. PLA is a very small artery. Left main is a large artery that gives rise to LAD, ramus intermedius and LCX arteries. Left main has no plaque. LAD is a moderate caliber vessel with mild calcified plaque in the proximal portion with associated stenosis 25-49%. Mid and distal LAD has no obvious plaque. Ramus intermedius is a medium size branch with moderate mixed plaque in the proximal portion with stenosis 50-69%. LCX is a small non-dominant artery that has mild calcified plaque with stenosis 25-49%. Other findings: Normal pulmonary vein drainage into the left atrium. Normal let atrial appendage without a thrombus. Normal size of the pulmonary artery. IMPRESSION: 1.  Coronary calcium score of 352. This was 26 percentile for age and sex matched control. 2. Normal coronary origin with right dominance. 3. Moderate plaque in the proximal RCA, proximal portion of ramus intermedius and mild plaque in the mid RCA and proximal LAD. CAD RADS. Additional analysis with CT FFR will be submitted. Electronically Signed: By: Ena Dawley On: 01/01/2019 17:23   Ir Radiology Peripheral Guided Iv Start  Result Date: 01/01/2019 CLINICAL DATA:  Poor venous access EXAM: ULTRASOUND GUIDED VENIPUNCTURE BY MD CONTRAST:  None COMPLICATIONS: None immediate TECHNIQUE: The right upper arm was prepped with chlorhexidine in a sterile fashion, and a sterile drape was applied covering the operative field. Local anesthesia was provided with 1% lidocaine. Under direct ultrasound guidance, the right basilic vein was accessed with a micropuncture kit after the overlying soft tissues were anesthetized with 1% lidocaine. An ultrasound image was saved for documentation purposes. The micropuncture sheath easily aspirated and flushed and was secured in place. A dressing was placed. The patient tolerated the procedure well without immediate post procedural complication. IMPRESSION: Successful ultrasound guided placement of a right basilic vein approach micropuncture sheath for temporary venous access. Read by Candiss Norse, PA-C Electronically Signed   By: Sandi Mariscal M.D.   On: 01/01/2019 15:58   Vas [place] Lower Extremity Venous (dvt)  Result Date: 12/17/2018  Lower Venous Study Indications: Swelling, and Pain.  Performing Technologist: Caralee Ates BA, RVT, RDMS  Examination Guidelines: A complete evaluation includes B-mode imaging, spectral Doppler, color Doppler, and power Doppler as needed of all accessible portions of each vessel. Bilateral testing is considered an integral part of a complete examination. Limited examinations for reoccurring indications may be performed as noted.   +---------+---------------+---------+-----------+----------+-------+ RIGHT    CompressibilityPhasicitySpontaneityPropertiesSummary +---------+---------------+---------+-----------+----------+-------+ CFV      Full           Yes      Yes                          +---------+---------------+---------+-----------+----------+-------+ SFJ      Full           Yes      Yes                          +---------+---------------+---------+-----------+----------+-------+ FV Prox  Full  Yes      Yes                          +---------+---------------+---------+-----------+----------+-------+ FV Mid   Full           Yes      Yes                          +---------+---------------+---------+-----------+----------+-------+ FV DistalFull           Yes      Yes                          +---------+---------------+---------+-----------+----------+-------+ PFV      Full           Yes      Yes                          +---------+---------------+---------+-----------+----------+-------+ POP      Full           Yes      Yes                          +---------+---------------+---------+-----------+----------+-------+ PTV      Full           Yes      Yes                          +---------+---------------+---------+-----------+----------+-------+ PERO     Full           Yes      Yes                          +---------+---------------+---------+-----------+----------+-------+ GSV      Full           Yes      Yes                          +---------+---------------+---------+-----------+----------+-------+   +----+---------------+---------+-----------+----------+-------+ LEFTCompressibilityPhasicitySpontaneityPropertiesSummary +----+---------------+---------+-----------+----------+-------+ CFV Full           Yes      Yes                          +----+---------------+---------+-----------+----------+-------+ SFJ Full           Yes      Yes                           +----+---------------+---------+-----------+----------+-------+    Findings reported to [facility] at Dr. Keane Police at 1:30 pm.  Summary: Right: There is no evidence of deep vein thrombosis in the lower extremity. Left: No evidence of common femoral vein obstruction.  *See table(s) above for measurements and observations. Electronically signed by Harold Barban MD on 12/17/2018 at 2:41:53 PM.    Final     Microbiology: No results found for this or any previous visit (from the past 240 hour(s)).   Labs: Basic Metabolic Panel: Recent Labs  Lab 12/31/18 2142  NA 138  K 3.4*  CL 101  CO2 23  GLUCOSE 152*  BUN 26*  CREATININE 0.98  CALCIUM 9.5   Liver Function Tests: Recent Labs  Lab 12/31/18 2142  AST 209*  ALT 131*  ALKPHOS 125  BILITOT 0.9  PROT 6.1*  ALBUMIN 3.5   Recent Labs  Lab 12/31/18 2142  LIPASE 22   No results for input(s): AMMONIA in the last 168 hours. CBC: Recent Labs  Lab 12/31/18 2142  WBC 16.2*  HGB 11.4*  HCT 35.9*  MCV 92.1  PLT 292   Cardiac Enzymes: Recent Labs  Lab 12/31/18 2142 01/01/19 0010 01/01/19 0457 01/01/19 1112  TROPONINI <0.03 <0.03 <0.03 <0.03   BNP: BNP (last 3 results) No results for input(s): BNP in the last 8760 hours.  ProBNP (last 3 results) No results for input(s): PROBNP in the last 8760 hours.  CBG: No results for input(s): GLUCAP in the last 168 hours.     SignedRadene Gunning NP [facility] 01/02/2019, 10:28 AM

## 2019-01-02 NOTE — Progress Notes (Addendum)
Progress Note  Patient Name: Emma [name] Date of Encounter: 01/02/2019  Primary Cardiologist: Quay Burow, MD   Subjective   No further chest pain since admission.   Inpatient Medications    Scheduled Meds:  aspirin  324 mg Oral Once   aspirin EC  325 mg Oral Daily   cholecalciferol  1,000 Units Oral Daily   enoxaparin (LOVENOX) injection  40 mg Subcutaneous Daily   folic acid  1 mg Oral Daily   gabapentin  300 mg Oral QHS   hydrochlorothiazide  12.5 mg Oral Daily   LORazepam  0-4 mg Intravenous Q6H   Followed by   Derrill Memo ON 01/03/2019] LORazepam  0-4 mg Intravenous Q12H   losartan  100 mg Oral Daily   [START ON 01/06/2019] methotrexate  5 mg Oral Q Sun   mometasone-formoterol  2 puff Inhalation BID   montelukast  10 mg Oral QHS   multivitamin with minerals  1 tablet Oral Daily   polyvinyl alcohol  1 drop Both Eyes Daily   thiamine  100 mg Oral Daily   Or   thiamine  100 mg Intravenous Daily   Continuous Infusions:  PRN Meds: albuterol, hydrALAZINE, LORazepam **OR** LORazepam, morphine injection, nitroGLYCERIN, ondansetron (ZOFRAN) IV   Vital Signs    Vitals:   01/01/19 1714 01/01/19 1940 01/01/19 1952 01/02/19 0316  BP: (!) 146/58  (!) 139/58 (!) 118/58  Pulse: 71  70 75  Resp: 18  18 18   Temp: 98.1 F (36.7 C)  98 F (36.7 C) 98.1 F (36.7 C)  TempSrc: Oral  Oral Oral  SpO2: 99% 98% 98% 96%  Weight:    62.4 kg  Height:        Intake/Output Summary (Last 24 hours) at 01/02/2019 0859 Last data filed at 01/02/2019 0837 Gross per 24 hour  Intake 600 ml  Output 150 ml  Net 450 ml   Last 3 Weights 01/02/2019 01/01/2019 12/31/2018  Weight (lbs) 137 lb 9.1 oz 141 lb 3.2 oz 155 lb  Weight (kg) 62.4 kg 64.048 kg 70.308 kg      Telemetry    SR - Personally Reviewed  ECG    No tracing this morning. - Personally Reviewed  Physical Exam  Older white female GEN: No acute distress.   Neck: No JVD Cardiac: RRR, no murmurs, rubs, or  gallops.  Respiratory: Clear to auscultation bilaterally. GI: Soft, nontender, non-distended  MS: No edema; No deformity. Neuro:  Nonfocal  Psych: Normal affect   Labs    High Sensitivity Troponin:  No results for input(s): TROPONINIHS in the last 720 hours.    Cardiac Enzymes Recent Labs  Lab 12/31/18 2142 01/01/19 0010 01/01/19 0457 01/01/19 1112  TROPONINI <0.03 <0.03 <0.03 <0.03   No results for input(s): TROPIPOC in the last 168 hours.   Chemistry Recent Labs  Lab 12/31/18 2142  NA 138  K 3.4*  CL 101  CO2 23  GLUCOSE 152*  BUN 26*  CREATININE 0.98  CALCIUM 9.5  PROT 6.1*  ALBUMIN 3.5  AST 209*  ALT 131*  ALKPHOS 125  BILITOT 0.9  GFRNONAA 55*  GFRAA >60  ANIONGAP 14     Hematology Recent Labs  Lab 12/31/18 2142  WBC 16.2*  RBC 3.90  HGB 11.4*  HCT 35.9*  MCV 92.1  MCH 29.2  MCHC 31.8  RDW 14.6  PLT 292    BNPNo results for input(s): BNP, PROBNP in the last 168 hours.   DDimer No results  for input(s): DDIMER in the last 168 hours.   Radiology    Dg Chest 2 View  Result Date: 12/31/2018 CLINICAL DATA:  Central chest pain. EXAM: CHEST - 2 VIEW COMPARISON:  None. FINDINGS: Mild elevation of right hemidiaphragm.The cardiomediastinal contours are normal. Streaky bibasilar atelectasis. Pulmonary vasculature is normal. No consolidation, pleural effusion, or pneumothorax. No acute osseous abnormalities are seen. IMPRESSION: 1. Streaky bibasilar atelectasis. 2. Lungs otherwise clear. Electronically Signed   By: Keith Rake M.D.   On: 12/31/2018 22:10   Ir US Guide Vasc Access Right  Result Date: 01/02/2019 CLINICAL DATA:  Poor venous access  EXAM: ULTRASOUND GUIDED VENIPUNCTURE BY MD  CONTRAST:  None  COMPLICATIONS: None immediate  TECHNIQUE: The right upper arm was prepped with chlorhexidine in a sterile fashion, and a sterile drape was applied covering the operative field. Local anesthesia was provided with 1% lidocaine.  Under direct  ultrasound guidance, the right basilic vein was accessed with a micropuncture kit after the overlying soft tissues were anesthetized with 1% lidocaine. An ultrasound image was saved for documentation purposes. The micropuncture sheath easily aspirated and flushed and was secured in place. A dressing was placed. The patient tolerated the procedure well without immediate post procedural complication.  IMPRESSION: Successful ultrasound guided placement of a right basilic vein approach micropuncture sheath for temporary venous access.  Read by Candiss Norse, PA-C   Electronically Signed   By: Sandi Mariscal M.D.   On: 01/01/2019 15:58   Ct Coronary Morph W/cta Cor W/score W/ca W/cm &/or Wo/cm  Addendum Date: 01/02/2019   ADDENDUM REPORT: 01/02/2019 08:48 EXAM: OVER-READ INTERPRETATION  CT CHEST The following report is an over-read performed by radiologist Dr. Alvino Blood [facility] on 01/02/2019. This over-read does not include interpretation of cardiac or coronary anatomy or pathology. The CT a interpretation by the cardiologist is attached. COMPARISON:  None. FINDINGS: Limited view of the lung parenchyma demonstrates no suspicious nodularity. Airways are normal. Limited view of the mediastinum demonstrates no adenopathy. Esophagus normal. Limited view of the upper abdomen unremarkable. Limited view of the skeleton and chest wall is unremarkable. IMPRESSION: No significant extracardiac findings. Electronically Signed   By: Suzy Bouchard M.D.   On: 01/02/2019 08:48   Addendum Date: 01/02/2019   ADDENDUM REPORT: 01/02/2019 08:17 EXAM: CT FFR ANALYSIS CLINICAL DATA:  78 year old female with chest pain. FINDINGS: FFRct analysis was performed on the original cardiac CT angiogram dataset. Diagrammatic representation of the FFRct analysis is provided in a separate PDF document in PACS. This dictation was created using the PDF document and an interactive 3D model of the results. 3D model is  not available in the EMR/PACS. Normal FFR range is >0.80. 1. Left Main: 0.99. 2. LAD: 0.89. 3. Proximal: 1.0, distal 0.55. 4. RCA: 0.92. IMPRESSION: 1. 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 Electronically Signed   By: Ena Dawley   On: 01/02/2019 08:17   Result Date: 01/02/2019 CLINICAL DATA:  78 year old female with h/o hypertension and chest pain. EXAM: Cardiac/Coronary  CT TECHNIQUE: The patient was scanned on a [employer]. FINDINGS: A 120 kV prospective scan was triggered in the descending thoracic aorta at 111 HU's. Axial non-contrast 3 mm slices were carried out through the heart. The data set was analyzed on a dedicated work station and scored using the [place]. Gantry rotation speed was 250 msecs and collimation was .6 mm. No beta blockade and 0.8 mg of sl  NTG was given. The 3D data set was reconstructed in 5% intervals of the 67-82 % of the R-R cycle. Diastolic phases were analyzed on a dedicated work station using MPR, MIP and VRT modes. The patient received 80 cc of contrast. Aorta: Normal size. Mild diffuse calcifications and atherosclerotic plaque. No dissection. Aortic Valve:  Trileaflet.  No calcifications. Coronary Arteries:  Normal coronary origin.  Right dominance. RCA is a large dominant artery that gives rise to PDA and PLA. Proximal RCA has a moderate long calcified plaque with stenosis 50-69%. Mid RCA has mild non-calcified plaque with stenosis 25-49%. Distal RCA has only minimal plaque. PDA has minimal plaque. PLA is a very small artery. Left main is a large artery that gives rise to LAD, ramus intermedius and LCX arteries. Left main has no plaque. LAD is a moderate caliber vessel with mild calcified plaque in the proximal portion with associated stenosis 25-49%. Mid and distal LAD has no obvious plaque. Ramus intermedius is a medium size branch with moderate mixed plaque in the proximal portion  with stenosis 50-69%. LCX is a small non-dominant artery that has mild calcified plaque with stenosis 25-49%. Other findings: Normal pulmonary vein drainage into the left atrium. Normal let atrial appendage without a thrombus. Normal size of the pulmonary artery. IMPRESSION: 1. Coronary calcium score of 352. This was 42 percentile for age and sex matched control. 2. Normal coronary origin with right dominance. 3. Moderate plaque in the proximal RCA, proximal portion of ramus intermedius and mild plaque in the mid RCA and proximal LAD. CAD RADS. Additional analysis with CT FFR will be submitted. Electronically Signed: By: Ena Dawley On: 01/01/2019 17:23   Ir Radiology Peripheral Guided Iv Start  Result Date: 01/01/2019 CLINICAL DATA:  Poor venous access EXAM: ULTRASOUND GUIDED VENIPUNCTURE BY MD CONTRAST:  None COMPLICATIONS: None immediate TECHNIQUE: The right upper arm was prepped with chlorhexidine in a sterile fashion, and a sterile drape was applied covering the operative field. Local anesthesia was provided with 1% lidocaine. Under direct ultrasound guidance, the right basilic vein was accessed with a micropuncture kit after the overlying soft tissues were anesthetized with 1% lidocaine. An ultrasound image was saved for documentation purposes. The micropuncture sheath easily aspirated and flushed and was secured in place. A dressing was placed. The patient tolerated the procedure well without immediate post procedural complication. IMPRESSION: Successful ultrasound guided placement of a right basilic vein approach micropuncture sheath for temporary venous access. Read by Candiss Norse, PA-C Electronically Signed   By: Sandi Mariscal M.D.   On: 01/01/2019 15:58    Cardiac Studies   Coronary CT  Patient Profile     78 y.o. female  with a hx of HTN, COPD,  Hypothyroidism and daily alcohol use who was seem for the evaluation of chest pain at the request of Dr. Blaine Hamper. Underwent coronary CT  yesterday.    Assessment & Plan    1. Chest pain: Occurred after briefly working outside watering garden. EKG showed SR with nonspecific ST changes. Troponins negative x3. Underwent coronary CT yesterday with coronary Ca++ score of 352 with moderate plaque in the pRCA, pRI, and mild plaque in mRCA/pLAD. Sent for FFR noting->> LM 0.99, LAD 0.89, pLcx 1.0-->dLcx 0.55, RCA 0.92. Lcx noted as a nondominant Lcx vessel. Will review findings with MD.    2. Elevated LFTs: Hep panel negative. Does report ETOH use. On CIWA protocol. Further work up per primary. Will hold on adding statin at this time given above.  3. HTN: borderline controlled on current regimen.   4. COPD: stable.   5. Hypokalemia: replaced on 6/22. CMET pending this am.  For questions or updates, please contact [place] Please consult [email or web address] for contact info under    Signed, Reino Bellis, NP  01/02/2019, 8:59 AM     Agree with note by Reino Bellis NP-C  Troponins negative.  EKG shows no acute changes.  Coronary CT FFR showed a distal nondominant circumflex stenosis that appeared to be physiologically significant.  The patient had no recurrent symptoms.  At this point, I recommend medical therapy.  We can start low-dose beta-blocker.  She stable for discharge from our point of view.  We will arrange TOC 7 followed by outpatient office visit.  Her exam is benign.  Lorretta Harp, M.D., [place], [facility], [place] [address] [place], [place]  [address]  [phone] 01/02/2019 9:39 AM

## 2019-01-02 NOTE — TOC Initial Note (Signed)
Transition of Care [facility]) - Initial/Assessment Note    Patient Details  Name: Emma [name] MRN: [id number] Date of Birth: 04-May-1941  [facility] of [facility] CM/SW Contact:    Candie Chroman, LCSW Phone Number: 01/02/2019, 9:34 AM  Clinical Narrative: Patient independent from home alone. She still mows the yard, drives, etc. She is not utilizing any home health and does not use any assistive devices to get around. No TOC needs identified at this time.         Expected Discharge Plan: Home/Self Care Barriers to Discharge: Continued Medical Work up   Patient Goals and CMS Choice        Expected Discharge Plan and Services Expected Discharge Plan: Home/Self Care     Post Acute Care Choice: NA Living arrangements for the past 2 months: Single Family Home                                      Prior Living Arrangements/Services Living arrangements for the past 2 months: Single Family Home Lives with:: Self Patient language and need for interpreter reviewed:: Yes(No needs.) Do you feel safe going back to the place where you live?: Yes      Need for Family Participation in Patient Care: No (Comment) Care giver support system in place?: No (comment)(Independent from home alone.) Current home services: (None) Criminal Activity/Legal Involvement Pertinent to Current Situation/Hospitalization: No - Comment as needed  Activities of Daily Living Home Assistive Devices/Equipment: None ADL Screening (condition at time of admission) Patient's cognitive ability adequate to safely complete daily activities?: Yes Is the patient deaf or have difficulty hearing?: No Does the patient have difficulty seeing, even when wearing glasses/contacts?: No Does the patient have difficulty concentrating, remembering, or making decisions?: No Patient able to express need for assistance with ADLs?: Yes Does the patient have difficulty dressing or bathing?: No Independently performs ADLs?:  Yes (appropriate for developmental age) Does the patient have difficulty walking or climbing stairs?: No Weakness of Legs: None Weakness of Arms/Hands: None  Permission Sought/Granted   Permission granted to share information with : No              Emotional Assessment Appearance:: Appears stated age Attitude/Demeanor/Rapport: Engaged, Gracious Affect (typically observed): Accepting, Appropriate, Calm, Pleasant Orientation: : Oriented to Self, Oriented to Place, Oriented to  Time, Oriented to Situation Alcohol / Substance Use: Alcohol Use Psych Involvement: No (comment)  Admission diagnosis:  Nonspecific chest pain [R07.9] Patient Active Problem List   Diagnosis Date Noted  . Hypokalemia 12/31/2018  . Leukocytosis 12/31/2018  . Abnormal LFTs 12/31/2018  . HTN (hypertension) 12/31/2018  . Chest pain 12/31/2018  . Alcohol use 12/31/2018  . Gangrenous appendicitis 05/31/2018  . Acute gangrenous appendicitis s/p lap appendectomy 05/30/2018 05/30/2018  . 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 Acute bronchitis 04/02/2013  . COPD (chronic obstructive pulmonary disease) ([place]) 04/02/2013   PCP:  Shon Baton, MD Pharmacy:   [facility] [place], [place] [address] [name] [address] Phone: [phone] Fax: [phone]     Social Determinants of Health (SDOH) Interventions    Readmission Risk Interventions No flowsheet data found.

## 2019-01-02 NOTE — Care Management Obs Status (Signed)
[place] NOTIFICATION   Patient Details  Name: Emma [name] MRN: [id number] Date of Birth: 04-Aug-1940   Medicare Observation Status Notification Given:  Yes    Candie Chroman, LCSW 01/02/2019, 9:29 AM

## 2019-01-07 ENCOUNTER — Encounter: Payer: Self-pay | Admitting: Obstetrics and Gynecology

## 2019-01-11 ENCOUNTER — Telehealth: Payer: Self-pay | Admitting: Obstetrics and Gynecology

## 2019-01-11 NOTE — Telephone Encounter (Signed)
mychart message sent in regards to her DEXA

## 2019-01-12 DIAGNOSIS — R7401 Elevation of levels of liver transaminase levels: Secondary | ICD-10-CM | POA: Insufficient documentation

## 2019-01-15 ENCOUNTER — Encounter: Payer: Self-pay | Admitting: General Practice

## 2019-01-15 ENCOUNTER — Other Ambulatory Visit: Payer: Self-pay

## 2019-01-15 ENCOUNTER — Ambulatory Visit (INDEPENDENT_AMBULATORY_CARE_PROVIDER_SITE_OTHER): Payer: Medicare Other | Admitting: General Practice

## 2019-01-15 VITALS — BP 120/52 | HR 65 | Temp 97.0°F | Ht 60.0 in | Wt 152.0 lb

## 2019-01-15 DIAGNOSIS — R079 Chest pain, unspecified: Secondary | ICD-10-CM | POA: Diagnosis not present

## 2019-01-15 DIAGNOSIS — I10 Essential (primary) hypertension: Secondary | ICD-10-CM | POA: Diagnosis not present

## 2019-01-15 DIAGNOSIS — I2510 Atherosclerotic heart disease of native coronary artery without angina pectoris: Secondary | ICD-10-CM | POA: Diagnosis not present

## 2019-01-15 DIAGNOSIS — E876 Hypokalemia: Secondary | ICD-10-CM | POA: Diagnosis not present

## 2019-01-15 MED ORDER — METOPROLOL TARTRATE 25 MG PO TABS: 12.5000 mg | ORAL_TABLET | Freq: Two times a day (BID) | ORAL | 0 refills | Status: DC

## 2019-01-15 NOTE — Patient Instructions (Addendum)
Medication Instructions:  DECREASE Metoprolol to 12.5mg  Take 1 tablet twice a day  If you need a refill on your cardiac medications before your next appointment, please call your pharmacy.   Lab work: Have Dr [employer] office fax over a copy of your labs If you have labs (blood work) drawn today and your tests are completely normal, you will receive your results only by: Marland Kitchen MyChart Message (if you have MyChart) OR . A paper copy in the mail If you have any lab test that is abnormal or we need to change your treatment, we will call you to review the results.  Testing/Procedures: None   Follow-Up: At [facility], you and your health needs are our priority.  As part of our continuing mission to provide you with exceptional heart care, we have created designated Provider Care Teams.  These Care Teams include your primary Cardiologist (physician) and Advanced Practice Providers (APPs -  Physician Assistants and Nurse Practitioners) who all work together to provide you with the care you need, when you need it. You will need a follow up appointment in 6 months.  Please call our office 2 months in advance to schedule this appointment.  You may see Quay Burow, MD or one of the following Advanced Practice Providers on your designated Care Team:   Kerin Ransom, PA-C Roby Lofts, [place] . Sande Rives, PA-C  Any Other Special Instructions Will Be Listed Below (If Applicable).

## 2019-01-15 NOTE — Progress Notes (Addendum)
Cardiology Clinic Note   Patient Name: CHANETTE [name] Date of Encounter: 01/15/2019  Primary Care Provider:  Shon Baton, MD Primary Cardiologist:  Quay Burow, MD  Patient Profile    Emma [name] 78 year old female follows up for evaluation after being discharged from the hospital after a bout of chest pain.  Past Medical History    Past Medical History:  Diagnosis Date  . Allergy   . Asthma    as child  . 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 pt. unsure of pathology  . Carpal tunnel syndrome   . Cataract   . Condyloma   . Constipation   . Hx of adenomatous colonic polyps   . Hypertension   . Hypothyroidism    2016- synthroid d/c'd   . Osteoarthritis    OA- knee- L knee, R thumb  . Osteopenia   . Thyroid disease   . Vitamin D deficiency    Past Surgical History:  Procedure Laterality Date  . BELPHAROPTOSIS REPAIR Bilateral 04/2015   vision correction  . BREAST BIOPSY     right   . CATARACT EXTRACTION, BILATERAL Bilateral 01/2014, 02/2014   /w IOL  . CESAREAN SECTION     x 2   . CHOLECYSTECTOMY    . EYE SURGERY    . GALLBLADDER SURGERY    . IR RADIOLOGY PERIPHERAL GUIDED IV START  01/01/2019  . IR US GUIDE VASC ACCESS RIGHT  01/01/2019  . LAPAROSCOPIC APPENDECTOMY N/A 05/30/2018   Procedure: APPENDECTOMY LAPAROSCOPIC;  Surgeon: Leighton Ruff, MD;  Location: [facility];  Service: General;  Laterality: N/A;  . SHOULDER SURGERY  2006   right   . SHOULDER SURGERY Left 07/17/2013    bone spurs, and partial tear of rotator cuff  . TOTAL KNEE ARTHROPLASTY Left 08/14/2017   Procedure: LEFT TOTAL KNEE ARTHROPLASTY;  Surgeon: Vickey Huger, MD;  Location: [facility];  Service: Orthopedics;  Laterality: Left;  . TUBAL LIGATION      Allergies  Allergies  Allergen Reactions  . Penicillins Hives, Rash and Other (See Comments)    PATIENT HAS HAD A PCN REACTION WITH IMMEDIATE RASH, FACIAL/TONGUE/THROAT  SWELLING, SOB, OR LIGHTHEADEDNESS WITH HYPOTENSION:  #  #  #  YES  #  #  #   Has patient had a PCN reaction causing severe rash involving mucus membranes or skin necrosis: no Has patient had a PCN reaction that required hospitalization: no Has patient had a PCN reaction occurring within the last 10 years: #  #  #  YES  #  #  #    . Moxifloxacin Other (See Comments)    Unknown      History of Present Illness    [name] was recently discharged from the hospital (01/02/2019) after having a bout of chest pain.  She had been watering her garden when she experienced midsternal/epigastric pain.  She describes the cardiac sensation as pressure-like and radiating to her back.  She also noted diaphoresis with the episode.  Her discomfort lasted approximately 2 hours and was noted to be 8/10 intensity.  When EMS arrived at her home she was given 325 mg of aspirin and sublingual nitroglycerin x1 which resolved her pain.  She did not have recurrent episodes.  She had not had any episodes of chest pain while doing yard work prior.  She had chronic right ankle edema.  She was admitted to  the hospital and ACS was ruled out.  Troponins were negative x3.  LDL 76.  Elevated AST/ALT with normal lipase.  Coronary CT with coronary calcium score of 352 and moderate plaque in the proximal RCA, pRI, and mild plaque in the mRCA/pLAD.  Also CT FFR analysis showed a hemodynamically significant stenosis in the midportion of a nondominant LCx artery.  Statin medication held due to elevated LFTs.  She has a PMH of pretension, acute bronchitis, COPD, acute gangrenous appendicitis status post lap appendectomy, hypokalemia, leukocytosis, abnormal LFTs, chest pain, and alcohol use.  She presents in the clinic today and states that she  had taken 2 maybe 3 doses of methotrexate to help with her reaction to mosquito bites.  The next day is when she had the midsternal chest discomfort.  She states that she is back in her yard and doing  all of her normal activities climbing up and down stairs without chest pain.  She has stopped taking her folic acid and methotrexate.  She states she has also seen her PCP (01/03/2019), and will see him again 01/16/2019 to redraw labs for her elevated LFTs.  She states that she has noticed some lightheadedness throughout the day while being on metoprolol.  She denies shortness of breath, chest pain, melena, hematuria, hemoptysis, presyncope, syncope, fatigue, weakness, orthopnea and PND.  Home Medications    Prior to Admission medications   Medication Sig Start Date End Date Taking? Authorizing Provider  acetaminophen (TYLENOL) 500 MG tablet Take 500-1,900 mg by mouth every 6 (six) hours as needed for mild pain.     [provider]  aspirin EC 81 MG tablet Take 1 tablet (81 mg total) by mouth daily. 01/02/19 01/02/20  Domenic Polite, MD  cetaphil (CETAPHIL) cream Apply 1 application topically daily as needed (dry skin).    [provider]  cholecalciferol (VITAMIN D) 1000 units tablet Take 1,000 Units by mouth See admin instructions. Take 1 tablet daily except Sundays.    [provider]  folic acid (FOLVITE) 1 MG tablet Take 1 mg by mouth daily.    [provider]  furosemide (LASIX) 20 MG tablet Take 20 mg by mouth every Monday, Wednesday, and Friday. 12/18/18   [provider]  gabapentin (NEURONTIN) 300 MG capsule Take 300 mg by mouth at bedtime.  10/27/10   [provider]  ibuprofen (ADVIL,MOTRIN) 200 MG tablet Take 200-400 mg by mouth every 6 (six) hours as needed for fever, headache or mild pain.     [provider]  KLOR-CON M20 20 MEQ tablet Take 20 mEq by mouth 2 (two) times daily.  12/01/10   [provider]  losartan-hydrochlorothiazide (HYZAAR) 100-12.5 MG tablet Take 1 tablet by mouth daily. 12/05/17   [provider]  methotrexate (RHEUMATREX) 2.5 MG tablet Take 2.5-5 mg by mouth See admin instructions. Takes  one or two tablets once a week on Sundays. Takes for psoriasis/itching. Dose depends on itching. 12/01/10   [provider]  metoprolol tartrate (LOPRESSOR) 25 MG tablet Take 1 tablet (25 mg total) by mouth 2 (two) times daily. 01/02/19   Domenic Polite, MD  montelukast (SINGULAIR) 10 MG tablet Take 10 mg by mouth at bedtime.    [provider]  Propylene Glycol (SYSTANE BALANCE OP) Place 1 drop into both eyes 2 (two) times daily as needed (dry eyes).     [provider]  Vitamin D, Ergocalciferol, (DRISDOL) 50000 UNITS CAPS Take 50,000 Units by mouth every Sunday.  10/08/10   [provider]  Grant Ruts INHUB 250-50 MCG/DOSE AEPB Inhale 1 puff into the lungs daily. 11/17/18   [provider]    Family History    Family History  Problem Relation Age of Onset  . Heart disease Father   . Diabetes Paternal Grandfather   . Asthma Son   . Colon cancer Neg Hx    She indicated that her mother is deceased. She indicated that her father is deceased. She indicated that all of her three brothers are alive. She indicated that the status of her paternal grandfather is unknown. She indicated that the status of her son is unknown. She indicated that the status of her neg hx is unknown.  Social History    Social History   Socioeconomic History  . Marital status: Divorced    Spouse name: Not on file  . Number of children: 2  . Years of education: Not on file  . Highest education level: Not on file  Occupational History  . Occupation: Retired  Scientific laboratory technician  . Financial resource strain: Not on file  . Food insecurity    Worry: Not on file    Inability: Not on file  . Transportation needs    Medical: Not on file    Non-medical: Not on file  Tobacco Use  . Smoking status: Former Smoker    Packs/day: 0.25    Years: 50.00    Pack years: 12.50    Types: Cigarettes    Quit date: 08/11/2017    Years since quitting: 1.4  . Smokeless tobacco: Never Used   Substance and Sexual Activity  . Alcohol use: Yes    Alcohol/week: 7.0 standard drinks    Types: 7 Standard drinks or equivalent per week  . Drug use: No  . Sexual activity: Not Currently    Partners: Male    Birth control/protection: Post-menopausal  Lifestyle  . Physical activity    Days per week: Not on file    Minutes per session: Not on file  . Stress: Not on file  Relationships  . Social Herbalist on phone: Not on file    Gets together: Not on file    Attends religious service: Not on file    Active member of club or organization: Not on file    Attends meetings of clubs or organizations: Not on file    Relationship status: Not on file  . Intimate partner violence    Fear of current or ex partner: Not on file    Emotionally abused: Not on file    Physically abused: Not on file    Forced sexual activity: Not on file  Other Topics Concern  . Not on file  Social History Narrative   2 caffeine drinks daily      Review of Systems    General:  No chills, fever, night sweats or weight changes.  Cardiovascular:  No chest pain, dyspnea on exertion, edema, orthopnea, palpitations, paroxysmal nocturnal dyspnea. Dermatological: No rash, lesions/masses Respiratory: No cough, dyspnea Urologic: No hematuria, dysuria Abdominal:   No nausea, vomiting, diarrhea, bright red blood per rectum, melena, or hematemesis Neurologic:  No visual changes, wkns, changes in mental status. All other systems reviewed and are otherwise negative except as noted above.  Physical Exam    VS:  LMP 12/09/1997 (Approximate)  , BMI There is no height or weight on file to calculate BMI. GEN: Well nourished, well developed, in no acute distress. HEENT: normal.  Neck: Supple, no JVD, carotid bruits, or masses. Cardiac: RRR, no murmurs, rubs, or gallops. No clubbing, cyanosis, edema.  Radials/DP/PT 2+ and equal bilaterally.  Respiratory:  Respirations regular and unlabored, clear to  auscultation bilaterally. GI: Soft, nontender, nondistended, BS + x 4. MS: no deformity or atrophy. Skin: warm and dry, no rash. Neuro:  Strength and sensation are intact. Psych: Normal affect.  Accessory Clinical Findings    ECG personally reviewed by me today- None today.  EKG 01/01/2019: Normal sinus rhythm 83 bpm  EKG 12/31/2018: Sinus rhythm 90 bpm Assessment & Plan   1.  Chest pain- none today Low-sodium heart healthy diet Increase physical activity as tolerated  2.  Coronary artery disease- CTA (01/01/2019)- hemodynamically significant stenosis in the midportion of a nondominant LCx artery.  No chest pain Medical management  3.  Hypertension- well-controlled Reduce metoprolol tartrate to 12 and half mg twice daily Continue Hyzaar 100-12.5 mg daily   4.  Hypokalemia- potassium 4.4 (CMP 01/02/2019) Continue potassium chloride ER 20 MEQ twice daily Monitored by PCP Obtain lab results from PCP next appointment (01/16/2019)  Disposition: Follow-up with Dr. Gwenlyn Found in 6 months.  Deberah Pelton, NP 01/15/2019, 12:45 PM

## 2019-01-17 ENCOUNTER — Encounter: Payer: Self-pay | Admitting: General Practice

## 2019-01-24 ENCOUNTER — Telehealth: Payer: Self-pay

## 2019-01-24 NOTE — Telephone Encounter (Signed)
Contacted patients pcp to see if they could fax over the patients most recent lab results. Left message for medical records to send over labs to our back fax.

## 2019-03-14 ENCOUNTER — Other Ambulatory Visit: Payer: Self-pay | Admitting: General Practice

## 2019-03-15 ENCOUNTER — Telehealth: Payer: Self-pay | Admitting: Cardiovascular Disease

## 2019-03-15 NOTE — Telephone Encounter (Signed)
Returned call to pt she states that her BP has been low. She does not have a BP cuff. She states that she went to the doctors office yesterday and her BP was 114/45 she states that they had her drink a glass of water and it came up to 117/56. Informed pt to eat some salt (just for today) and make sure to stay hydrated with at least 70 ox of water daily. Verbalizes understanding

## 2019-03-15 NOTE — Telephone Encounter (Signed)
New message   Pt c/o medication issue:  1. Name of Medication: metoprolol tartrate (LOPRESSOR) 25 MG tablet  2. How are you currently taking this medication (dosage and times per day)? 1/2 pill in morning and afternoon  3. Are you having a reaction (difficulty breathing--STAT)? No   4. What is your medication issue? Patient states that her b/p is low. Patient wants to know if she should be taking this medication.

## 2019-07-16 ENCOUNTER — Ambulatory Visit: Payer: Medicare Other | Admitting: Gastroenterology

## 2019-07-16 ENCOUNTER — Encounter: Payer: Self-pay | Admitting: Cardiovascular Disease

## 2019-07-16 ENCOUNTER — Ambulatory Visit: Payer: Medicare PPO | Admitting: Cardiovascular Disease

## 2019-07-16 ENCOUNTER — Other Ambulatory Visit: Payer: Self-pay

## 2019-07-16 VITALS — BP 124/65 | HR 77 | Temp 97.2°F | Ht 61.0 in | Wt 154.4 lb

## 2019-07-16 DIAGNOSIS — I10 Essential (primary) hypertension: Secondary | ICD-10-CM

## 2019-07-16 DIAGNOSIS — R079 Chest pain, unspecified: Secondary | ICD-10-CM

## 2019-07-16 MED ORDER — METOPROLOL TARTRATE 25 MG PO TABS: ORAL_TABLET | ORAL | 3 refills | Status: DC

## 2019-07-16 NOTE — Assessment & Plan Note (Signed)
History of essential potential blood pressure measured today at 124/65.  She is on losartan, hydrochlorothiazide and metoprolol.

## 2019-07-16 NOTE — Progress Notes (Signed)
07/16/2019 Emma [name]   [birth year]  [id number]  Primary Physician Shon Baton, MD Primary Cardiologist: Lorretta Harp MD FACP, [place], [place], [place]  HPI:  Emma [name] is a 79 y.o. moderately overweight divorced Caucasian female mother of 2 children, grandmother of 5 grandchildren who I initially saw during her hospitalization this past summer for chest pain.  Her primary provider is [name].  She did see Coletta Memos, FNP in the office 01/15/2019, 1 to 2 weeks post discharge.  Risk factors include tobacco abuse having quit 2 years ago as well as treated hypertension.  Her father may have had a myocardial infarction.  She is never had a heart attack or stroke.  She is retired Airline pilot.  She did have a coronary CTA during her hospitalization 12/31/2018 that showed a significant mid nondominant circumflex lesion.  She said no recurrent chest pain.   Current Meds  Medication Sig  . acetaminophen (TYLENOL) 500 MG tablet Take 500-1,900 mg by mouth every 6 (six) hours as needed for mild pain.   Marland Kitchen aspirin EC 81 MG tablet Take 1 tablet (81 mg total) by mouth daily.  . cetaphil (CETAPHIL) cream Apply 1 application topically daily as needed (dry skin).  . cholecalciferol (VITAMIN D) 1000 units tablet Take 1,000 Units by mouth See admin instructions. Take 1 tablet daily except Sundays.  . folic acid (FOLVITE) 1 MG tablet Take 1 mg by mouth daily.  . furosemide (LASIX) 20 MG tablet Take 20 mg by mouth every Monday, Wednesday, and Friday.  . gabapentin (NEURONTIN) 300 MG capsule Take 300 mg by mouth at bedtime.   Marland Kitchen ibuprofen (ADVIL,MOTRIN) 200 MG tablet Take 200-400 mg by mouth every 6 (six) hours as needed for fever, headache or mild pain.   Marland Kitchen KLOR-CON M20 20 MEQ tablet Take 20 mEq by mouth 2 (two) times daily.   Marland Kitchen losartan-hydrochlorothiazide (HYZAAR) 100-12.5 MG tablet Take 1 tablet by mouth daily.  . methotrexate (RHEUMATREX) 2.5 MG tablet Take 2 tablets by mouth daily.    . metoprolol tartrate (LOPRESSOR) 25 MG tablet TAKE 1/2 TABLET(12.5 MG) BY MOUTH TWICE DAILY  . montelukast (SINGULAIR) 10 MG tablet Take 10 mg by mouth at bedtime.  Marland Kitchen Propylene Glycol (SYSTANE BALANCE OP) Place 1 drop into both eyes 2 (two) times daily as needed (dry eyes).   . Vitamin D, Ergocalciferol, (DRISDOL) 50000 UNITS CAPS Take 50,000 Units by mouth every Sunday.   Grant Ruts INHUB 250-50 MCG/DOSE AEPB Inhale 1 puff into the lungs daily.     Allergies  Allergen Reactions  . Penicillins Hives, Rash and Other (See Comments)    PATIENT HAS HAD A PCN REACTION WITH IMMEDIATE RASH, FACIAL/TONGUE/THROAT SWELLING, SOB, OR LIGHTHEADEDNESS WITH HYPOTENSION:  #  #  #  YES  #  #  #   Has patient had a PCN reaction causing severe rash involving mucus membranes or skin necrosis: no Has patient had a PCN reaction that required hospitalization: no Has patient had a PCN reaction occurring within the last 10 years: #  #  #  YES  #  #  #    . Moxifloxacin Other (See Comments)    Unknown      Social History   Socioeconomic History  . Marital status: Divorced    Spouse name: Not on file  . Number of children: 2  . Years of education: Not on file  . Highest education level: Not on file  Occupational  History  . Occupation: Retired  Tobacco Use  . Smoking status: Former Smoker    Packs/day: 0.25    Years: 50.00    Pack years: 12.50    Types: Cigarettes    Quit date: 08/11/2017    Years since quitting: 1.9  . Smokeless tobacco: Never Used  Substance and Sexual Activity  . Alcohol use: Yes    Alcohol/week: 7.0 standard drinks    Types: 7 Standard drinks or equivalent per week  . Drug use: No  . Sexual activity: Not Currently    Partners: Male    Birth control/protection: Post-menopausal  Other Topics Concern  . Not on file  Social History Narrative   2 caffeine drinks daily    Social Determinants of Health   Financial Resource Strain:   . Difficulty of Paying Living Expenses: Not  on file  Food Insecurity:   . Worried About Charity fundraiser in the Last Year: Not on file  . Ran Out of Food in the Last Year: Not on file  Transportation Needs:   . Lack of Transportation (Medical): Not on file  . Lack of Transportation (Non-Medical): Not on file  Physical Activity:   . Days of Exercise per Week: Not on file  . Minutes of Exercise per Session: Not on file  Stress:   . Feeling of Stress : Not on file  Social Connections:   . Frequency of Communication with Friends and Family: Not on file  . Frequency of Social Gatherings with Friends and Family: Not on file  . Attends Religious Services: Not on file  . Active Member of Clubs or Organizations: Not on file  . Attends Archivist Meetings: Not on file  . Marital Status: Not on file  Intimate Partner Violence:   . Fear of Current or Ex-Partner: Not on file  . Emotionally Abused: Not on file  . Physically Abused: Not on file  . Sexually Abused: Not on file     Review of Systems: General: negative for chills, fever, night sweats or weight changes.  Cardiovascular: negative for chest pain, dyspnea on exertion, edema, orthopnea, palpitations, paroxysmal nocturnal dyspnea or shortness of breath Dermatological: negative for rash Respiratory: negative for cough or wheezing Urologic: negative for hematuria Abdominal: negative for nausea, vomiting, diarrhea, bright red blood per rectum, melena, or hematemesis Neurologic: negative for visual changes, syncope, or dizziness All other systems reviewed and are otherwise negative except as noted above.    Blood pressure 124/65, pulse 77, temperature (!) 97.2 F (36.2 C), height 5\' 1"  (1.549 m), weight 154 lb 6.4 oz (70 kg), last menstrual period 12/09/1997, SpO2 99 %.  General appearance: alert and no distress Neck: no adenopathy, no carotid bruit, no JVD, supple, symmetrical, trachea midline and thyroid not enlarged, symmetric, no tenderness/mass/nodules Lungs:  clear to auscultation bilaterally Heart: regular rate and rhythm, S1, S2 normal, no murmur, click, rub or gallop Extremities: extremities normal, atraumatic, no cyanosis or edema Pulses: 2+ and symmetric Skin: Skin color, texture, turgor normal. No rashes or lesions Neurologic: Alert and oriented X 3, normal strength and tone. Normal symmetric reflexes. Normal coordination and gait  EKG sinus rhythm at 77 with occasional PAC.  I personally reviewed this EKG.  ASSESSMENT AND PLAN:   HTN (hypertension) History of essential potential blood pressure measured today at 124/65.  She is on losartan, hydrochlorothiazide and metoprolol.  Chest pain with moderate risk for cardiac etiology Patient was seen 01/01/2019 in consultation the hospital for chest  pain.  Enzymes were negative.  The coronary CTA did show significant lesion in the mid nondominant circumflex.  She has had no recurrent symptoms.      Lorretta Harp MD FACP,FACC,FAHA, [facility] 07/16/2019 2:42 PM

## 2019-07-16 NOTE — Assessment & Plan Note (Signed)
Patient was seen 01/01/2019 in consultation the hospital for chest pain.  Enzymes were negative.  The coronary CTA did show significant lesion in the mid nondominant circumflex.  She has had no recurrent symptoms.

## 2019-07-16 NOTE — Patient Instructions (Signed)
Medication Instructions:  Your physician recommends that you continue on your current medications as directed. Please refer to the Current Medication list given to you today.  If you need a refill on your cardiac medications before your next appointment, please call your pharmacy.   Lab work: NONE  Testing/Procedures: NONE  Follow-Up: At [facility], you and your health needs are our priority.  As part of our continuing mission to provide you with exceptional heart care, we have created designated Provider Care Teams.  These Care Teams include your primary Cardiologist (physician) and Advanced Practice Providers (APPs -  Physician Assistants and Nurse Practitioners) who all work together to provide you with the care you need, when you need it. You may see Quay Burow, MD or one of the following Advanced Practice Providers on your designated Care Team:    Kerin Ransom, PA-C  [place], [place]  Coletta Memos, [place]  Your physician wants you to follow-up in: 1 year with Dr Gwenlyn Found.

## 2019-07-17 ENCOUNTER — Other Ambulatory Visit: Payer: Self-pay | Admitting: General Practice

## 2019-07-19 ENCOUNTER — Ambulatory Visit: Payer: Medicare PPO | Admitting: Gastroenterology

## 2019-07-19 ENCOUNTER — Encounter: Payer: Self-pay | Admitting: Gastroenterology

## 2019-07-19 ENCOUNTER — Other Ambulatory Visit: Payer: Self-pay

## 2019-07-19 VITALS — BP 106/62 | HR 64 | Temp 97.0°F | Ht 61.0 in | Wt 155.2 lb

## 2019-07-19 DIAGNOSIS — R198 Other specified symptoms and signs involving the digestive system and abdomen: Secondary | ICD-10-CM

## 2019-07-19 DIAGNOSIS — L290 Pruritus ani: Secondary | ICD-10-CM | POA: Diagnosis not present

## 2019-07-19 DIAGNOSIS — R151 Fecal smearing: Secondary | ICD-10-CM | POA: Diagnosis not present

## 2019-07-19 NOTE — Progress Notes (Signed)
[facility] Consult Note:  History: Emma [name] 07/19/2019  Referring provider: Shon Baton, MD  Reason for consult/chief complaint: change in bowel habits (x 1 year  ) and Anal Itching (after having bowel movement)   Subjective  HPI: Last seen in office October 2017 for heme positive stool done by primary care 2 years after a colonoscopy by Dr. Olevia Perches with just hyperplastic polyp.  Asymptomatic with normal hemoglobin.  Patient had a clinic visit with Dr. Olevia Perches in May 2012 again for heme positive stool, having had a colonoscopy with just 2 subcentimeter tubular adenomas in 2010.  Saw primary care in November complaining of intermittent loose stool and fecal incontinence.  Apparently this has been noticeable since her November 2019 appendectomy, which required drainage due to appendiceal perforation and abscess.  No colonic or ileal resection was necessary.  (Dr. Manon Hilding operative report was reviewed)  Emma [name] reports about a year of a change in bowel habits where the stool is mostly formed once or twice a day, though sometimes in small pieces. That happens, she will have to clean frequently after bowel movements and then might still have some fecal smearing and underpants. This causes some perianal itching. She denies rectal bleeding, abdominal pain, nausea or vomiting.   ROS:  Review of Systems  Constitutional: Negative for appetite change and unexpected weight change.  HENT: Negative for mouth sores and voice change.   Eyes: Negative for pain and redness.  Respiratory: Negative for cough and shortness of breath.   Cardiovascular: Negative for chest pain and palpitations.  Genitourinary: Negative for dysuria and hematuria.  Musculoskeletal: Positive for arthralgias. Negative for myalgias.  Skin: Negative for pallor and rash.  Neurological: Negative for weakness and headaches.  Hematological: Negative for adenopathy.     Past Medical History: Past  Medical History:  Diagnosis Date  . Allergy   . Asthma    as child  . 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 pt. unsure of pathology  . Carpal tunnel syndrome   . Cataract   . Condyloma   . Constipation   . Hx of adenomatous colonic polyps   . Hypertension   . Hypothyroidism    2016- synthroid d/c'd   . Osteoarthritis    OA- knee- L knee, R thumb  . Osteopenia   . Thyroid disease   . Vitamin D deficiency      Past Surgical History: Past Surgical History:  Procedure Laterality Date  . BELPHAROPTOSIS REPAIR Bilateral 04/2015   vision correction  . BREAST BIOPSY     right   . CATARACT EXTRACTION, BILATERAL Bilateral 01/2014, 02/2014   /w IOL  . CESAREAN SECTION     x 2   . CHOLECYSTECTOMY    . EYE SURGERY    . GALLBLADDER SURGERY    . IR RADIOLOGY PERIPHERAL GUIDED IV START  01/01/2019  . IR US GUIDE VASC ACCESS RIGHT  01/01/2019  . LAPAROSCOPIC APPENDECTOMY N/A 05/30/2018   Procedure: APPENDECTOMY LAPAROSCOPIC;  Surgeon: Leighton Ruff, MD;  Location: [facility];  Service: General;  Laterality: N/A;  . SHOULDER SURGERY  2006   right   . SHOULDER SURGERY Left 07/17/2013    bone spurs, and partial tear of rotator cuff  . TOTAL KNEE ARTHROPLASTY Left 08/14/2017   Procedure: LEFT TOTAL KNEE ARTHROPLASTY;  Surgeon: Vickey Huger, MD;  Location: [facility];  Service: Orthopedics;  Laterality: Left;  .  TUBAL LIGATION       Family History: Family History  Problem Relation Age of Onset  . Heart disease Father   . Diabetes Paternal Grandfather   . Asthma Son   . Colon cancer Neg Hx     Social History: Social History   Socioeconomic History  . Marital status: Divorced    Spouse name: Not on file  . Number of children: 2  . Years of education: Not on file  . Highest education level: Not on file  Occupational History  . Occupation: Retired  Tobacco Use  . Smoking status: Former Smoker    Packs/day: 0.25     Years: 50.00    Pack years: 12.50    Types: Cigarettes    Quit date: 08/11/2017    Years since quitting: 1.9  . Smokeless tobacco: Never Used  Substance and Sexual Activity  . Alcohol use: Yes    Alcohol/week: 2.0 standard drinks    Types: 2 Standard drinks or equivalent per week  . Drug use: No  . Sexual activity: Not Currently    Partners: Male    Birth control/protection: Post-menopausal  Other Topics Concern  . Not on file  Social History Narrative   2 caffeine drinks daily    Social Determinants of Health   Financial Resource Strain:   . Difficulty of Paying Living Expenses: Not on file  Food Insecurity:   . Worried About Charity fundraiser in the Last Year: Not on file  . Ran Out of Food in the Last Year: Not on file  Transportation Needs:   . Lack of Transportation (Medical): Not on file  . Lack of Transportation (Non-Medical): Not on file  Physical Activity:   . Days of Exercise per Week: Not on file  . Minutes of Exercise per Session: Not on file  Stress:   . Feeling of Stress : Not on file  Social Connections:   . Frequency of Communication with Friends and Family: Not on file  . Frequency of Social Gatherings with Friends and Family: Not on file  . Attends Religious Services: Not on file  . Active Member of Clubs or Organizations: Not on file  . Attends Archivist Meetings: Not on file  . Marital Status: Not on file    Allergies: Allergies  Allergen Reactions  . Penicillins Hives, Rash and Other (See Comments)    PATIENT HAS HAD A PCN REACTION WITH IMMEDIATE RASH, FACIAL/TONGUE/THROAT SWELLING, SOB, OR LIGHTHEADEDNESS WITH HYPOTENSION:  #  #  #  YES  #  #  #   Has patient had a PCN reaction causing severe rash involving mucus membranes or skin necrosis: no Has patient had a PCN reaction that required hospitalization: no Has patient had a PCN reaction occurring within the last 10 years: #  #  #  YES  #  #  #    . Moxifloxacin Other (See Comments)     Unknown      Outpatient Meds: Current Outpatient Medications  Medication Sig Dispense Refill  . acetaminophen (TYLENOL) 500 MG tablet Take 500-1,900 mg by mouth every 6 (six) hours as needed for mild pain.     Marland Kitchen aspirin EC 81 MG tablet Take 1 tablet (81 mg total) by mouth daily.    . cetaphil (CETAPHIL) cream Apply 1 application topically daily as needed (dry skin).    . cholecalciferol (VITAMIN D) 1000 units tablet Take 1,000 Units by mouth See admin instructions. Take 1 tablet  daily except Sundays.    . folic acid (FOLVITE) 1 MG tablet Take 1 mg by mouth daily.    . furosemide (LASIX) 20 MG tablet Take 20 mg by mouth every Monday, Wednesday, and Friday. As needed    . gabapentin (NEURONTIN) 300 MG capsule Take 300 mg by mouth at bedtime.     Marland Kitchen ibuprofen (ADVIL,MOTRIN) 200 MG tablet Take 200-400 mg by mouth every 6 (six) hours as needed for fever, headache or mild pain.     Marland Kitchen KLOR-CON M20 20 MEQ tablet Take 20 mEq by mouth 2 (two) times daily.     Marland Kitchen losartan-hydrochlorothiazide (HYZAAR) 100-12.5 MG tablet Take 1 tablet by mouth daily.  5  . methotrexate (RHEUMATREX) 2.5 MG tablet Take 2 tablets by mouth daily.    . metoprolol tartrate (LOPRESSOR) 25 MG tablet TAKE 1/2 TABLET(12.5 MG) BY MOUTH TWICE DAILY 30 tablet 3  . montelukast (SINGULAIR) 10 MG tablet Take 10 mg by mouth at bedtime.    Marland Kitchen Propylene Glycol (SYSTANE BALANCE OP) Place 1 drop into both eyes 2 (two) times daily as needed (dry eyes).     . Vitamin D, Ergocalciferol, (DRISDOL) 50000 UNITS CAPS Take 50,000 Units by mouth every Sunday.     Grant Ruts INHUB 250-50 MCG/DOSE AEPB Inhale 1 puff into the lungs daily.     No current facility-administered medications for this visit.      ___________________________________________________________________ Objective   Exam:  BP 106/62 (BP Location: Right Arm, Patient Position: Sitting, Cuff Size: Normal)   Pulse 64   Temp (!) 97 F (36.1 C)   Ht 5\' 1"  (1.549 m)   Wt 155 lb  4 oz (70.4 kg)   LMP 12/09/1997 (Approximate)   SpO2 99%   BMI 29.33 kg/m    General: Well-appearing  Eyes: sclera anicteric, no redness  ENT: oral mucosa moist without lesions, no cervical or supraclavicular lymphadenopathy  CV: RRR without murmur, S1/S2, no JVD, no peripheral edema  Resp: clear to auscultation bilaterally, normal RR and effort noted  GI: soft, no tenderness, with active bowel sounds. No guarding or palpable organomegaly noted.  Skin; warm and dry, no rash or jaundice noted  Neuro: awake, alert and oriented x 3. Normal gross motor function and fluent speech Rectal: Normal perianal exam. Skin looks healthy. DRE reveals mild decrease in resting sphincter tone, good voluntary sphincter tone and puborectalis contraction. Formed stool in rectal vault, no palpable mass.   Assessment: Encounter Diagnoses  Name Primary?  . Change in bowel function Yes  . Pruritus ani   . Fecal smearing     Benign sounding change in bowel habits, underlying diverticulosis, age-related decline in anorectal motility. Fecal incontinence as a result. This is likely causing perianal skin irritation, no dermatologic disease seen there on exam today.  Plan:  Recommend daily fiber supplement for consistent improvement in stool form Flushable moist toilet wipes to decrease perianal skin irritation. See me as needed  Thank you for the courtesy of this consult.  Please call me with any questions or concerns.  Nelida Meuse III  CC: Referring provider noted above

## 2019-07-19 NOTE — Patient Instructions (Signed)
If you are age 80 or older, your body mass index should be between 23-30. Your Body mass index is 29.33 kg/m. If this is out of the aforementioned range listed, please consider follow up with your Primary Care Provider.  If you are age 30 or younger, your body mass index should be between 19-25. Your Body mass index is 29.33 kg/m. If this is out of the aformentioned range listed, please consider follow up with your Primary Care Provider.   1 tablespoon of Citrucel fiber daily in water or juice.  Pick up some flush-able toilet wipes over the counter.   Follow up as needed.  It was a pleasure to see you today!  Dr. Loletha Carrow

## 2019-07-22 ENCOUNTER — Ambulatory Visit: Payer: Medicare Other | Attending: Internal Medicine

## 2019-07-22 DIAGNOSIS — Z23 Encounter for immunization: Secondary | ICD-10-CM | POA: Insufficient documentation

## 2019-07-22 NOTE — Progress Notes (Signed)
   Covid-19 Vaccination Clinic  Name:  Emma [name]    MRN: [id number] DOB: August 28, 1940  07/22/2019  [name] was observed post Covid-19 immunization for 15 minutes without incidence. She was provided with Vaccine Information Sheet and instruction to access the V-Safe system.   [name] was instructed to call 911 with any severe reactions post vaccine: Marland Kitchen Difficulty breathing  . Swelling of your face and throat  . A fast heartbeat  . A bad rash all over your body  . Dizziness and weakness

## 2019-08-11 ENCOUNTER — Ambulatory Visit: Payer: Medicare PPO | Attending: Internal Medicine

## 2019-08-11 DIAGNOSIS — Z23 Encounter for immunization: Secondary | ICD-10-CM | POA: Insufficient documentation

## 2019-08-11 NOTE — Progress Notes (Signed)
   Covid-19 Vaccination Clinic  Name:  Emma [name]    MRN: [id number] DOB: 1941/06/26  08/11/2019  [name] was observed post Covid-19 immunization for 15 minutes without incidence. She was provided with Vaccine Information Sheet and instruction to access the V-Safe system.   [name] was instructed to call 911 with any severe reactions post vaccine: Marland Kitchen Difficulty breathing  . Swelling of your face and throat  . A fast heartbeat  . A bad rash all over your body  . Dizziness and weakness    Immunizations Administered    Name Date Dose VIS Date Route   Pfizer COVID-19 Vaccine 08/11/2019 10:11 AM 0.3 mL 06/21/2019 Intramuscular   Manufacturer: [place]   Lot: [id number]   [place]: [id number]

## 2019-11-18 ENCOUNTER — Other Ambulatory Visit: Payer: Self-pay | Admitting: Cardiovascular Disease

## 2019-12-02 ENCOUNTER — Ambulatory Visit: Payer: Medicare Other | Admitting: Obstetrics and Gynecology

## 2019-12-04 NOTE — Progress Notes (Signed)
79 y.o. G64P2002 Divorced White or Caucasian [name] here for annual exam.  No vaginal bleeding.   She c/o fecal staining. Doesn't leak any solid stool. She has a BM every day, soft to normal, not hard. She tried Citrucel and it caused diarrhea.     H/O GSI, tolerable, wears a mini-pad if she goes out.   Patient's last menstrual period was 12/09/1997 (approximate).          Sexually active: No.  The current method of family planning is post menopausal status.    Exercising: Yes.    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 (she was on Evista many years ago) Colonoscopy: 01-29-14 polyps, was told no more colonoscopies. TDaP:  2016 Gardasil: N/A   reports that she quit smoking about 2 years ago. Her smoking use included cigarettes. She has a 12.50 pack-year smoking history. She has never used smokeless tobacco. She reports current alcohol use of about 2.0 standard drinks of alcohol per week. She reports that she does not use drugs. 2 kids, both local. 5 grand children.   Past Medical History:  Diagnosis Date  . Allergy   . Asthma    as child  . 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 pt. unsure of pathology  . Carpal tunnel syndrome   . Cataract   . Condyloma   . Constipation   . Hx of adenomatous colonic polyps   . Hypertension   . Hypothyroidism    2016- synthroid d/c'd   . Osteoarthritis    OA- knee- L knee, R thumb  . Osteopenia   . Thyroid disease   . Vitamin D deficiency     Past Surgical History:  Procedure Laterality Date  . BELPHAROPTOSIS REPAIR Bilateral 04/2015   vision correction  . BREAST BIOPSY     right   . CATARACT EXTRACTION, BILATERAL Bilateral 01/2014, 02/2014   /w IOL   . CESAREAN SECTION     x 2   . CHOLECYSTECTOMY    . EYE SURGERY    . GALLBLADDER SURGERY    . IR RADIOLOGY PERIPHERAL GUIDED IV START  01/01/2019  . IR US GUIDE VASC ACCESS RIGHT  01/01/2019  . LAPAROSCOPIC APPENDECTOMY N/A 05/30/2018   Procedure: APPENDECTOMY LAPAROSCOPIC;  Surgeon: Leighton Ruff, MD;  Location: [facility];  Service: General;  Laterality: N/A;  . SHOULDER SURGERY  2006   right   . SHOULDER SURGERY Left 07/17/2013    bone spurs, and partial tear of rotator cuff  . TOTAL KNEE ARTHROPLASTY Left 08/14/2017   Procedure: LEFT TOTAL KNEE ARTHROPLASTY;  Surgeon: Vickey Huger, MD;  Location: [place];  Service: Orthopedics;  Laterality: Left;  . TUBAL LIGATION      Current Outpatient Medications  Medication Sig Dispense Refill  . acetaminophen (TYLENOL) 500 MG tablet Take 500-1,900 mg by mouth every 6 (six) hours as needed for mild pain.     Marland Kitchen aspirin EC 81 MG tablet Take 1 tablet (81 mg total) by mouth daily.    . cetaphil (CETAPHIL) cream Apply 1 application topically daily as needed (dry skin).    . cholecalciferol (VITAMIN D) 1000 units tablet Take 1,000 Units  by mouth See admin instructions. Take 1 tablet daily except Sundays.    . folic acid (FOLVITE) 1 MG tablet Take 1 mg by mouth daily.    . furosemide (LASIX) 20 MG tablet Take 20 mg by mouth every Monday, Wednesday, and Friday. As needed    . gabapentin (NEURONTIN) 300 MG capsule Take 300 mg by mouth at bedtime.     Marland Kitchen ibuprofen (ADVIL,MOTRIN) 200 MG tablet Take 200-400 mg by mouth every 6 (six) hours as needed for fever, headache or mild pain.     Marland Kitchen KLOR-CON M20 20 MEQ tablet Take 20 mEq by mouth 2 (two) times daily.     Marland Kitchen losartan-hydrochlorothiazide (HYZAAR) 100-12.5 MG tablet Take 1 tablet by mouth daily.  5  . methotrexate (RHEUMATREX) 2.5 MG tablet Take 2 tablets by mouth daily.    . metoprolol tartrate (LOPRESSOR) 25 MG tablet TAKE 1/2 TABLET(12.5 MG) BY MOUTH TWICE DAILY 30 tablet 11  . montelukast (SINGULAIR) 10 MG  tablet Take 10 mg by mouth at bedtime.    Marland Kitchen Propylene Glycol (SYSTANE BALANCE OP) Place 1 drop into both eyes 2 (two) times daily as needed (dry eyes).     . Vitamin D, Ergocalciferol, (DRISDOL) 50000 UNITS CAPS Take 50,000 Units by mouth every Sunday.     Grant Ruts INHUB 250-50 MCG/DOSE AEPB Inhale 1 puff into the lungs daily.     No current facility-administered medications for this visit.    Family History  Problem Relation Age of Onset  . Heart disease Father   . Diabetes Paternal Grandfather   . Asthma Son   . Colon cancer Neg Hx     Review of Systems  Constitutional: Negative.   HENT: Negative.   Eyes: Negative.   Respiratory: Negative.   Cardiovascular: Negative.   Gastrointestinal: Negative.   Endocrine: Negative.   Genitourinary: Negative.   Musculoskeletal: Negative.   Skin: Negative.   Allergic/Immunologic: Negative.   Neurological: Negative.   Hematological: Negative.   Psychiatric/Behavioral: Negative.     Exam:   LMP 12/09/1997 (Approximate)   Weight change: @WEIGHTCHANGE @ Height:      Ht Readings from Last 3 Encounters:  07/19/19 5\' 1"  (1.549 m)  07/16/19 5\' 1"  (1.549 m)  01/15/19 5' (1.524 m)    General appearance: alert, cooperative and appears stated age Head: Normocephalic, without obvious abnormality, atraumatic Neck: no adenopathy, supple, symmetrical, trachea midline and thyroid normal to inspection and palpation Lungs: clear to auscultation bilaterally Cardiovascular: regular rate and rhythm Breasts: normal appearance, no masses or tenderness Abdomen: soft, non-tender; non distended,  no masses,  no organomegaly Extremities: extremities normal, atraumatic, no cyanosis or edema Skin: Skin color, texture, turgor normal. No rashes or lesions Lymph nodes: Cervical, supraclavicular, and axillary nodes normal. No abnormal inguinal nodes palpated Neurologic: Grossly normal   Pelvic: External genitalia:  no lesions              Urethra:  normal  appearing urethra with no masses, tenderness or lesions              Bartholins and Skenes: normal                 Vagina: atrophic appearing vagina with normal color and discharge, no lesions              Cervix: no lesions               Bimanual Exam:  Uterus:  normal size, contour, position, consistency, mobility, non-tender  Adnexa: no mass, fullness, tenderness               Rectovaginal: Confirms               Anus:  normal sphincter tone, no lesions, she does have anal tags. Kegel strength is 4/5  Emma [name] chaperoned for the exam.  A:  Well Woman with normal exam  Osteopenia, improved on last imaging  GSI, tolerable  Fecal staining, good kegel strength, loose stools and anal tags make cleaning more challenging.   P:   No pap this year  Mammogram due  DEXA in 6/22  No more colonoscopies  Discussed fecal staining, recommended she try metamucil. Information given

## 2019-12-05 ENCOUNTER — Other Ambulatory Visit: Payer: Self-pay

## 2019-12-06 ENCOUNTER — Ambulatory Visit (INDEPENDENT_AMBULATORY_CARE_PROVIDER_SITE_OTHER): Payer: Medicare PPO | Admitting: Obstetrics and Gynecology

## 2019-12-06 ENCOUNTER — Encounter: Payer: Self-pay | Admitting: Obstetrics and Gynecology

## 2019-12-06 VITALS — BP 100/62 | HR 72 | Temp 98.1°F | Ht 59.5 in | Wt 153.0 lb

## 2019-12-06 DIAGNOSIS — Z8739 Personal history of other diseases of the musculoskeletal system and connective tissue: Secondary | ICD-10-CM | POA: Diagnosis not present

## 2019-12-06 DIAGNOSIS — Z01419 Encounter for gynecological examination (general) (routine) without abnormal findings: Secondary | ICD-10-CM | POA: Diagnosis not present

## 2019-12-06 DIAGNOSIS — R151 Fecal smearing: Secondary | ICD-10-CM | POA: Diagnosis not present

## 2019-12-06 NOTE — Patient Instructions (Signed)
Fecal Incontinence Fecal incontinence, also called accidental bowel leakage, is not being able to control your bowels. This condition happens because the nerves or muscles around the anus do not work the way they should. This affects their ability to hold stool (feces). What are the causes? This condition may be caused by:  Damage to the muscles at the end of the rectum (sphincter).  Damage to the nerves that control bowel movements.  Diarrhea.  Chronic constipation.  Pelvic floor dysfunction. This means the muscles in the pelvis do not work well.  Loss of bowel storage capacity. This occurs when the rectum can no longer stretch in size in order to store feces.  Inflammatory bowel disease (IBD), such as Crohn's disease.  Irritable bowel syndrome (IBS). What increases the risk? You are more likely to develop this condition if you:  Were born with bowels or a pelvis that did not form correctly.  Have had rectal surgery.  Have had radiation treatment for certain cancers.  Have been pregnant, had a vaginal delivery, or had surgery that damaged the pelvic floor muscles.  Had a complicated childbirth, spinal cord injury, or other trauma that caused nerve damage.  Have a condition that can affect nerve function, such as diabetes, Parkinson's disease, or multiple sclerosis.  Have a condition where the rectum drops down into the anus or vagina (prolapse).  Are 27 years of age or older. What are the signs or symptoms? The main symptom of this condition is not being able to control your bowels. You also might not be able to get to the bathroom before a bowel movement. How is this diagnosed? This condition is diagnosed with a medical history and physical exam. You may also have other tests, including:  Blood tests.  Urine tests.  A rectal exam.  Ultrasound.  MRI.  Colonoscopy. This is an exam that looks at your large intestine (colon).  Anal manometry. This is a test that  measures the strength of the anal sphincter.  Anal electromyogram (EMG). This is a test that uses small electrodes to check for nerve damage. How is this treated? Treatment for this condition depends on the cause and severity. Treatment may also focus on addressing any underlying causes of this condition. Treatment may include:  Medicines. This may include medicines to: ? Prevent diarrhea. ? Help with constipation (bulk-forming laxatives). ? Treat any underlying conditions.  Biofeedback therapy. This can help to retrain muscles that are affected.  Fiber supplements. These can help manage your bowel movements.  Nerve stimulation.  Injectable gel to promote tissue growth and better muscle control.  Surgery. You may need: ? Sphincter repair surgery. ? Diversion surgery. This procedure lets feces pass out of your body through a hole in your abdomen. Follow these instructions at home: Eating and drinking   Follow instructions from your health care provider about any eating or drinking restrictions. ? Work with a dietitian to come up with a healthy diet that will help you avoid the foods that can make your condition worse. ? Keep a diet diary to find out which foods or drinks could be making your condition worse.  Drink enough fluid to keep your urine pale yellow. Lifestyle  Do not use any products that contain nicotine or tobacco, such as cigarettes and e-cigarettes. If you need help quitting, ask your health care provider. This may help your condition.  If you are overweight, talk with your health care provider about how to safely lose weight. This may help  your condition.  Increase your physical activity as told by your health care provider. This may help your condition. Always talk with your health care provider before starting a new exercise program.  Carry a change of clothes and supplies to clean up quickly if you have an episode of fetal incontinence.  Consider joining a  fecal incontinence support group. You can find a support group online or in your local community. General instructions   Take over-the-counter and prescription medicines only as told by your health care provider. This includes any supplements.  Apply a moisture barrier, such as petroleum jelly, to your rectum. This protects the skin from irritation caused by ongoing leaking or diarrhea.  Tell your health care provider if you are upset or depressed about your condition.  Keep all follow-up visits as told by your health care provider. This is important. Where to find more information  International [employer] for Functional Gastrointestinal Disorders: iffgd.[place] Gastro[employer]: patients.gi.org Contact a health care provider if:  You have a fever.  You have redness, swelling, or pain around your rectum.  Your pain is getting worse or you lose feeling in your rectal area.  You have blood in your stool.  You feel sad or hopeless.  You avoid social or work situations. Get help right away if:  You stop having bowel movements.  You cannot eat or drink without vomiting.  You have rectal bleeding that does not stop.  You have severe pain that is getting worse.  You have symptoms of dehydration, including: ? Sleepiness or fatigue. ? Producing little or no urine, tears, or sweat. ? Dizziness. ? Dry mouth. ? Unusual irritability. ? Headache. ? Inability to think clearly. Summary  Fecal incontinence, also called accidental bowel leakage, is not being able to control your bowels. This condition happens because the nerves or muscles around the anus do not work the way they should.  Treatment varies depending on the cause and severity of your condition. Treatment may also focus on addressing any underlying causes of this condition.  Follow instructions from your health care provider about any eating or drinking restrictions, lifestyle changes, and skin  care.  Take over-the-counter and prescription medicines only as told by your health care provider. This includes any supplements.  Tell your health care provider if your symptoms worsen or if you are upset or depressed about your condition. This information is not intended to replace advice given to you by your health care provider. Make sure you discuss any questions you have with your health care provider. Document Revised: 11/09/2017 Document Reviewed: 11/09/2017 Elsevier Patient Education  2020 [place] [employer] [facility] [employer]   Anyone who has frequent bowel movements, diarrhea, or bowel leakage (fecal incontinence) may experience soreness or skin irritation around the anal region.  Occasionally, the skin can become so inflamed that it breaks into open sores.  Prevent skin breakdown by following good skin care habits.  Cleaning and Washing Techniques After having a bowel movement, men and women should tighten their anal sphincter before wiping.  Women should always wipe from front to back to prevent fecal matter from getting into the urethra and vagina.   Tips for Cleaning and Washing . wipe from front to back towards the anus . always wipe gently with soft toilet paper, or ideally with moist toilet paper . wipe only once with each piece of toilet paper so as not to re-contaminate the area . wash in warm water alone or  with a minimal amount of mild, fragrance-free soap . use non-biological washing powder . gently pat skin completely dry, avoiding rubbing . if drying the skin after washing is difficult or uncomfortable, try using a hairdryer on a low setting (use very carefully) . allow air to get to the irritated area for some part of every day . use protective skin creams containing zinc as recommended by your doctor  What To Avoid . baths with extra-hot water . soaking for long periods of time in the bathtub . disinfectants and antiseptics  . bath oils, bath salts, and  talcum powder . using plastic pants, pads, and sheets, which cause sweating . scratching at the irritated area  Additional Tips . some people find that citrus and acidic foods cause or worsen skin irritation . eat a healthy, balanced diet that is high in fiber  . drink plenty of fluids . wear cotton underwear to allow the skin to breath . talk to your healthcare provider about further treatment options; persistent problems need medical attention   2007, [employer] Doc.[email or web address]   EXERCISE AND DIET:  We recommended that you start or continue a regular exercise program for good health. Regular exercise means any activity that makes your heart beat faster and makes you sweat.  We recommend exercising at least 30 minutes per day at least 3 days a week, preferably 4 or 5.  We also recommend a diet low in fat and sugar.  Inactivity, poor dietary choices and obesity can cause diabetes, heart attack, stroke, and kidney damage, among others.    ALCOHOL AND SMOKING:  Women should limit their alcohol intake to no more than 7 drinks/beers/glasses of wine (combined, not each!) per week. Moderation of alcohol intake to this level decreases your risk of breast cancer and liver damage. And of course, no recreational drugs are part of a healthy lifestyle.  And absolutely no smoking or even second hand smoke. Most people know smoking can cause heart and lung diseases, but did you know it also contributes to weakening of your bones? Aging of your skin?  Yellowing of your teeth and nails?  CALCIUM AND VITAMIN D:  Adequate intake of calcium and Vitamin D are recommended.  The recommendations for exact amounts of these supplements seem to change often, but generally speaking 1,200 mg of calcium (between diet and supplement) and 800 units of Vitamin D per day seems prudent. Certain women may benefit from higher intake of Vitamin D.  If you are among these women, your doctor will have told you during your visit.     PAP SMEARS:  Pap smears, to check for cervical cancer or precancers,  have traditionally been done yearly, although recent scientific advances have shown that most women can have pap smears less often.  However, every woman still should have a physical exam from her gynecologist every year. It will include a breast check, inspection of the vulva and vagina to check for abnormal growths or skin changes, a visual exam of the cervix, and then an exam to evaluate the size and shape of the uterus and ovaries.  And after 80 years of age, a rectal exam is indicated to check for rectal cancers. We will also provide age appropriate advice regarding health maintenance, like when you should have certain vaccines, screening for sexually transmitted diseases, bone density testing, colonoscopy, mammograms, etc.   MAMMOGRAMS:  All women over 26 years old should have a yearly mammogram. Many facilities now offer a "3D" mammogram,  which may cost around $50 extra out of pocket. If possible,  we recommend you accept the option to have the 3D mammogram performed.  It both reduces the number of women who will be called back for extra views which then turn out to be normal, and it is better than the routine mammogram at detecting truly abnormal areas.    COLON CANCER SCREENING: Now recommend starting at age 36. At this time colonoscopy is not covered for routine screening until 50. There are take home tests that can be done between 45-49.   COLONOSCOPY:  Colonoscopy to screen for colon cancer is recommended for all women at age 73.  We know, you hate the idea of the prep.  We agree, BUT, having colon cancer and not knowing it is worse!!  Colon cancer so often starts as a polyp that can be seen and removed at colonscopy, which can quite literally save your life!  And if your first colonoscopy is normal and you have no family history of colon cancer, most women don't have to have it again for 10 years.  Once every ten years, you  can do something that may end up saving your life, right?  We will be happy to help you get it scheduled when you are ready.  Be sure to check your insurance coverage so you understand how much it will cost.  It may be covered as a preventative service at no cost, but you should check your particular policy.      Breast Self-Awareness Breast self-awareness means being familiar with how your breasts look and feel. It involves checking your breasts regularly and reporting any changes to your health care provider. Practicing breast self-awareness is important. A change in your breasts can be a sign of a serious medical problem. Being familiar with how your breasts look and feel allows you to find any problems early, when treatment is more likely to be successful. All women should practice breast self-awareness, including women who have had breast implants. How to do a breast self-exam One way to learn what is normal for your breasts and whether your breasts are changing is to do a breast self-exam. To do a breast self-exam: Look for Changes  1. Remove all the clothing above your waist. 2. Stand in front of a mirror in a room with good lighting. 3. Put your hands on your hips. 4. Push your hands firmly downward. 5. Compare your breasts in the mirror. Look for differences between them (asymmetry), such as: ? Differences in shape. ? Differences in size. ? Puckers, dips, and bumps in one breast and not the other. 6. Look at each breast for changes in your skin, such as: ? Redness. ? Scaly areas. 7. Look for changes in your nipples, such as: ? Discharge. ? Bleeding. ? Dimpling. ? Redness. ? A change in position. Feel for Changes Carefully feel your breasts for lumps and changes. It is best to do this while lying on your back on the floor and again while sitting or standing in the shower or tub with soapy water on your skin. Feel each breast in the following way:  Place the arm on the side of  the breast you are examining above your head.  Feel your breast with the other hand.  Start in the nipple area and make  inch (2 cm) overlapping circles to feel your breast. Use the pads of your three middle fingers to do this. Apply light pressure, then medium pressure,  then firm pressure. The light pressure will allow you to feel the tissue closest to the skin. The medium pressure will allow you to feel the tissue that is a little deeper. The firm pressure will allow you to feel the tissue close to the ribs.  Continue the overlapping circles, moving downward over the breast until you feel your ribs below your breast.  Move one finger-width toward the center of the body. Continue to use the  inch (2 cm) overlapping circles to feel your breast as you move slowly up toward your collarbone.  Continue the up and down exam using all three pressures until you reach your armpit.  Write Down What You Find  Write down what is normal for each breast and any changes that you find. Keep a written record with breast changes or normal findings for each breast. By writing this information down, you do not need to depend only on memory for size, tenderness, or location. Write down where you are in your menstrual cycle, if you are still menstruating. If you are having trouble noticing differences in your breasts, do not get discouraged. With time you will become more familiar with the variations in your breasts and more comfortable with the exam. How often should I examine my breasts? Examine your breasts every month. If you are breastfeeding, the best time to examine your breasts is after a feeding or after using a breast pump. If you menstruate, the best time to examine your breasts is 5-7 days after your period is over. During your period, your breasts are lumpier, and it may be more difficult to notice changes. When should I see my health care provider? See your health care provider if you notice:  A  change in shape or size of your breasts or nipples.  A change in the skin of your breast or nipples, such as a reddened or scaly area.  Unusual discharge from your nipples.  A lump or thick area that was not there before.  Pain in your breasts.  Anything that concerns you.

## 2020-02-05 ENCOUNTER — Encounter: Payer: Self-pay | Admitting: Obstetrics and Gynecology

## 2020-03-25 DIAGNOSIS — H40013 Open angle with borderline findings, low risk, bilateral: Secondary | ICD-10-CM | POA: Diagnosis not present

## 2020-03-25 DIAGNOSIS — Z961 Presence of intraocular lens: Secondary | ICD-10-CM | POA: Diagnosis not present

## 2020-03-25 DIAGNOSIS — H52203 Unspecified astigmatism, bilateral: Secondary | ICD-10-CM | POA: Diagnosis not present

## 2020-04-30 DIAGNOSIS — L2084 Intrinsic (allergic) eczema: Secondary | ICD-10-CM | POA: Diagnosis not present

## 2020-04-30 DIAGNOSIS — L309 Dermatitis, unspecified: Secondary | ICD-10-CM | POA: Diagnosis not present

## 2020-04-30 DIAGNOSIS — L299 Pruritus, unspecified: Secondary | ICD-10-CM | POA: Diagnosis not present

## 2020-05-26 DIAGNOSIS — L509 Urticaria, unspecified: Secondary | ICD-10-CM | POA: Diagnosis not present

## 2020-05-26 DIAGNOSIS — R748 Abnormal levels of other serum enzymes: Secondary | ICD-10-CM | POA: Diagnosis not present

## 2020-05-26 DIAGNOSIS — D8989 Other specified disorders involving the immune mechanism, not elsewhere classified: Secondary | ICD-10-CM | POA: Diagnosis not present

## 2020-05-26 DIAGNOSIS — J45909 Unspecified asthma, uncomplicated: Secondary | ICD-10-CM | POA: Diagnosis not present

## 2020-05-26 DIAGNOSIS — J449 Chronic obstructive pulmonary disease, unspecified: Secondary | ICD-10-CM | POA: Diagnosis not present

## 2020-05-26 DIAGNOSIS — I10 Essential (primary) hypertension: Secondary | ICD-10-CM | POA: Diagnosis not present

## 2020-05-26 DIAGNOSIS — M8580 Other specified disorders of bone density and structure, unspecified site: Secondary | ICD-10-CM | POA: Diagnosis not present

## 2020-05-26 DIAGNOSIS — I700 Atherosclerosis of aorta: Secondary | ICD-10-CM | POA: Diagnosis not present

## 2020-05-26 DIAGNOSIS — R945 Abnormal results of liver function studies: Secondary | ICD-10-CM | POA: Diagnosis not present

## 2020-05-29 IMAGING — CT CT HEAR MORPH WITH CTA COR WITH SCORE WITH CA WITH CONTRAST AND
4 of 7 series · 8 of 20 positions shown, 9 images · non-contrast
Comparison: None.

Addendum:
CLINICAL DATA: 77-year-old female with h/o hypertension and chest
pain.

EXAM:
Cardiac/Coronary  CT
TECHNIQUE: The patient was scanned on a [employer] Force scanner.
CLINICAL DATA: 77-year-old female with chest pain.

[Series 6: best diast 73 % · axial · 0.39mm/px · z∈[+1121,+1164]mm · 2 of 321 slices shown, 3 images]
[im 107/321  vessel]
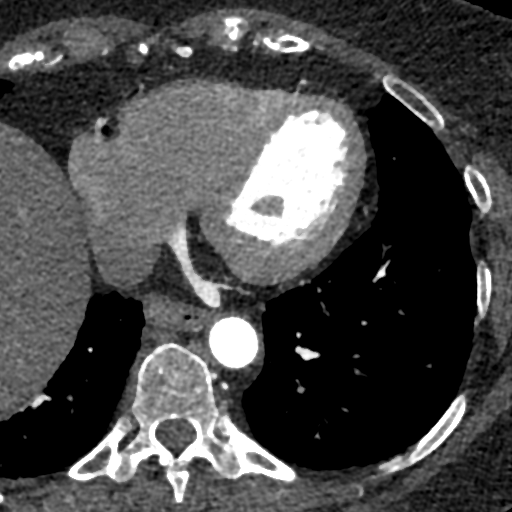
[im 107/321  lung]
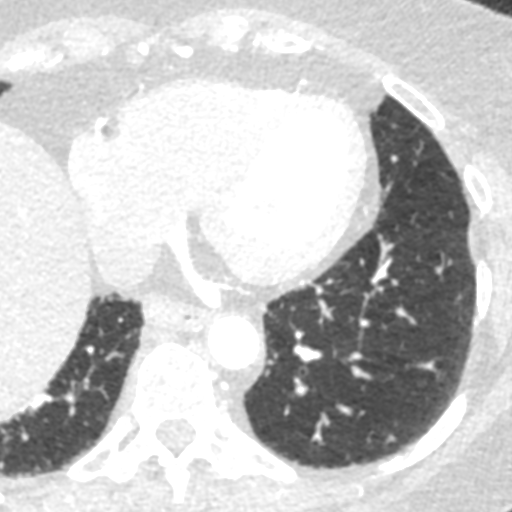
[im 214/321  vessel]
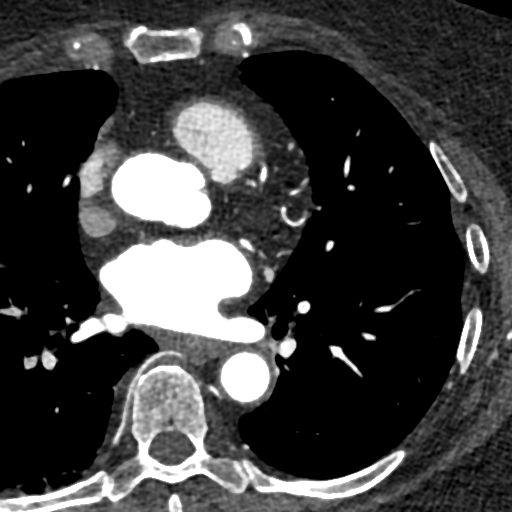

[Series 7: best syst 47 % · axial · 0.39mm/px · z∈[+1121,+1164]mm · 2 of 321 slices shown]
[im 107/321  vessel]
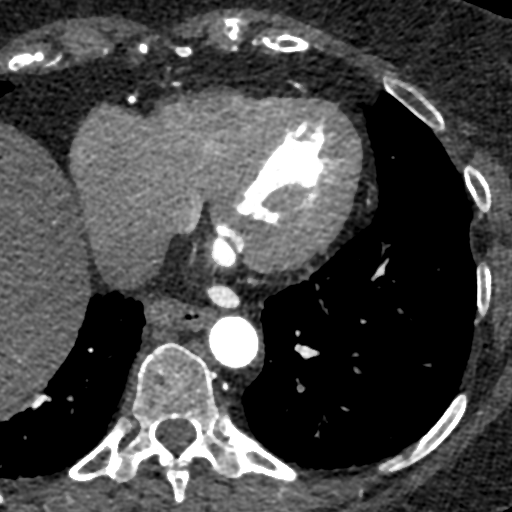
[im 214/321  vessel]
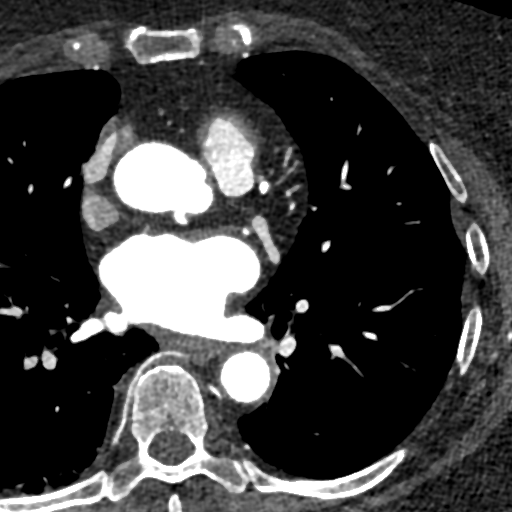

[Series 8: ts diast sharp 73 % · axial · 0.39mm/px · z∈[+1121,+1164]mm · 2 of 321 slices shown]
[im 107/321  lung]
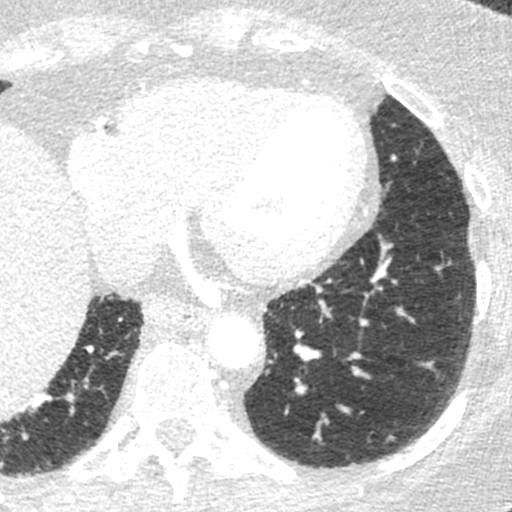
[im 214/321  lung]
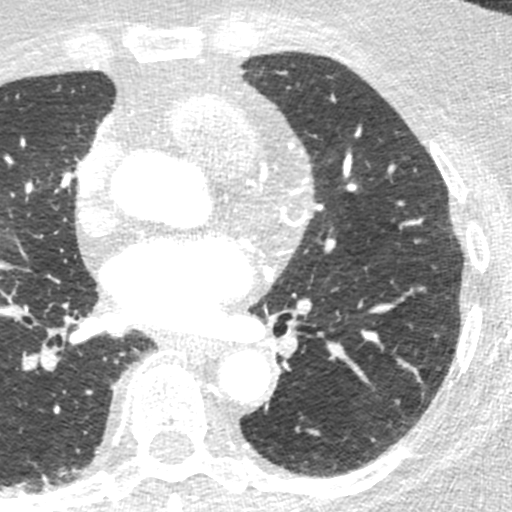

[Series 9: ts syst sharp 47 % · axial · 0.39mm/px · z∈[+1121,+1164]mm · 2 of 321 slices shown]
[im 107/321  lung]
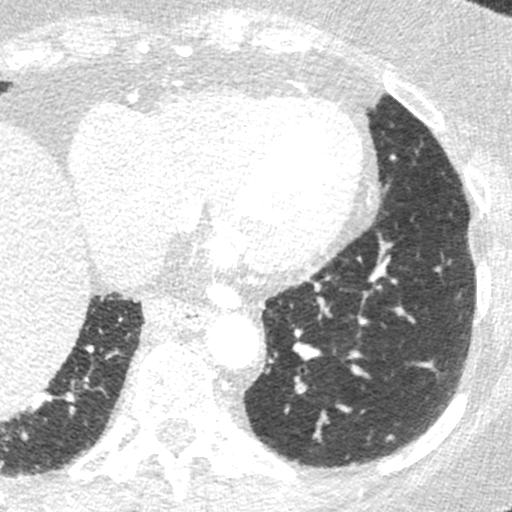
[im 214/321  lung]
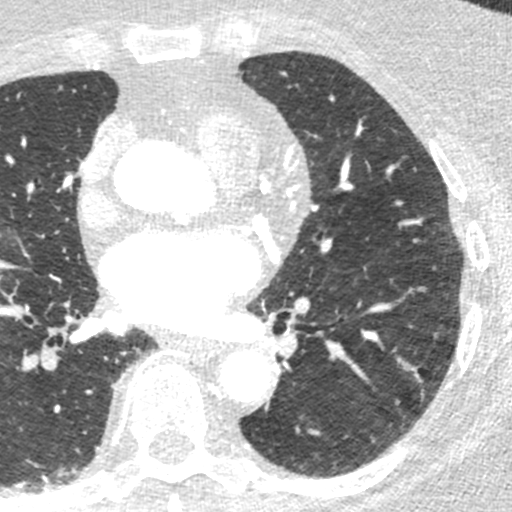

[8 of 20 positions shown; findings below may reference images not displayed]



Aorta: Normal size. Mild diffuse calcifications and atherosclerotic
plaque. No dissection.

Aortic Valve:  Trileaflet.  No calcifications.

Coronary Arteries:  Normal coronary origin.  Right dominance.

RCA is a large dominant artery that gives rise to PDA and PLA.
Proximal RCA has a moderate long calcified plaque with stenosis
50-69%. Mid RCA has mild non-calcified plaque with stenosis 25-49%.
Distal RCA has only minimal plaque.

PDA has minimal plaque.

PLA is a very small artery.

Left main is a large artery that gives rise to LAD, ramus
intermedius and LCX arteries. Left main has no plaque.

LAD is a moderate caliber vessel with mild calcified plaque in the
proximal portion with associated stenosis 25-49%. Mid and distal LAD
has no obvious plaque.

Ramus intermedius is a medium size branch with moderate mixed plaque
in the proximal portion with stenosis 50-69%.

LCX is a small non-dominant artery that has mild calcified plaque
with stenosis 25-49%.

Other findings:

Normal pulmonary vein drainage into the left atrium.

Normal let atrial appendage without a thrombus.

Normal size of the pulmonary artery.
IMPRESSION: 1. Coronary calcium score of 352. This was 77 percentile for age and
sex matched control.

2. Normal coronary origin with right dominance.

3. Moderate plaque in the proximal RCA, proximal portion of ramus
intermedius and mild plaque in the mid RCA and proximal LAD. CAD
RADS. Additional analysis with CT FFR will be submitted.

EXAM:
CT FFR ANALYSIS
FINDINGS: FFRct analysis was performed on the original cardiac CT angiogram
dataset. Diagrammatic representation of the FFRct analysis is
provided in a separate PDF document in PACS. This dictation was
created using the PDF document and an interactive 3D model of the
results. 3D model is not available in the EMR/PACS. Normal FFR range
is >0.80.

1. Left Main: 0.99.

2. LAD: 0.89.
3. Proximal: 1.0, distal 0.55.
4. RCA: 0.92.
IMPRESSION: 1. CT FFR analysis showed hemodynamically significant stenosis in
the midportion of a non-dominant LCX artery.

EXAM:
OVER-READ INTERPRETATION  CT CHEST

The following report is an over-read performed by radiologist Dr.
over-read does not include interpretation of cardiac or coronary
anatomy or pathology. The CT a interpretation by the cardiologist is
attached.
FINDINGS: Limited view of the lung parenchyma demonstrates no suspicious
nodularity. Airways are normal.

Limited view of the mediastinum demonstrates no adenopathy.
Esophagus normal.

Limited view of the upper abdomen unremarkable.

Limited view of the skeleton and chest wall is unremarkable.
IMPRESSION: No significant extracardiac findings.

*** End of Addendum ***
Addendum:
FINDINGS: A 120 kV prospective scan was triggered in the descending thoracic
aorta at 111 HU's. Axial non-contrast 3 mm slices were carried out
through the heart. The data set was analyzed on a dedicated work
station and scored using the Agatson method. Gantry rotation speed
was 250 msecs and collimation was .6 mm. No beta blockade and 0.8 mg
of sl NTG was given. The 3D data set was reconstructed in 5%
intervals of the 67-82 % of the R-R cycle. Diastolic phases were
analyzed on a dedicated work station using MPR, MIP and VRT modes.
The patient received 80 cc of contrast.

Aorta: Normal size. Mild diffuse calcifications and atherosclerotic
plaque. No dissection.

Aortic Valve:  Trileaflet.  No calcifications.

Coronary Arteries:  Normal coronary origin.  Right dominance.

RCA is a large dominant artery that gives rise to PDA and PLA.
Proximal RCA has a moderate long calcified plaque with stenosis
50-69%. Mid RCA has mild non-calcified plaque with stenosis 25-49%.
Distal RCA has only minimal plaque.

PDA has minimal plaque.

PLA is a very small artery.

Left main is a large artery that gives rise to LAD, ramus
intermedius and LCX arteries. Left main has no plaque.

LAD is a moderate caliber vessel with mild calcified plaque in the
proximal portion with associated stenosis 25-49%. Mid and distal LAD
has no obvious plaque.

Ramus intermedius is a medium size branch with moderate mixed plaque
in the proximal portion with stenosis 50-69%.

LCX is a small non-dominant artery that has mild calcified plaque
with stenosis 25-49%.

Other findings:

Normal pulmonary vein drainage into the left atrium.

Normal let atrial appendage without a thrombus.

Normal size of the pulmonary artery.
IMPRESSION: 1. Coronary calcium score of 352. This was 77 percentile for age and
sex matched control.

2. Normal coronary origin with right dominance.

3. Moderate plaque in the proximal RCA, proximal portion of ramus
intermedius and mild plaque in the mid RCA and proximal LAD. CAD
RADS. Additional analysis with CT FFR will be submitted.

EXAM:
CT FFR ANALYSIS
FINDINGS: FFRct analysis was performed on the original cardiac CT angiogram
dataset. Diagrammatic representation of the FFRct analysis is
provided in a separate PDF document in PACS. This dictation was
created using the PDF document and an interactive 3D model of the
results. 3D model is not available in the EMR/PACS. Normal FFR range
is >0.80.

1. Left Main: 0.99.

2. LAD: 0.89.
3. Proximal: 1.0, distal 0.55.
4. RCA: 0.92.
IMPRESSION: 1. CT FFR analysis showed hemodynamically significant stenosis in
the midportion of a non-dominant LCX artery.

*** End of Addendum ***
FINDINGS: A 120 kV prospective scan was triggered in the descending thoracic
aorta at 111 HU's. Axial non-contrast 3 mm slices were carried out
through the heart. The data set was analyzed on a dedicated work
station and scored using the Agatson method. Gantry rotation speed
was 250 msecs and collimation was .6 mm. No beta blockade and 0.8 mg
of sl NTG was given. The 3D data set was reconstructed in 5%
intervals of the 67-82 % of the R-R cycle. Diastolic phases were
analyzed on a dedicated work station using MPR, MIP and VRT modes.
The patient received 80 cc of contrast.

Aorta: Normal size. Mild diffuse calcifications and atherosclerotic
plaque. No dissection.

Aortic Valve:  Trileaflet.  No calcifications.

Coronary Arteries:  Normal coronary origin.  Right dominance.

RCA is a large dominant artery that gives rise to PDA and PLA.
Proximal RCA has a moderate long calcified plaque with stenosis
50-69%. Mid RCA has mild non-calcified plaque with stenosis 25-49%.
Distal RCA has only minimal plaque.

PDA has minimal plaque.

PLA is a very small artery.

Left main is a large artery that gives rise to LAD, ramus
intermedius and LCX arteries. Left main has no plaque.

LAD is a moderate caliber vessel with mild calcified plaque in the
proximal portion with associated stenosis 25-49%. Mid and distal LAD
has no obvious plaque.

Ramus intermedius is a medium size branch with moderate mixed plaque
in the proximal portion with stenosis 50-69%.

LCX is a small non-dominant artery that has mild calcified plaque
with stenosis 25-49%.

Other findings:

Normal pulmonary vein drainage into the left atrium.

Normal let atrial appendage without a thrombus.

Normal size of the pulmonary artery.
IMPRESSION: 1. Coronary calcium score of 352. This was 77 percentile for age and
sex matched control.

2. Normal coronary origin with right dominance.

3. Moderate plaque in the proximal RCA, proximal portion of ramus
intermedius and mild plaque in the mid RCA and proximal LAD. CAD
RADS. Additional analysis with CT FFR will be submitted.

## 2020-06-17 DIAGNOSIS — J449 Chronic obstructive pulmonary disease, unspecified: Secondary | ICD-10-CM | POA: Diagnosis not present

## 2020-06-17 DIAGNOSIS — R0981 Nasal congestion: Secondary | ICD-10-CM | POA: Diagnosis not present

## 2020-06-17 DIAGNOSIS — Z1152 Encounter for screening for COVID-19: Secondary | ICD-10-CM | POA: Diagnosis not present

## 2020-06-17 DIAGNOSIS — J189 Pneumonia, unspecified organism: Secondary | ICD-10-CM | POA: Diagnosis not present

## 2020-06-17 DIAGNOSIS — R059 Cough, unspecified: Secondary | ICD-10-CM | POA: Diagnosis not present

## 2020-06-17 DIAGNOSIS — J209 Acute bronchitis, unspecified: Secondary | ICD-10-CM | POA: Diagnosis not present

## 2020-06-17 DIAGNOSIS — J45909 Unspecified asthma, uncomplicated: Secondary | ICD-10-CM | POA: Diagnosis not present

## 2020-07-20 ENCOUNTER — Other Ambulatory Visit: Payer: Self-pay | Admitting: Cardiovascular Disease

## 2020-07-21 ENCOUNTER — Ambulatory Visit: Payer: Medicare PPO | Admitting: Cardiovascular Disease

## 2020-07-21 ENCOUNTER — Other Ambulatory Visit: Payer: Self-pay

## 2020-07-21 ENCOUNTER — Encounter: Payer: Self-pay | Admitting: Cardiovascular Disease

## 2020-07-21 VITALS — BP 110/50 | HR 84 | Ht 59.5 in | Wt 146.0 lb

## 2020-07-21 DIAGNOSIS — R079 Chest pain, unspecified: Secondary | ICD-10-CM

## 2020-07-21 DIAGNOSIS — I2510 Atherosclerotic heart disease of native coronary artery without angina pectoris: Secondary | ICD-10-CM

## 2020-07-21 DIAGNOSIS — I10 Essential (primary) hypertension: Secondary | ICD-10-CM

## 2020-07-21 MED ORDER — POTASSIUM CHLORIDE CRYS ER 20 MEQ PO TBCR: 20.0000 meq | EXTENDED_RELEASE_TABLET | Freq: Two times a day (BID) | ORAL | 3 refills | Status: DC

## 2020-07-21 MED ORDER — METOPROLOL TARTRATE 25 MG PO TABS: ORAL_TABLET | ORAL | 3 refills | Status: DC

## 2020-07-21 MED ORDER — FUROSEMIDE 20 MG PO TABS: 20.0000 mg | ORAL_TABLET | ORAL | 3 refills | Status: DC

## 2020-07-21 MED ORDER — LOSARTAN POTASSIUM-HCTZ 100-12.5 MG PO TABS: 1.0000 | ORAL_TABLET | Freq: Every day | ORAL | 4 refills | Status: DC

## 2020-07-21 NOTE — Progress Notes (Signed)
07/21/2020 JAMISHA HOESCHEN   1941-06-15  [id number]  Primary Physician Shon Baton, MD Primary Cardiologist: Lorretta Harp MD FACP, [place], [place], [place]  HPI:  Emma [name] is a 80 y.o.  moderately overweight divorced Caucasian female mother of 2 children, grandmother of 5 grandchildren who I initially saw during her hospitalization this past summer for chest pain.  Her primary provider is [name].    I last saw her in the office 07/22/2019. Risk factors include tobacco abuse having quit 2 years ago as well as treated hypertension.  Her father may have had a myocardial infarction.  She is never had a heart attack or stroke.  She is retired Airline pilot.  She did have a coronary CTA during her hospitalization 12/31/2018 that showed a significant mid nondominant circumflex lesion.  She said no recurrent chest pain  Since I saw her a year ago she continues to do well.  She does have some issues with her feet and have referred her to podiatrist.  This does not sound like claudication.  She denies chest pain or shortness of breath.   Current Meds  Medication Sig  . acetaminophen (TYLENOL) 500 MG tablet Take 500-1,900 mg by mouth every 6 (six) hours as needed for mild pain.   . cetaphil (CETAPHIL) cream Apply 1 application topically daily as needed (dry skin).  . cholecalciferol (VITAMIN D) 1000 units tablet Take 1,000 Units by mouth See admin instructions. Take 1 tablet daily except Sundays.  . folic acid (FOLVITE) 1 MG tablet Take 1 mg by mouth daily.  Marland Kitchen gabapentin (NEURONTIN) 300 MG capsule Take 300 mg by mouth at bedtime.  Marland Kitchen ibuprofen (ADVIL,MOTRIN) 200 MG tablet Take 200-400 mg by mouth every 6 (six) hours as needed for fever, headache or mild pain.   . methotrexate (RHEUMATREX) 2.5 MG tablet Take 2 tablets by mouth once a week.   Marland Kitchen Propylene Glycol (SYSTANE BALANCE OP) Place 1 drop into both eyes 2 (two) times daily as needed (dry eyes).   . Vitamin D, Ergocalciferol,  (DRISDOL) 50000 UNITS CAPS Take 50,000 Units by mouth every Sunday.   Grant Ruts INHUB 250-50 MCG/DOSE AEPB Inhale 1 puff into the lungs daily.  . [DISCONTINUED] furosemide (LASIX) 20 MG tablet Take 20 mg by mouth every Monday, Wednesday, and Friday. As needed  . [DISCONTINUED] KLOR-CON M20 20 MEQ tablet Take 20 mEq by mouth 2 (two) times daily.  . [DISCONTINUED] losartan-hydrochlorothiazide (HYZAAR) 100-12.5 MG tablet Take 1 tablet by mouth daily.  . [DISCONTINUED] metoprolol tartrate (LOPRESSOR) 25 MG tablet TAKE 1/2 TABLET(12.5 MG) BY MOUTH TWICE DAILY     Allergies  Allergen Reactions  . Penicillins Hives, Rash and Other (See Comments)    PATIENT HAS HAD A PCN REACTION WITH IMMEDIATE RASH, FACIAL/TONGUE/THROAT SWELLING, SOB, OR LIGHTHEADEDNESS WITH HYPOTENSION:  #  #  #  YES  #  #  #   Has patient had a PCN reaction causing severe rash involving mucus membranes or skin necrosis: no Has patient had a PCN reaction that required hospitalization: no Has patient had a PCN reaction occurring within the last 10 years: #  #  #  YES  #  #  #    . Moxifloxacin Other (See Comments)    Unknown      Social History   Socioeconomic History  . Marital status: Divorced    Spouse name: Not on file  . Number of children: 2  . Years of  education: Not on file  . Highest education level: Not on file  Occupational History  . Occupation: Retired  Tobacco Use  . Smoking status: Former Smoker    Packs/day: 0.25    Years: 50.00    Pack years: 12.50    Types: Cigarettes    Quit date: 08/11/2017    Years since quitting: 2.9  . Smokeless tobacco: Never Used  Vaping Use  . Vaping Use: Never used  Substance and Sexual Activity  . Alcohol use: Yes    Alcohol/week: 2.0 standard drinks    Types: 2 Standard drinks or equivalent per week  . Drug use: No  . Sexual activity: Not Currently    Partners: Male    Birth control/protection: Post-menopausal  Other Topics Concern  . Not on file  Social History  Narrative   2 caffeine drinks daily    Social Determinants of Health   Financial Resource Strain: Not on file  Food Insecurity: Not on file  Transportation Needs: Not on file  Physical Activity: Not on file  Stress: Not on file  Social Connections: Not on file  Intimate Partner Violence: Not on file     Review of Systems: General: negative for chills, fever, night sweats or weight changes.  Cardiovascular: negative for chest pain, dyspnea on exertion, edema, orthopnea, palpitations, paroxysmal nocturnal dyspnea or shortness of breath Dermatological: negative for rash Respiratory: negative for cough or wheezing Urologic: negative for hematuria Abdominal: negative for nausea, vomiting, diarrhea, bright red blood per rectum, melena, or hematemesis Neurologic: negative for visual changes, syncope, or dizziness All other systems reviewed and are otherwise negative except as noted above.    Blood pressure (!) 110/50, pulse 84, height 4' 11.5" (1.511 m), weight 146 lb (66.2 kg), last menstrual period 12/09/1997.  General appearance: alert and no distress Neck: no adenopathy, no carotid bruit, no JVD, supple, symmetrical, trachea midline and thyroid not enlarged, symmetric, no tenderness/mass/nodules Lungs: clear to auscultation bilaterally Heart: regular rate and rhythm, S1, S2 normal, no murmur, click, rub or gallop Extremities: extremities normal, atraumatic, no cyanosis or edema Pulses: 2+ and symmetric Skin: Skin color, texture, turgor normal. No rashes or lesions Neurologic: Alert and oriented X 3, normal strength and tone. Normal symmetric reflexes. Normal coordination and gait  EKG sinus rhythm 84 without ST or T wave changes.  Personally reviewed this EKG.  ASSESSMENT AND PLAN:   HTN (hypertension) History of essential hypertension with blood pressure measured at 110/50.  She is on losartan, hydrochlorothiazide and metoprolol.  Chest pain with moderate risk for cardiac  etiology History of chest pain in the past with coronary CTA performed 01/02/2019 revealing a physiologically significant lesion in the midportion of a small nondominant circumflex otherwise normal coronary arteries.  She has had no recurrent chest pain.      Lorretta Harp MD FACP,FACC,FAHA, [facility] 07/21/2020 1:50 PM

## 2020-07-21 NOTE — Patient Instructions (Signed)

## 2020-07-21 NOTE — Assessment & Plan Note (Signed)
History of chest pain in the past with coronary CTA performed 01/02/2019 revealing a physiologically significant lesion in the midportion of a small nondominant circumflex otherwise normal coronary arteries.  She has had no recurrent chest pain.

## 2020-07-21 NOTE — Assessment & Plan Note (Signed)
History of essential hypertension with blood pressure measured at 110/50.  She is on losartan, hydrochlorothiazide and metoprolol.

## 2020-08-13 DIAGNOSIS — M76821 Posterior tibial tendinitis, right leg: Secondary | ICD-10-CM | POA: Diagnosis not present

## 2020-08-13 DIAGNOSIS — M76822 Posterior tibial tendinitis, left leg: Secondary | ICD-10-CM | POA: Diagnosis not present

## 2020-08-13 DIAGNOSIS — M79671 Pain in right foot: Secondary | ICD-10-CM | POA: Diagnosis not present

## 2020-08-13 DIAGNOSIS — M79672 Pain in left foot: Secondary | ICD-10-CM | POA: Diagnosis not present

## 2020-11-17 DIAGNOSIS — I10 Essential (primary) hypertension: Secondary | ICD-10-CM | POA: Diagnosis not present

## 2020-11-17 DIAGNOSIS — J449 Chronic obstructive pulmonary disease, unspecified: Secondary | ICD-10-CM | POA: Diagnosis not present

## 2020-11-17 DIAGNOSIS — J45909 Unspecified asthma, uncomplicated: Secondary | ICD-10-CM | POA: Diagnosis not present

## 2020-11-17 DIAGNOSIS — J0100 Acute maxillary sinusitis, unspecified: Secondary | ICD-10-CM | POA: Diagnosis not present

## 2020-11-17 DIAGNOSIS — R0981 Nasal congestion: Secondary | ICD-10-CM | POA: Diagnosis not present

## 2020-11-17 DIAGNOSIS — Z1152 Encounter for screening for COVID-19: Secondary | ICD-10-CM | POA: Diagnosis not present

## 2020-11-19 DIAGNOSIS — Z5181 Encounter for therapeutic drug level monitoring: Secondary | ICD-10-CM | POA: Diagnosis not present

## 2020-11-19 DIAGNOSIS — L309 Dermatitis, unspecified: Secondary | ICD-10-CM | POA: Diagnosis not present

## 2020-11-19 DIAGNOSIS — L299 Pruritus, unspecified: Secondary | ICD-10-CM | POA: Diagnosis not present

## 2020-11-19 DIAGNOSIS — L2084 Intrinsic (allergic) eczema: Secondary | ICD-10-CM | POA: Diagnosis not present

## 2020-11-27 DIAGNOSIS — E042 Nontoxic multinodular goiter: Secondary | ICD-10-CM | POA: Diagnosis not present

## 2020-11-27 DIAGNOSIS — R739 Hyperglycemia, unspecified: Secondary | ICD-10-CM | POA: Diagnosis not present

## 2020-11-27 DIAGNOSIS — I10 Essential (primary) hypertension: Secondary | ICD-10-CM | POA: Diagnosis not present

## 2020-11-27 DIAGNOSIS — E559 Vitamin D deficiency, unspecified: Secondary | ICD-10-CM | POA: Diagnosis not present

## 2020-12-04 DIAGNOSIS — D8989 Other specified disorders involving the immune mechanism, not elsewhere classified: Secondary | ICD-10-CM | POA: Diagnosis not present

## 2020-12-04 DIAGNOSIS — Z1331 Encounter for screening for depression: Secondary | ICD-10-CM | POA: Diagnosis not present

## 2020-12-04 DIAGNOSIS — R600 Localized edema: Secondary | ICD-10-CM | POA: Diagnosis not present

## 2020-12-04 DIAGNOSIS — R945 Abnormal results of liver function studies: Secondary | ICD-10-CM | POA: Diagnosis not present

## 2020-12-04 DIAGNOSIS — R82998 Other abnormal findings in urine: Secondary | ICD-10-CM | POA: Diagnosis not present

## 2020-12-04 DIAGNOSIS — D692 Other nonthrombocytopenic purpura: Secondary | ICD-10-CM | POA: Diagnosis not present

## 2020-12-04 DIAGNOSIS — M8580 Other specified disorders of bone density and structure, unspecified site: Secondary | ICD-10-CM | POA: Diagnosis not present

## 2020-12-04 DIAGNOSIS — J449 Chronic obstructive pulmonary disease, unspecified: Secondary | ICD-10-CM | POA: Diagnosis not present

## 2020-12-04 DIAGNOSIS — Z1389 Encounter for screening for other disorder: Secondary | ICD-10-CM | POA: Diagnosis not present

## 2020-12-04 DIAGNOSIS — Z0000 Encounter for general adult medical examination without abnormal findings: Secondary | ICD-10-CM | POA: Diagnosis not present

## 2020-12-04 DIAGNOSIS — I700 Atherosclerosis of aorta: Secondary | ICD-10-CM | POA: Diagnosis not present

## 2020-12-04 DIAGNOSIS — I10 Essential (primary) hypertension: Secondary | ICD-10-CM | POA: Diagnosis not present

## 2020-12-08 NOTE — Progress Notes (Deleted)
80 y.o. G39P2002 Divorced White or Caucasian [name] here for annual exam.      Patient's last menstrual period was 12/09/1997 (approximate).          Sexually active: {yes no:314532}  The current method of family planning is {contraception:315051}.    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 (she was on Evista many years ago) Colonoscopy: 01-29-14 polyps, was told no more colonoscopies. TDaP:  2016  Gardasil: NA   reports that she quit smoking about 3 years ago. Her smoking use included cigarettes. She has a 12.50 pack-year smoking history. She has never used smokeless tobacco. She reports current alcohol use of about 2.0 standard drinks of alcohol per week. She reports that she does not use drugs.  Past Medical History:  Diagnosis Date  . Allergy   . Asthma    as child  . 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 pt. unsure of pathology  . Carpal tunnel syndrome   . Cataract   . Condyloma   . Constipation   . Hx of adenomatous colonic polyps   . Hypertension   . Hypothyroidism    2016- synthroid d/c'd   . Osteoarthritis    OA- knee- L knee, R thumb  . Osteopenia   . Thyroid disease   . Vitamin D deficiency     Past Surgical History:  Procedure Laterality Date  . BELPHAROPTOSIS REPAIR Bilateral 04/2015   vision correction  . BREAST BIOPSY     right   . CATARACT EXTRACTION, BILATERAL Bilateral 01/2014, 02/2014   /w IOL  . CESAREAN SECTION     x 2   . CHOLECYSTECTOMY    . EYE SURGERY    . GALLBLADDER SURGERY    . IR RADIOLOGY PERIPHERAL GUIDED IV START  01/01/2019  . IR US GUIDE VASC ACCESS RIGHT  01/01/2019  . LAPAROSCOPIC APPENDECTOMY N/A 05/30/2018    Procedure: APPENDECTOMY LAPAROSCOPIC;  Surgeon: Leighton Ruff, MD;  Location: [facility];  Service: General;  Laterality: N/A;  . SHOULDER SURGERY  2006   right   . SHOULDER SURGERY Left 07/17/2013    bone spurs, and partial tear of rotator cuff  . TOTAL KNEE ARTHROPLASTY Left 08/14/2017   Procedure: LEFT TOTAL KNEE ARTHROPLASTY;  Surgeon: Vickey Huger, MD;  Location: [place];  Service: Orthopedics;  Laterality: Left;  . TUBAL LIGATION      Current Outpatient Medications  Medication Sig Dispense Refill  . acetaminophen (TYLENOL) 500 MG tablet Take 500-1,900 mg by mouth every 6 (six) hours as needed for mild pain.     . cetaphil (CETAPHIL) cream Apply 1 application topically daily as needed (dry skin).    . cholecalciferol (VITAMIN D) 1000 units tablet Take 1,000 Units by mouth See admin instructions. Take 1 tablet daily except Sundays.    . folic acid (FOLVITE) 1 MG tablet Take 1 mg by mouth daily.    . furosemide (LASIX) 20 MG tablet Take 1 tablet (20 mg total) by mouth every Monday, Wednesday, and Friday. As needed 45 tablet 3  . gabapentin (NEURONTIN) 300 MG capsule Take 300 mg by mouth at bedtime.    Marland Kitchen ibuprofen (  ADVIL,MOTRIN) 200 MG tablet Take 200-400 mg by mouth every 6 (six) hours as needed for fever, headache or mild pain.     Marland Kitchen losartan-hydrochlorothiazide (HYZAAR) 100-12.5 MG tablet Take 1 tablet by mouth daily. 90 tablet 4  . methotrexate (RHEUMATREX) 2.5 MG tablet Take 2 tablets by mouth once a week.     . metoprolol tartrate (LOPRESSOR) 25 MG tablet Take 1/2 tablet (12.5mg ) by mouth twice daily. 90 tablet 3  . potassium chloride SA (KLOR-CON M20) 20 MEQ tablet Take 1 tablet (20 mEq total) by mouth 2 (two) times daily. 180 tablet 3  . Propylene Glycol (SYSTANE BALANCE OP) Place 1 drop into both eyes 2 (two) times daily as needed (dry eyes).     . Vitamin D, Ergocalciferol, (DRISDOL) 50000 UNITS CAPS Take 50,000 Units by mouth every Sunday.     Grant Ruts INHUB 250-50 MCG/DOSE AEPB Inhale  1 puff into the lungs daily.     No current facility-administered medications for this visit.    Family History  Problem Relation Age of Onset  . Heart disease Father   . Diabetes Paternal Grandfather   . Asthma Son   . Colon cancer Neg Hx     Review of Systems  Exam:   LMP 12/09/1997 (Approximate)   Weight change: @WEIGHTCHANGE @ Height:      Ht Readings from Last 3 Encounters:  07/21/20 4' 11.5" (1.511 m)  12/06/19 4' 11.5" (1.511 m)  07/19/19 5\' 1"  (1.549 m)    General appearance: alert, cooperative and appears stated age Head: Normocephalic, without obvious abnormality, atraumatic Neck: no adenopathy, supple, symmetrical, trachea midline and thyroid {CHL AMB PHY EX THYROID NORM DEFAULT:[phone]::"normal to inspection and palpation"} Lungs: clear to auscultation bilaterally Cardiovascular: regular rate and rhythm Breasts: {Exam; breast:13139::"normal appearance, no masses or tenderness"} Abdomen: soft, non-tender; non distended,  no masses,  no organomegaly Extremities: extremities normal, atraumatic, no cyanosis or edema Skin: Skin color, texture, turgor normal. No rashes or lesions Lymph nodes: Cervical, supraclavicular, and axillary nodes normal. No abnormal inguinal nodes palpated Neurologic: Grossly normal   Pelvic: External genitalia:  no lesions              Urethra:  normal appearing urethra with no masses, tenderness or lesions              Bartholins and Skenes: normal                 Vagina: normal appearing vagina with normal color and discharge, no lesions              Cervix: {CHL AMB PHY EX CERVIX NORM [phone]::"no lesions"}               Bimanual Exam:  Uterus:  {CHL AMB PHY EX UTERUS NORM [phone]::"normal size, contour, position, consistency, mobility, non-tender"}              Adnexa: {CHL AMB PHY EX ADNEXA NO MASS DEFAULT:[id number]::"no mass, fullness, tenderness"}               Rectovaginal: Confirms               Anus:   normal sphincter tone, no lesions  *** chaperoned for the exam.  A:  Well Woman with normal exam  P:

## 2020-12-09 ENCOUNTER — Ambulatory Visit: Payer: Medicare PPO | Admitting: Obstetrics and Gynecology

## 2020-12-09 NOTE — Progress Notes (Signed)
80 y.o. G52P2002 Divorced White or Caucasian [name] here for annual exam.  No vaginal bleeding.   She has mixed incontinence. Sometimes leaks on the way to the bathroom or with cough, sneeze. No change, symptoms are tolerable.   Fecal staining is better. Normal BM qd.    She had mild case of covid in April. Recently had her 4th covid vaccination.   Patient's last menstrual period was 12/09/1997 (approximate).          Sexually active: No.  The current method of family planning is post menopausal status.    Exercising: Yes.    Gym/ health club routine includes swimming. Smoker:  no  Health Maintenance: Pap:  10/13/2017 WNL, 09-16-14 WNL  History of abnormal Pap:  yes H/O CIN I/VAIN I in the 90's  MMG:  12/26/19 benign, just had one this week.   BMD:   01/07/19 osteopenia,  T score -1.6, FRAX 11/2.3%, improvement. (she was on Evista many years ago) Colonoscopy: 01-29-14 polyps, was told no more colonoscopies. TDaP:  2016  Gardasil: NA   reports that she quit smoking about 3 years ago. Her smoking use included cigarettes. She has a 12.50 pack-year smoking history. She has never used smokeless tobacco. She reports current alcohol use of about 2.0 standard drinks of alcohol per week. She reports that she does not use drugs. 2 kids, both local. 5 grand children.  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 pt. unsure of pathology   Carpal tunnel syndrome    Cataract    Condyloma    Constipation    Hx of adenomatous colonic polyps    Hypertension    Hypothyroidism    2016- synthroid d/c'd    Osteoarthritis    OA- knee- L knee, R thumb   Osteopenia    Thyroid disease    Vitamin D deficiency     Past Surgical History:  Procedure Laterality Date   BELPHAROPTOSIS REPAIR Bilateral 04/2015   vision correction   BREAST BIOPSY      right    CATARACT EXTRACTION, BILATERAL Bilateral 01/2014, 02/2014   /w IOL   CESAREAN SECTION     x 2    CHOLECYSTECTOMY     EYE SURGERY     GALLBLADDER SURGERY     IR RADIOLOGY PERIPHERAL GUIDED IV START  01/01/2019   IR US GUIDE VASC ACCESS RIGHT  01/01/2019   LAPAROSCOPIC APPENDECTOMY N/A 05/30/2018   Procedure: APPENDECTOMY LAPAROSCOPIC;  Surgeon: Leighton Ruff, MD;  Location: [facility];  Service: General;  Laterality: N/A;   SHOULDER SURGERY  2006   right    SHOULDER SURGERY Left 07/17/2013    bone spurs, and partial tear of rotator cuff   TOTAL KNEE ARTHROPLASTY Left 08/14/2017   Procedure: LEFT TOTAL KNEE ARTHROPLASTY;  Surgeon: Vickey Huger, MD;  Location: [facility];  Service: Orthopedics;  Laterality: Left;   TUBAL LIGATION      Current Outpatient Medications  Medication Sig Dispense Refill   acetaminophen (TYLENOL) 500 MG tablet Take 500-1,900 mg by mouth every 6 (six) hours as needed for mild pain.      cetaphil (CETAPHIL) cream Apply 1 application topically daily as needed (dry skin).     cholecalciferol (VITAMIN D) 1000 units tablet Take 1,000 Units by mouth  See admin instructions. Take 1 tablet daily except Sundays.     folic acid (FOLVITE) 1 MG tablet Take 1 mg by mouth daily.     furosemide (LASIX) 20 MG tablet Take 1 tablet (20 mg total) by mouth every Monday, Wednesday, and Friday. As needed 45 tablet 3   gabapentin (NEURONTIN) 300 MG capsule Take 300 mg by mouth at bedtime.     ibuprofen (ADVIL,MOTRIN) 200 MG tablet Take 200-400 mg by mouth every 6 (six) hours as needed for fever, headache or mild pain.      losartan-hydrochlorothiazide (HYZAAR) 100-12.5 MG tablet Take 1 tablet by mouth daily. 90 tablet 4   methotrexate (RHEUMATREX) 2.5 MG tablet Take 2 tablets by mouth once a week.      metoprolol tartrate (LOPRESSOR) 25 MG tablet Take 1/2 tablet (12.5mg ) by mouth twice daily. 90 tablet 3   potassium chloride SA (KLOR-CON M20) 20 MEQ tablet Take 1 tablet (20 mEq total) by  mouth 2 (two) times daily. 180 tablet 3   Propylene Glycol (SYSTANE BALANCE OP) Place 1 drop into both eyes 2 (two) times daily as needed (dry eyes).      Vitamin D, Ergocalciferol, (DRISDOL) 50000 UNITS CAPS Take 50,000 Units by mouth every Sunday.      WIXELA INHUB 250-50 MCG/DOSE AEPB Inhale 1 puff into the lungs daily.     No current facility-administered medications for this visit.    Family History  Problem Relation Age of Onset   Heart disease Father    Diabetes Paternal Grandfather    Asthma Son    Colon cancer Neg Hx     Review of Systems  Exam:   BP 130/60   Pulse 68   Ht 4\' 11"  (1.499 m)   Wt 146 lb (66.2 kg)   LMP 12/09/1997 (Approximate)   SpO2 100%   BMI 29.49 kg/m   Weight change: @WEIGHTCHANGE @ Height:   Height: 4\' 11"  (149.9 cm)  Ht Readings from Last 3 Encounters:  12/18/20 4\' 11"  (1.499 m)  07/21/20 4' 11.5" (1.511 m)  12/06/19 4' 11.5" (1.511 m)    General appearance: alert, cooperative and appears stated age Head: Normocephalic, without obvious abnormality, atraumatic Neck: no adenopathy, supple, symmetrical, trachea midline and thyroid normal to inspection and palpation Breasts: normal appearance, no masses or tenderness Abdomen: soft, non-tender; non distended,  no masses,  no organomegaly Extremities: extremities normal, atraumatic, no cyanosis or edema Skin: Skin color, texture, turgor normal. No rashes or lesions Lymph nodes: Cervical, supraclavicular, and axillary nodes normal. No abnormal inguinal nodes palpated Neurologic: Grossly normal   Pelvic: External genitalia:  no lesions              Urethra:  normal appearing urethra with no masses, tenderness or lesions              Bartholins and Skenes: normal                 Vagina: normal appearing vagina with normal color and discharge, no lesions              Cervix: no lesions and stenotic               Bimanual Exam:  Uterus:  normal size, contour, position, consistency, mobility,  non-tender              Adnexa: no mass, fullness, tenderness               Rectovaginal: Confirms  Anus:  normal sphincter tone, no lesions  Gae Dry chaperoned for the exam.  1. Encounter for gynecological examination without abnormal finding Discussed breast self exam Labs with primary Mammogram just done  2. History of osteopenia DEXA ordered at [facility] Discussed calcium and vit D intake  3. Mixed incontinence Discussed kegels

## 2020-12-14 DIAGNOSIS — Z1212 Encounter for screening for malignant neoplasm of rectum: Secondary | ICD-10-CM | POA: Diagnosis not present

## 2020-12-15 DIAGNOSIS — Z1231 Encounter for screening mammogram for malignant neoplasm of breast: Secondary | ICD-10-CM | POA: Diagnosis not present

## 2020-12-18 ENCOUNTER — Ambulatory Visit (INDEPENDENT_AMBULATORY_CARE_PROVIDER_SITE_OTHER): Payer: Medicare PPO | Admitting: Obstetrics and Gynecology

## 2020-12-18 ENCOUNTER — Encounter: Payer: Self-pay | Admitting: Obstetrics and Gynecology

## 2020-12-18 ENCOUNTER — Other Ambulatory Visit: Payer: Self-pay

## 2020-12-18 VITALS — BP 130/60 | HR 68 | Ht 59.0 in | Wt 146.0 lb

## 2020-12-18 DIAGNOSIS — Z124 Encounter for screening for malignant neoplasm of cervix: Secondary | ICD-10-CM

## 2020-12-18 DIAGNOSIS — N3946 Mixed incontinence: Secondary | ICD-10-CM | POA: Diagnosis not present

## 2020-12-18 DIAGNOSIS — Z8739 Personal history of other diseases of the musculoskeletal system and connective tissue: Secondary | ICD-10-CM

## 2020-12-18 DIAGNOSIS — Z01419 Encounter for gynecological examination (general) (routine) without abnormal findings: Secondary | ICD-10-CM

## 2020-12-18 DIAGNOSIS — M8589 Other specified disorders of bone density and structure, multiple sites: Secondary | ICD-10-CM | POA: Diagnosis not present

## 2020-12-18 NOTE — Patient Instructions (Addendum)
EXERCISE   We recommended that you start or continue a regular exercise program for good health. Physical activity is anything that gets your body moving, some is better than none. The CDC recommends 150 minutes per week of Moderate-Intensity Aerobic Activity and 2 or more days of Muscle Strengthening Activity.  Benefits of exercise are limitless: helps weight loss/weight maintenance, improves mood and energy, helps with depression and anxiety, improves sleep, tones and strengthens muscles, improves balance, improves bone density, protects from chronic conditions such as heart disease, high blood pressure and diabetes and so much more. To learn more visit: https://www.cdc.gov/physicalactivity/index.html  DIET: Good nutrition starts with a healthy diet of fruits, vegetables, whole grains, and lean protein sources. Drink plenty of water for hydration. Minimize empty calories, sodium, sweets. For more information about dietary recommendations visit: https://health.gov/our-work/nutrition-physical-activity/dietary-guidelines and https://www.myplate.gov/  ALCOHOL:  Women should limit their alcohol intake to no more than 7 drinks/beers/glasses of wine (combined, not each!) per week. Moderation of alcohol intake to this level decreases your risk of breast cancer and liver damage.  If you are concerned that you may have a problem, or your friends have told you they are concerned about your drinking, there are many resources to help. A well-known program that is free, effective, and available to all people all over the nation is Alcoholics Anonymous.  Check out this site to learn more: https://www.aa.org/   CALCIUM AND VITAMIN D:  Adequate intake of calcium and Vitamin D are recommended for bone health.  You should be getting between 1000-1200 mg of calcium and 800 units of Vitamin D daily between diet and supplements  PAP SMEARS:  Pap smears, to check for cervical cancer or precancers,  have traditionally been  done yearly, scientific advances have shown that most women can have pap smears less often.  However, every woman still should have a physical exam from her gynecologist every year. It will include a breast check, inspection of the vulva and vagina to check for abnormal growths or skin changes, a visual exam of the cervix, and then an exam to evaluate the size and shape of the uterus and ovaries. We will also provide age appropriate advice regarding health maintenance, like when you should have certain vaccines, screening for sexually transmitted diseases, bone density testing, colonoscopy, mammograms, etc.   MAMMOGRAMS:  All women over 40 years old should have a routine mammogram.   COLON CANCER SCREENING: Now recommend starting at age 45. At this time colonoscopy is not covered for routine screening until 50. There are take home tests that can be done between 45-49.   COLONOSCOPY:  Colonoscopy to screen for colon cancer is recommended for all women at age 50.  We know, you hate the idea of the prep.  We agree, BUT, having colon cancer and not knowing it is worse!!  Colon cancer so often starts as a polyp that can be seen and removed at colonscopy, which can quite literally save your life!  And if your first colonoscopy is normal and you have no family history of colon cancer, most women don't have to have it again for 10 years.  Once every ten years, you can do something that may end up saving your life, right?  We will be happy to help you get it scheduled when you are ready.  Be sure to check your insurance coverage so you understand how much it will cost.  It may be covered as a preventative service at no cost, but you should check   your particular policy.      Breast Self-Awareness Breast self-awareness means being familiar with how your breasts look and feel. It involves checking your breasts regularly and reporting any changes to your health care provider. Practicing breast self-awareness is  important. A change in your breasts can be a sign of a serious medical problem. Being familiar with how your breasts look and feel allows you to find any problems early, when treatment is more likely to be successful. All women should practice breast self-awareness, including women who have had breast implants. How to do a breast self-exam One way to learn what is normal for your breasts and whether your breasts are changing is to do a breast self-exam. To do a breast self-exam: Look for Changes  Remove all the clothing above your waist. Stand in front of a mirror in a room with good lighting. Put your hands on your hips. Push your hands firmly downward. Compare your breasts in the mirror. Look for differences between them (asymmetry), such as: Differences in shape. Differences in size. Puckers, dips, and bumps in one breast and not the other. Look at each breast for changes in your skin, such as: Redness. Scaly areas. Look for changes in your nipples, such as: Discharge. Bleeding. Dimpling. Redness. A change in position. Feel for Changes Carefully feel your breasts for lumps and changes. It is best to do this while lying on your back on the floor and again while sitting or standing in the shower or tub with soapy water on your skin. Feel each breast in the following way: Place the arm on the side of the breast you are examining above your head. Feel your breast with the other hand. Start in the nipple area and make  inch (2 cm) overlapping circles to feel your breast. Use the pads of your three middle fingers to do this. Apply light pressure, then medium pressure, then firm pressure. The light pressure will allow you to feel the tissue closest to the skin. The medium pressure will allow you to feel the tissue that is a little deeper. The firm pressure will allow you to feel the tissue close to the ribs. Continue the overlapping circles, moving downward over the breast until you feel your  ribs below your breast. Move one finger-width toward the center of the body. Continue to use the  inch (2 cm) overlapping circles to feel your breast as you move slowly up toward your collarbone. Continue the up and down exam using all three pressures until you reach your armpit.  Write Down What You Find  Write down what is normal for each breast and any changes that you find. Keep a written record with breast changes or normal findings for each breast. By writing this information down, you do not need to depend only on memory for size, tenderness, or location. Write down where you are in your menstrual cycle, if you are still menstruating. If you are having trouble noticing differences in your breasts, do not get discouraged. With time you will become more familiar with the variations in your breasts and more comfortable with the exam. How often should I examine my breasts? Examine your breasts every month. If you are breastfeeding, the best time to examine your breasts is after a feeding or after using a breast pump. If you menstruate, the best time to examine your breasts is 5-7 days after your period is over. During your period, your breasts are lumpier, and it may be more   difficult to notice changes. When should I see my health care provider? See your health care provider if you notice: A change in shape or size of your breasts or nipples. A change in the skin of your breast or nipples, such as a reddened or scaly area. Unusual discharge from your nipples. A lump or thick area that was not there before. Pain in your breasts. Anything that concerns you. Kegel Exercises  Kegel exercises can help strengthen your pelvic floor muscles. The pelvic floor is a group of muscles that support your rectum, small intestine, and bladder. In females, pelvic floor muscles also help support the womb (uterus). These muscles help you control the flow of urine and stool. Kegel exercises are painless and  simple, and they do not require any equipment. Your provider may suggest Kegel exercises to: Improve bladder and bowel control. Improve sexual response. Improve weak pelvic floor muscles after surgery to remove the uterus (hysterectomy) or pregnancy (females). Improve weak pelvic floor muscles after prostate gland removal or surgery (males). Kegel exercises involve squeezing your pelvic floor muscles, which are the same muscles you squeeze when you try to stop the flow of urine or keep from passing gas. The exercises can be done while sitting, standing, or lying down, but itis best to vary your position. Exercises How to do Kegel exercises: Squeeze your pelvic floor muscles tight. You should feel a tight lift in your rectal area. If you are a female, you should also feel a tightness in your vaginal area. Keep your stomach, buttocks, and legs relaxed. Hold the muscles tight for up to 10 seconds. Breathe normally. Relax your muscles. Repeat as told by your health care provider. Repeat this exercise daily as told by your health care provider. Continue to do this exercise for at least 4-6 weeks, or for as long as told by your healthcare provider. You may be referred to a physical therapist who can help you learn more abouthow to do Kegel exercises. Depending on your condition, your health care provider may recommend: Varying how long you squeeze your muscles. Doing several sets of exercises every day. Doing exercises for several weeks. Making Kegel exercises a part of your regular exercise routine. This information is not intended to replace advice given to you by your health care provider. Make sure you discuss any questions you have with your healthcare provider. Document Revised: 06/17/2020 Document Reviewed: 02/14/2018 Elsevier Patient Education  [place].

## 2020-12-24 ENCOUNTER — Other Ambulatory Visit (HOSPITAL_COMMUNITY)
Admission: RE | Admit: 2020-12-24 | Discharge: 2020-12-24 | Disposition: A | Discharge status: Home / Self Care | Payer: Medicare PPO | Source: Ambulatory Visit | Attending: Obstetrics and Gynecology | Admitting: Obstetrics and Gynecology

## 2020-12-24 DIAGNOSIS — Z124 Encounter for screening for malignant neoplasm of cervix: Secondary | ICD-10-CM | POA: Diagnosis not present

## 2020-12-24 NOTE — Addendum Note (Signed)
Addended by: Burnice Logan on: 12/24/2020 12:26 PM   Modules accepted: Orders

## 2020-12-25 LAB — CYTOLOGY - PAP
Adequacy: ABSENT
Diagnosis: NEGATIVE

## 2021-01-21 DIAGNOSIS — Z780 Asymptomatic menopausal state: Secondary | ICD-10-CM | POA: Diagnosis not present

## 2021-01-21 DIAGNOSIS — M85852 Other specified disorders of bone density and structure, left thigh: Secondary | ICD-10-CM | POA: Diagnosis not present

## 2021-01-21 DIAGNOSIS — M85851 Other specified disorders of bone density and structure, right thigh: Secondary | ICD-10-CM | POA: Diagnosis not present

## 2021-01-26 DIAGNOSIS — Z96652 Presence of left artificial knee joint: Secondary | ICD-10-CM | POA: Diagnosis not present

## 2021-01-26 DIAGNOSIS — M25462 Effusion, left knee: Secondary | ICD-10-CM | POA: Diagnosis not present

## 2021-01-26 DIAGNOSIS — S8002XA Contusion of left knee, initial encounter: Secondary | ICD-10-CM | POA: Diagnosis not present

## 2021-01-31 ENCOUNTER — Telehealth: Payer: Self-pay | Admitting: Obstetrics and Gynecology

## 2021-01-31 NOTE — Telephone Encounter (Signed)
Please let the patient know that she still has osteopenia, but that she is at an elevated risk of developing a hip fracture and treatment is recommended. Please schedule her for a visit to discuss.

## 2021-02-03 DIAGNOSIS — L821 Other seborrheic keratosis: Secondary | ICD-10-CM | POA: Diagnosis not present

## 2021-02-03 DIAGNOSIS — D692 Other nonthrombocytopenic purpura: Secondary | ICD-10-CM | POA: Diagnosis not present

## 2021-02-03 DIAGNOSIS — I788 Other diseases of capillaries: Secondary | ICD-10-CM | POA: Diagnosis not present

## 2021-02-03 DIAGNOSIS — L720 Epidermal cyst: Secondary | ICD-10-CM | POA: Diagnosis not present

## 2021-02-03 DIAGNOSIS — L570 Actinic keratosis: Secondary | ICD-10-CM | POA: Diagnosis not present

## 2021-02-08 NOTE — Telephone Encounter (Signed)
Patient informed, sent appointments message to call and schedule.

## 2021-02-09 ENCOUNTER — Other Ambulatory Visit: Payer: Self-pay

## 2021-02-09 ENCOUNTER — Ambulatory Visit: Payer: Medicare PPO | Admitting: Obstetrics and Gynecology

## 2021-02-09 ENCOUNTER — Encounter: Payer: Self-pay | Admitting: Obstetrics and Gynecology

## 2021-02-09 VITALS — BP 110/60 | HR 66 | Ht 59.0 in | Wt 146.0 lb

## 2021-02-09 DIAGNOSIS — M810 Age-related osteoporosis without current pathological fracture: Secondary | ICD-10-CM | POA: Diagnosis not present

## 2021-02-09 NOTE — Progress Notes (Signed)
GYNECOLOGY  VISIT   HPI: 80 y.o.   Divorced White or Caucasian Not Hispanic or Latino  female   [phone] with Patient's last menstrual period was 12/09/1997 (approximate).   here for dexa consult Her DEXA fom 01/21/21 returned with a T score of -1.4, FRAX of 13/3%.   Labs from 05/26/20 with normal CBC and CMP. She had blood work drawn in May (will get a copy).   She is taking vit d daily, also gets 50,000 IU every Sunday.  She is getting some calcium in her diet, not 4 servings a day. She is doing water aerobics, can't walk secondary to foot pain.   She has a h/o a knee replacement, balance isn't what it used to be.   GYNECOLOGIC HISTORY: Patient's last menstrual period was 12/09/1997 (approximate). Contraception:PMP Menopausal hormone therapy: none         OB History     Gravida  2   Para  2   Term  2   Preterm      AB      Living  2      SAB      IAB      Ectopic      Multiple      Live Births                 Patient Active Problem List   Diagnosis Date Noted   Elevated transaminase level 01/12/2019   Hypokalemia 12/31/2018   Leukocytosis 12/31/2018   Abnormal LFTs 12/31/2018   HTN (hypertension) 12/31/2018   Chest pain with moderate risk for cardiac etiology 12/31/2018   Alcohol use 12/31/2018   DDD (degenerative disc disease), cervical 10/12/2018   Primary osteoarthritis of both first carpometacarpal joints 07/23/2018   Gangrenous appendicitis 05/31/2018   Acute gangrenous appendicitis s/p lap appendectomy 05/30/2018 05/30/2018   S/P total knee replacement 08/14/2017   Methotrexate, long term, current use 08/26/2013   S/P arthroscopy of shoulder 07/22/2013   Acute bronchitis 04/02/2013   COPD (chronic obstructive pulmonary disease) ([place]) 04/02/2013   Osteoarthritis of right knee 02/12/2013   Hearing loss 12/15/2012   Hypothyroidism 12/15/2012    Past Medical History:  Diagnosis Date   Allergy    Asthma    as child   Bowen's disease  06/16/2004   left superior vulva   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 pt. unsure of pathology   Carpal tunnel syndrome    Cataract    Condyloma    Constipation    Hx of adenomatous colonic polyps    Hypertension    Hypothyroidism    2016- synthroid d/c'd    Osteoarthritis    OA- knee- L knee, R thumb   Osteopenia    Thyroid disease    Vitamin D deficiency     Past Surgical History:  Procedure Laterality Date   BELPHAROPTOSIS REPAIR Bilateral 04/2015   vision correction   BREAST BIOPSY     right    CATARACT EXTRACTION, BILATERAL Bilateral 01/2014, 02/2014   /w IOL   CESAREAN SECTION     x 2    CHOLECYSTECTOMY     EYE SURGERY     GALLBLADDER SURGERY     IR RADIOLOGY PERIPHERAL GUIDED IV START  01/01/2019   IR US GUIDE VASC ACCESS RIGHT  01/01/2019   LAPAROSCOPIC APPENDECTOMY N/A 05/30/2018   Procedure: APPENDECTOMY LAPAROSCOPIC;  Surgeon: Leighton Ruff, MD;  Location: [facility];  Service: General;  Laterality: N/A;  SHOULDER SURGERY  2006   right    SHOULDER SURGERY Left 07/17/2013    bone spurs, and partial tear of rotator cuff   TOTAL KNEE ARTHROPLASTY Left 08/14/2017   Procedure: LEFT TOTAL KNEE ARTHROPLASTY;  Surgeon: Vickey Huger, MD;  Location: [facility];  Service: Orthopedics;  Laterality: Left;   TUBAL LIGATION      Current Outpatient Medications  Medication Sig Dispense Refill   acetaminophen (TYLENOL) 500 MG tablet Take 500-1,900 mg by mouth every 6 (six) hours as needed for mild pain.      cetaphil (CETAPHIL) cream Apply 1 application topically daily as needed (dry skin).     cholecalciferol (VITAMIN D) 1000 units tablet Take 1,000 Units by mouth See admin instructions. Take 1 tablet daily except Sundays.     folic acid (FOLVITE) 1 MG tablet Take 1 mg by mouth daily.     furosemide (LASIX) 20 MG tablet Take 1 tablet (20 mg total) by mouth every Monday, Wednesday, and Friday. As needed 45 tablet 3   gabapentin (NEURONTIN) 300 MG capsule Take 300 mg by  mouth at bedtime.     ibuprofen (ADVIL,MOTRIN) 200 MG tablet Take 200-400 mg by mouth every 6 (six) hours as needed for fever, headache or mild pain.      losartan-hydrochlorothiazide (HYZAAR) 100-12.5 MG tablet Take 1 tablet by mouth daily. 90 tablet 4   methotrexate (RHEUMATREX) 2.5 MG tablet Take 2 tablets by mouth once a week.      metoprolol tartrate (LOPRESSOR) 25 MG tablet Take 1/2 tablet (12.'5mg'$ ) by mouth twice daily. 90 tablet 3   potassium chloride SA (KLOR-CON M20) 20 MEQ tablet Take 1 tablet (20 mEq total) by mouth 2 (two) times daily. 180 tablet 3   Propylene Glycol (SYSTANE BALANCE OP) Place 1 drop into both eyes 2 (two) times daily as needed (dry eyes).      Vitamin D, Ergocalciferol, (DRISDOL) 50000 UNITS CAPS Take 50,000 Units by mouth every Sunday.      WIXELA INHUB 250-50 MCG/DOSE AEPB Inhale 1 puff into the lungs daily.     No current facility-administered medications for this visit.     ALLERGIES: Penicillins and Moxifloxacin  Family History  Problem Relation Age of Onset   Heart disease Father    Diabetes Paternal Grandfather    Asthma Son    Colon cancer Neg Hx     Social History   Socioeconomic History   Marital status: Divorced    Spouse name: Not on file   Number of children: 2   Years of education: Not on file   Highest education level: Not on file  Occupational History   Occupation: Retired  Tobacco Use   Smoking status: Former    Packs/day: 0.25    Years: 50.00    Pack years: 12.50    Types: Cigarettes    Quit date: 08/11/2017    Years since quitting: 3.5   Smokeless tobacco: Never  Vaping Use   Vaping Use: Never used  Substance and Sexual Activity   Alcohol use: Yes    Alcohol/week: 2.0 standard drinks    Types: 2 Standard drinks or equivalent per week   Drug use: No   Sexual activity: Not Currently    Partners: Male    Birth control/protection: Post-menopausal  Other Topics Concern   Not on file  Social History Narrative   2 caffeine  drinks daily    Social Determinants of Health   Financial Resource Strain: Not on file  Food Insecurity: Not on file  Transportation Needs: Not on file  Physical Activity: Not on file  Stress: Not on file  Social Connections: Not on file  Intimate Partner Violence: Not on file    Review of Systems  All other systems reviewed and are negative.  PHYSICAL EXAMINATION:    BP 110/60   Pulse 66   Ht '4\' 11"'$  (1.499 m)   Wt 146 lb (66.2 kg)   LMP 12/09/1997 (Approximate)   SpO2 99%   BMI 29.49 kg/m     General appearance: alert, cooperative and appears stated age  44. Age-related osteoporosis without current pathological fracture Will get a copy of her routine labs (just had lab work in 5/22). Needs CBC, CMP, vit D, Magnesium and phosphorous. Will have her return for any blood work that wasn't done  Discussed calcium, vit D, weight bearing exercise Recommended she start Fosamax. Reviewed the risks and side effects. She will consider.  Information from Epic and up to date given.  ~26 minutes in total patient care.

## 2021-02-09 NOTE — Patient Instructions (Addendum)
You should be getting 1,200 mg of calcium a day between your diet and supplements.    I would recommend that you start Fosamax (if you lab work is all normal) Osteoporosis  Osteoporosis happens when the bones become thin and less dense than normal. Osteoporosis makes bones more brittle and fragile and more likely to break (fracture). Over time, osteoporosis can cause your bones to become so weak that they fracture after a minor fall. Bones in the hip, wrist, and spine are most likelyto fracture due to osteoporosis. What are the causes? The exact cause of this condition is not known. What increases the risk? You are more likely to develop this condition if you: Have family members with this condition. Have poor nutrition. Use the following: Steroid medicines, such as prednisone. Anti-seizure medicines. Nicotine or tobacco, such as cigarettes, e-cigarettes, and chewing tobacco. Are female. Are age 29 or older. Are not physically active (are sedentary). Are of European or Asian descent. Have a small body frame. What are the signs or symptoms? A fracture might be the first sign of osteoporosis, especially if the fracture results from a fall or injury that usually would not cause a bone to break. Other signs and symptoms include: Pain in the neck or low back. Stooped posture. Loss of height. How is this diagnosed? This condition may be diagnosed based on: Your medical history. A physical exam. A bone mineral density test, also called a DXA or DEXA test (dual-energy X-ray absorptiometry test). This test uses X-rays to measure the amount of minerals in your bones. How is this treated? This condition may be treated by: Making lifestyle changes, such as: Including foods with more calcium and vitamin D in your diet. Doing weight-bearing and muscle-strengthening exercises. Stopping tobacco use. Limiting alcohol intake. Taking medicine to slow the process of bone loss or to increase bone  density. Taking daily supplements of calcium and vitamin D. Taking hormone replacement medicines, such as estrogen for women and testosterone for men. Monitoring your levels of calcium and vitamin D. The goal of treatment is to strengthen your bones and lower your risk for afracture. Follow these instructions at home: Eating and drinking Include calcium and vitamin D in your diet. Calcium is important for bone health, and vitamin D helps your body absorb calcium. Good sources of calcium and vitamin D include: Certain fatty fish, such as salmon and tuna. Products that have calcium and vitamin D added to them (are fortified), such as fortified cereals. Egg yolks. Cheese. Liver.  Activity Do exercises as told by your health care provider. Ask your health care provider what exercises and activities are safe for you. You should do: Exercises that make you work against gravity (weight-bearing exercises), such as tai chi, yoga, or walking. Exercises to strengthen muscles, such as lifting weights. Lifestyle Do not drink alcohol if: Your health care provider tells you not to drink. You are pregnant, may be pregnant, or are planning to become pregnant. If you drink alcohol: Limit how much you use to: 0-1 drink a day for women. 0-2 drinks a day for men. Know how much alcohol is in your drink. In the [place] one drink equals one 12 oz bottle of beer (355 mL), one 5 oz glass of wine (148 mL), or one 1 oz glass of hard liquor (44 mL). Do not use any products that contain nicotine or tobacco, such as cigarettes, e-cigarettes, and chewing tobacco. If you need help quitting, ask your health care provider. Preventing falls Use  devices to help you move around (mobility aids) as needed, such as canes, walkers, scooters, or crutches. Keep rooms well-lit and clutter-free. Remove tripping hazards from walkways, including cords and throw rugs. Install grab bars in bathrooms and safety rails on stairs. Use  rubber mats in the bathroom and other areas that are often wet or slippery. Wear closed-toe shoes that fit well and support your feet. Wear shoes that have rubber soles or low heels. Review your medicines with your health care provider. Some medicines can cause dizziness or changes in blood pressure, which can increase your risk of falling. General instructions Take over-the-counter and prescription medicines only as told by your health care provider. Keep all follow-up visits. This is important. Contact a health care provider if: You have never been screened for osteoporosis and you are: A woman who is age 22 or older. A man who is age 45 or older. Get help right away if: You fall or injure yourself. Summary Osteoporosis is thinning and loss of density in your bones. This makes bones more brittle and fragile and more likely to break (fracture),even with minor falls. The goal of treatment is to strengthen your bones and lower your risk for a fracture. Include calcium and vitamin D in your diet. Calcium is important for bone health, and vitamin D helps your body absorb calcium. Talk with your health care provider about screening for osteoporosis if you are a woman who is age 38 or older, or a man who is age 84 or older. This information is not intended to replace advice given to you by your health care provider. Make sure you discuss any questions you have with your healthcare provider. Document Revised: 12/12/2019 Document Reviewed: 12/12/2019 Elsevier Patient Education  [place].

## 2021-03-31 DIAGNOSIS — H40013 Open angle with borderline findings, low risk, bilateral: Secondary | ICD-10-CM | POA: Diagnosis not present

## 2021-03-31 DIAGNOSIS — Z961 Presence of intraocular lens: Secondary | ICD-10-CM | POA: Diagnosis not present

## 2021-03-31 DIAGNOSIS — H5213 Myopia, bilateral: Secondary | ICD-10-CM | POA: Diagnosis not present

## 2021-05-13 ENCOUNTER — Other Ambulatory Visit: Payer: Self-pay

## 2021-05-13 ENCOUNTER — Telehealth: Payer: Self-pay

## 2021-05-13 ENCOUNTER — Encounter: Payer: Self-pay | Admitting: Obstetrics and Gynecology

## 2021-05-13 ENCOUNTER — Ambulatory Visit: Payer: Medicare PPO | Admitting: Obstetrics and Gynecology

## 2021-05-13 VITALS — BP 120/62 | HR 66 | Wt 148.0 lb

## 2021-05-13 DIAGNOSIS — R300 Dysuria: Secondary | ICD-10-CM | POA: Diagnosis not present

## 2021-05-13 DIAGNOSIS — N3090 Cystitis, unspecified without hematuria: Secondary | ICD-10-CM | POA: Diagnosis not present

## 2021-05-13 MED ORDER — NITROFURANTOIN MONOHYD MACRO 100 MG PO CAPS: 100.0000 mg | ORAL_CAPSULE | Freq: Two times a day (BID) | ORAL | 0 refills | Status: DC

## 2021-05-13 NOTE — Progress Notes (Signed)
GYNECOLOGY  VISIT   HPI: 80 y.o.   Divorced White or Caucasian Not Hispanic or Latino  female   [phone] with Patient's last menstrual period was 12/09/1997 (approximate).   here for frequency of urination.  She c/o a one week h/o frequency of urination, urgency to void. No pain. No flank pain or fevers.   GYNECOLOGIC HISTORY: Patient's last menstrual period was 12/09/1997 (approximate). Contraception:PMP  Menopausal hormone therapy: none         OB History     Gravida  2   Para  2   Term  2   Preterm      AB      Living  2      SAB      IAB      Ectopic      Multiple      Live Births                 Patient Active Problem List   Diagnosis Date Noted   Elevated transaminase level 01/12/2019   Hypokalemia 12/31/2018   Leukocytosis 12/31/2018   Abnormal LFTs 12/31/2018   HTN (hypertension) 12/31/2018   Chest pain with moderate risk for cardiac etiology 12/31/2018   Alcohol use 12/31/2018   DDD (degenerative disc disease), cervical 10/12/2018   Primary osteoarthritis of both first carpometacarpal joints 07/23/2018   Gangrenous appendicitis 05/31/2018   Acute gangrenous appendicitis s/p lap appendectomy 05/30/2018 05/30/2018   S/P total knee replacement 08/14/2017   Methotrexate, long term, current use 08/26/2013   S/P arthroscopy of shoulder 07/22/2013   Acute bronchitis 04/02/2013   COPD (chronic obstructive pulmonary disease) ([place]) 04/02/2013   Osteoarthritis of right knee 02/12/2013   Hearing loss 12/15/2012   Hypothyroidism 12/15/2012    Past Medical History:  Diagnosis Date   Allergy    Asthma    as child   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 pt. unsure of pathology   Carpal tunnel syndrome    Cataract    Condyloma    Constipation    Hx of adenomatous colonic polyps    Hypertension    Hypothyroidism    2016- synthroid d/c'd    Osteoarthritis    OA- knee- L knee, R  thumb   Osteopenia    Thyroid disease    Vitamin D deficiency     Past Surgical History:  Procedure Laterality Date   BELPHAROPTOSIS REPAIR Bilateral 04/2015   vision correction   BREAST BIOPSY     right    CATARACT EXTRACTION, BILATERAL Bilateral 01/2014, 02/2014   /w IOL   CESAREAN SECTION     x 2    CHOLECYSTECTOMY     EYE SURGERY     GALLBLADDER SURGERY     IR RADIOLOGY PERIPHERAL GUIDED IV START  01/01/2019   IR US GUIDE VASC ACCESS RIGHT  01/01/2019   LAPAROSCOPIC APPENDECTOMY N/A 05/30/2018   Procedure: APPENDECTOMY LAPAROSCOPIC;  Surgeon: Leighton Ruff, MD;  Location: [facility];  Service: General;  Laterality: N/A;   SHOULDER SURGERY  2006   right    SHOULDER SURGERY Left 07/17/2013    bone spurs, and partial tear of rotator cuff   TOTAL KNEE ARTHROPLASTY Left 08/14/2017   Procedure: LEFT TOTAL KNEE ARTHROPLASTY;  Surgeon: Vickey Huger, MD;  Location: [facility];  Service: Orthopedics;  Laterality: Left;   TUBAL LIGATION      Current Outpatient Medications  Medication Sig Dispense Refill   acetaminophen (TYLENOL) 500 MG tablet Take 500-1,900 mg by mouth every 6 (six) hours as needed for mild pain.      cetaphil (CETAPHIL) cream Apply 1 application topically daily as needed (dry skin).     cholecalciferol (VITAMIN D) 1000 units tablet Take 1,000 Units by mouth See admin instructions. Take 1 tablet daily except Sundays.     folic acid (FOLVITE) 1 MG tablet Take 1 mg by mouth daily.     furosemide (LASIX) 20 MG tablet Take 1 tablet (20 mg total) by mouth every Monday, Wednesday, and Friday. As needed 45 tablet 3   gabapentin (NEURONTIN) 300 MG capsule Take 300 mg by mouth at bedtime.     ibuprofen (ADVIL,MOTRIN) 200 MG tablet Take 200-400 mg by mouth every 6 (six) hours as needed for fever, headache or mild pain.      losartan-hydrochlorothiazide (HYZAAR) 100-12.5 MG tablet Take 1 tablet by mouth daily. 90 tablet 4   methotrexate (RHEUMATREX) 2.5 MG tablet Take 2 tablets by mouth  once a week.      metoprolol tartrate (LOPRESSOR) 25 MG tablet Take 1/2 tablet (12.5mg ) by mouth twice daily. 90 tablet 3   potassium chloride SA (KLOR-CON M20) 20 MEQ tablet Take 1 tablet (20 mEq total) by mouth 2 (two) times daily. 180 tablet 3   Propylene Glycol (SYSTANE BALANCE OP) Place 1 drop into both eyes 2 (two) times daily as needed (dry eyes).      Vitamin D, Ergocalciferol, (DRISDOL) 50000 UNITS CAPS Take 50,000 Units by mouth every Sunday.      WIXELA INHUB 250-50 MCG/DOSE AEPB Inhale 1 puff into the lungs daily.     No current facility-administered medications for this visit.     ALLERGIES: Penicillins and Moxifloxacin  Family History  Problem Relation Age of Onset   Heart disease Father    Diabetes Paternal Grandfather    Asthma Son    Colon cancer Neg Hx     Social History   Socioeconomic History   Marital status: Divorced    Spouse name: Not on file   Number of children: 2   Years of education: Not on file   Highest education level: Not on file  Occupational History   Occupation: Retired  Tobacco Use   Smoking status: Former    Packs/day: 0.25    Years: 50.00    Pack years: 12.50    Types: Cigarettes    Quit date: 08/11/2017    Years since quitting: 3.7   Smokeless tobacco: Never  Vaping Use   Vaping Use: Never used  Substance and Sexual Activity   Alcohol use: Yes    Alcohol/week: 2.0 standard drinks    Types: 2 Standard drinks or equivalent per week   Drug use: No   Sexual activity: Not Currently    Partners: Male    Birth control/protection: Post-menopausal  Other Topics Concern   Not on file  Social History Narrative   2 caffeine drinks daily    Social Determinants of Health   Financial Resource Strain: Not on file  Food Insecurity: Not on file  Transportation Needs: Not on file  Physical Activity: Not on file  Stress: Not on file  Social Connections: Not on file  Intimate Partner Violence: Not on file    Review of Systems   Genitourinary:  Positive for frequency.  All other systems reviewed and are negative.  PHYSICAL EXAMINATION:    BP 120/62   Pulse 66  Wt 148 lb (67.1 kg)   LMP 12/09/1997 (Approximate)   SpO2 100%   BMI 29.89 kg/m     General appearance: alert, cooperative and appears stated age  66. Cystitis - Urinalysis,Complete w/RFL Culture: ua >60 WBC, 3-10 RBC -Can't take bactrim with methotrexate, normal renal and liver function. - nitrofurantoin, macrocrystal-monohydrate, (MACROBID) 100 MG capsule; Take 1 capsule (100 mg total) by mouth 2 (two) times daily.  Dispense: 10 capsule; Refill: 0 -Call if not improving in 48 hours.

## 2021-05-13 NOTE — Telephone Encounter (Signed)
Patient called complaining of UTI sx.  Feels like needs to urinate often. No burning. AZO has not helped. Sx started last Friday.   Informed, per Butch Penny, that Dr. Dewitt Hoes can see her today at 1:45pm. Patient would like that appt. Appt scheduled.

## 2021-05-13 NOTE — Patient Instructions (Signed)
Urinary Tract Infection, Adult A urinary tract infection (UTI) is an infection of any part of the urinary tract. The urinary tract includes the kidneys, ureters, bladder, and urethra. These organs make, store, and get rid of urine in the body. An upper UTI affects the ureters and kidneys. A lower UTI affects the bladder and urethra. What are the causes? Most urinary tract infections are caused by bacteria in your genital area around your urethra, where urine leaves your body. These bacteria grow and cause inflammation of your urinary tract. What increases the risk? You are more likely to develop this condition if: You have a urinary catheter that stays in place. You are not able to control when you urinate or have a bowel movement (incontinence). You are female and you: Use a spermicide or diaphragm for birth control. Have low estrogen levels. Are pregnant. You have certain genes that increase your risk. You are sexually active. You take antibiotic medicines. You have a condition that causes your flow of urine to slow down, such as: An enlarged prostate, if you are female. Blockage in your urethra. A kidney stone. A nerve condition that affects your bladder control (neurogenic bladder). Not getting enough to drink, or not urinating often. You have certain medical conditions, such as: Diabetes. A weak disease-fighting system (immunesystem). Sickle cell disease. Gout. Spinal cord injury. What are the signs or symptoms? Symptoms of this condition include: Needing to urinate right away (urgency). Frequent urination. This may include small amounts of urine each time you urinate. Pain or burning with urination. Blood in the urine. Urine that smells bad or unusual. Trouble urinating. Cloudy urine. Vaginal discharge, if you are female. Pain in the abdomen or the lower back. You may also have: Vomiting or a decreased appetite. Confusion. Irritability or tiredness. A fever or  chills. Diarrhea. The first symptom in older adults may be confusion. In some cases, they may not have any symptoms until the infection has worsened. How is this diagnosed? This condition is diagnosed based on your medical history and a physical exam. You may also have other tests, including: Urine tests. Blood tests. Tests for STIs (sexually transmitted infections). If you have had more than one UTI, a cystoscopy or imaging studies may be done to determine the cause of the infections. How is this treated? Treatment for this condition includes: Antibiotic medicine. Over-the-counter medicines to treat discomfort. Drinking enough water to stay hydrated. If you have frequent infections or have other conditions such as a kidney stone, you may need to see a health care provider who specializes in the urinary tract (urologist). In rare cases, urinary tract infections can cause sepsis. Sepsis is a life-threatening condition that occurs when the body responds to an infection. Sepsis is treated in the hospital with IV antibiotics, fluids, and other medicines. Follow these instructions at home: Medicines Take over-the-counter and prescription medicines only as told by your health care provider. If you were prescribed an antibiotic medicine, take it as told by your health care provider. Do not stop using the antibiotic even if you start to feel better. General instructions Make sure you: Empty your bladder often and completely. Do not hold urine for long periods of time. Empty your bladder after sex. Wipe from front to back after urinating or having a bowel movement if you are female. Use each tissue only one time when you wipe. Drink enough fluid to keep your urine pale yellow. Keep all follow-up visits. This is important. Contact a health care provider   if: Your symptoms do not get better after 1-2 days. Your symptoms go away and then return. Get help right away if: You have severe pain in your  back or your lower abdomen. You have a fever or chills. You have nausea or vomiting. Summary A urinary tract infection (UTI) is an infection of any part of the urinary tract, which includes the kidneys, ureters, bladder, and urethra. Most urinary tract infections are caused by bacteria in your genital area. Treatment for this condition often includes antibiotic medicines. If you were prescribed an antibiotic medicine, take it as told by your health care provider. Do not stop using the antibiotic even if you start to feel better. Keep all follow-up visits. This is important. This information is not intended to replace advice given to you by your health care provider. Make sure you discuss any questions you have with your health care provider. Document Revised: 02/07/2020 Document Reviewed: 02/07/2020 Elsevier Patient Education  2022 Elsevier Inc.  

## 2021-05-16 LAB — URINALYSIS, COMPLETE W/RFL CULTURE
Bilirubin Urine: NEGATIVE
Crystals: NONE SEEN /HPF
Glucose, UA: NEGATIVE
Ketones, ur: NEGATIVE
Nitrites, Initial: NEGATIVE
Specific Gravity, Urine: 1.02 (ref 1.001–1.035)
WBC, UA: >=60 /HPF — AB (ref 0–5)
Yeast: NONE SEEN /HPF
pH: 6 (ref 5.0–8.0)

## 2021-05-16 LAB — URINE CULTURE
MICRO NUMBER:: 12589601
SPECIMEN QUALITY:: ADEQUATE

## 2021-05-16 LAB — CULTURE INDICATED

## 2021-05-24 DIAGNOSIS — L299 Pruritus, unspecified: Secondary | ICD-10-CM | POA: Diagnosis not present

## 2021-05-24 DIAGNOSIS — L2084 Intrinsic (allergic) eczema: Secondary | ICD-10-CM | POA: Diagnosis not present

## 2021-05-24 DIAGNOSIS — L309 Dermatitis, unspecified: Secondary | ICD-10-CM | POA: Diagnosis not present

## 2021-06-14 DIAGNOSIS — I700 Atherosclerosis of aorta: Secondary | ICD-10-CM | POA: Diagnosis not present

## 2021-06-14 DIAGNOSIS — N3090 Cystitis, unspecified without hematuria: Secondary | ICD-10-CM | POA: Diagnosis not present

## 2021-06-14 DIAGNOSIS — D692 Other nonthrombocytopenic purpura: Secondary | ICD-10-CM | POA: Diagnosis not present

## 2021-06-14 DIAGNOSIS — E559 Vitamin D deficiency, unspecified: Secondary | ICD-10-CM | POA: Diagnosis not present

## 2021-06-14 DIAGNOSIS — M8580 Other specified disorders of bone density and structure, unspecified site: Secondary | ICD-10-CM | POA: Diagnosis not present

## 2021-06-14 DIAGNOSIS — E042 Nontoxic multinodular goiter: Secondary | ICD-10-CM | POA: Diagnosis not present

## 2021-06-14 DIAGNOSIS — J45909 Unspecified asthma, uncomplicated: Secondary | ICD-10-CM | POA: Diagnosis not present

## 2021-06-14 DIAGNOSIS — I10 Essential (primary) hypertension: Secondary | ICD-10-CM | POA: Diagnosis not present

## 2021-06-14 DIAGNOSIS — R739 Hyperglycemia, unspecified: Secondary | ICD-10-CM | POA: Diagnosis not present

## 2021-06-14 DIAGNOSIS — R945 Abnormal results of liver function studies: Secondary | ICD-10-CM | POA: Diagnosis not present

## 2021-06-21 DIAGNOSIS — R600 Localized edema: Secondary | ICD-10-CM | POA: Diagnosis not present

## 2021-06-21 DIAGNOSIS — E663 Overweight: Secondary | ICD-10-CM | POA: Diagnosis not present

## 2021-06-21 DIAGNOSIS — I872 Venous insufficiency (chronic) (peripheral): Secondary | ICD-10-CM | POA: Diagnosis not present

## 2021-06-21 DIAGNOSIS — I10 Essential (primary) hypertension: Secondary | ICD-10-CM | POA: Diagnosis not present

## 2021-07-16 ENCOUNTER — Ambulatory Visit: Payer: Medicare PPO | Admitting: Cardiovascular Disease

## 2021-07-16 ENCOUNTER — Other Ambulatory Visit: Payer: Self-pay

## 2021-07-16 ENCOUNTER — Encounter: Payer: Self-pay | Admitting: Cardiovascular Disease

## 2021-07-16 VITALS — BP 122/58 | HR 68 | Ht 60.0 in | Wt 146.0 lb

## 2021-07-16 DIAGNOSIS — I2510 Atherosclerotic heart disease of native coronary artery without angina pectoris: Secondary | ICD-10-CM

## 2021-07-16 DIAGNOSIS — I10 Essential (primary) hypertension: Secondary | ICD-10-CM

## 2021-07-16 MED ORDER — METOPROLOL TARTRATE 25 MG PO TABS: ORAL_TABLET | ORAL | 4 refills | Status: DC

## 2021-07-16 MED ORDER — FUROSEMIDE 20 MG PO TABS: 20.0000 mg | ORAL_TABLET | ORAL | 3 refills | Status: DC

## 2021-07-16 MED ORDER — LOSARTAN POTASSIUM-HCTZ 100-12.5 MG PO TABS: 1.0000 | ORAL_TABLET | Freq: Every day | ORAL | 4 refills | Status: AC

## 2021-07-16 MED ORDER — POTASSIUM CHLORIDE CRYS ER 20 MEQ PO TBCR: 20.0000 meq | EXTENDED_RELEASE_TABLET | Freq: Two times a day (BID) | ORAL | 4 refills | Status: DC

## 2021-07-16 NOTE — Patient Instructions (Signed)

## 2021-07-16 NOTE — Addendum Note (Signed)
Addended by: Beatrix Fetters on: 07/16/2021 12:19 PM   Modules accepted: Orders

## 2021-07-16 NOTE — Assessment & Plan Note (Signed)
History of essential hypertension a blood pressure measured today 122/58.  She is on losartan, hydrochlorothiazide and metoprolol.

## 2021-07-16 NOTE — Assessment & Plan Note (Signed)
Coronary CTA performed 01/01/2020 showed a hemodynamically significant lesion in the midportion of a nondominant circumflex.  She has been asymptomatic since that time.

## 2021-07-16 NOTE — Progress Notes (Signed)
07/16/2021 BEVERLYANN BROXTERMAN   1941/02/12  [id number]  Primary Physician Shon Baton, MD Primary Cardiologist: Lorretta Harp MD Garret Reddish, [place], [place]  HPI:  Emma [name] is a 81 y.o.  moderately overweight divorced Caucasian female mother of 2 children, grandmother of 5 grandchildren who I initially saw during her hospitalization a for chest pain.  Her primary provider is [name].    I last saw her in the office 07/21/2020. Risk factors include tobacco abuse having quit 2 years ago as well as treated hypertension.  Her father may have had a myocardial infarction.  She is never had a heart attack or stroke.  She is retired Airline pilot.  She did have a coronary CTA during her hospitalization 12/31/2018 that showed a significant mid nondominant circumflex lesion.  She said no recurrent chest pain   Since I saw her a year ago she continues to do well.  She denies chest pain or shortness of breath.  She did have COVID back in April of last year but this was mild.     Current Meds  Medication Sig   acetaminophen (TYLENOL) 500 MG tablet Take 500-1,900 mg by mouth every 6 (six) hours as needed for mild pain.    cetaphil (CETAPHIL) cream Apply 1 application topically daily as needed (dry skin).   cholecalciferol (VITAMIN D) 1000 units tablet Take 1,000 Units by mouth See admin instructions. Take 1 tablet daily except Sundays.   folic acid (FOLVITE) 1 MG tablet Take 1 mg by mouth daily.   furosemide (LASIX) 20 MG tablet Take 1 tablet (20 mg total) by mouth every Monday, Wednesday, and Friday. As needed   gabapentin (NEURONTIN) 300 MG capsule Take 300 mg by mouth at bedtime.   ibuprofen (ADVIL,MOTRIN) 200 MG tablet Take 200-400 mg by mouth every 6 (six) hours as needed for fever, headache or mild pain.    losartan-hydrochlorothiazide (HYZAAR) 100-12.5 MG tablet Take 1 tablet by mouth daily.   methotrexate (RHEUMATREX) 2.5 MG tablet Take 2 tablets by mouth once a week.     metoprolol tartrate (LOPRESSOR) 25 MG tablet Take 1/2 tablet (12.5mg ) by mouth twice daily.   nitrofurantoin, macrocrystal-monohydrate, (MACROBID) 100 MG capsule Take 1 capsule (100 mg total) by mouth 2 (two) times daily.   potassium chloride SA (KLOR-CON M20) 20 MEQ tablet Take 1 tablet (20 mEq total) by mouth 2 (two) times daily.   Propylene Glycol (SYSTANE BALANCE OP) Place 1 drop into both eyes 2 (two) times daily as needed (dry eyes).    Vitamin D, Ergocalciferol, (DRISDOL) 50000 UNITS CAPS Take 50,000 Units by mouth every Sunday.    WIXELA INHUB 250-50 MCG/DOSE AEPB Inhale 1 puff into the lungs daily.     Allergies  Allergen Reactions   Penicillins Hives, Rash and Other (See Comments)    PATIENT HAS HAD A PCN REACTION WITH IMMEDIATE RASH, FACIAL/TONGUE/THROAT SWELLING, SOB, OR LIGHTHEADEDNESS WITH HYPOTENSION:  #  #  #  YES  #  #  #   Has patient had a PCN reaction causing severe rash involving mucus membranes or skin necrosis: no Has patient had a PCN reaction that required hospitalization: no Has patient had a PCN reaction occurring within the last 10 years: #  #  #  YES  #  #  #     Moxifloxacin Other (See Comments)    Unknown      Social History   Socioeconomic History   Marital status:  Divorced    Spouse name: Not on file   Number of children: 2   Years of education: Not on file   Highest education level: Not on file  Occupational History   Occupation: Retired  Tobacco Use   Smoking status: Former    Packs/day: 0.25    Years: 50.00    Pack years: 12.50    Types: Cigarettes    Quit date: 08/11/2017    Years since quitting: 3.9   Smokeless tobacco: Never  Vaping Use   Vaping Use: Never used  Substance and Sexual Activity   Alcohol use: Yes    Alcohol/week: 2.0 standard drinks    Types: 2 Standard drinks or equivalent per week   Drug use: No   Sexual activity: Not Currently    Partners: Male    Birth control/protection: Post-menopausal  Other Topics Concern    Not on file  Social History Narrative   2 caffeine drinks daily    Social Determinants of Health   Financial Resource Strain: Not on file  Food Insecurity: Not on file  Transportation Needs: Not on file  Physical Activity: Not on file  Stress: Not on file  Social Connections: Not on file  Intimate Partner Violence: Not on file     Review of Systems: General: negative for chills, fever, night sweats or weight changes.  Cardiovascular: negative for chest pain, dyspnea on exertion, edema, orthopnea, palpitations, paroxysmal nocturnal dyspnea or shortness of breath Dermatological: negative for rash Respiratory: negative for cough or wheezing Urologic: negative for hematuria Abdominal: negative for nausea, vomiting, diarrhea, bright red blood per rectum, melena, or hematemesis Neurologic: negative for visual changes, syncope, or dizziness All other systems reviewed and are otherwise negative except as noted above.    Blood pressure (!) 122/58, pulse 68, height 5' (1.524 m), weight 146 lb (66.2 kg), last menstrual period 12/09/1997.  General appearance: alert and no distress Neck: no adenopathy, no carotid bruit, no JVD, supple, symmetrical, trachea midline, and thyroid not enlarged, symmetric, no tenderness/mass/nodules Lungs: clear to auscultation bilaterally Heart: regular rate and rhythm, S1, S2 normal, no murmur, click, rub or gallop Extremities: extremities normal, atraumatic, no cyanosis or edema Pulses: 2+ and symmetric Skin: Skin color, texture, turgor normal. No rashes or lesions Neurologic: Grossly normal  EKG sinus rhythm at 68 with LVH voltage.  I personally reviewed this EKG.  ASSESSMENT AND PLAN:   HTN (hypertension) History of essential hypertension a blood pressure measured today 122/58.  She is on losartan, hydrochlorothiazide and metoprolol.  Coronary artery disease Coronary CTA performed 01/01/2020 showed a hemodynamically significant lesion in the midportion  of a nondominant circumflex.  She has been asymptomatic since that time.     Lorretta Harp MD FACP,FACC,FAHA, [facility] 07/16/2021 11:44 AM

## 2021-09-28 DIAGNOSIS — H02831 Dermatochalasis of right upper eyelid: Secondary | ICD-10-CM | POA: Diagnosis not present

## 2021-09-28 DIAGNOSIS — H40013 Open angle with borderline findings, low risk, bilateral: Secondary | ICD-10-CM | POA: Diagnosis not present

## 2021-09-28 DIAGNOSIS — H1032 Unspecified acute conjunctivitis, left eye: Secondary | ICD-10-CM | POA: Diagnosis not present

## 2021-09-28 DIAGNOSIS — H02834 Dermatochalasis of left upper eyelid: Secondary | ICD-10-CM | POA: Diagnosis not present

## 2021-10-05 DIAGNOSIS — H10413 Chronic giant papillary conjunctivitis, bilateral: Secondary | ICD-10-CM | POA: Diagnosis not present

## 2021-10-05 DIAGNOSIS — H02834 Dermatochalasis of left upper eyelid: Secondary | ICD-10-CM | POA: Diagnosis not present

## 2021-10-05 DIAGNOSIS — H02831 Dermatochalasis of right upper eyelid: Secondary | ICD-10-CM | POA: Diagnosis not present

## 2021-10-05 DIAGNOSIS — H40013 Open angle with borderline findings, low risk, bilateral: Secondary | ICD-10-CM | POA: Diagnosis not present

## 2021-11-03 ENCOUNTER — Ambulatory Visit (INDEPENDENT_AMBULATORY_CARE_PROVIDER_SITE_OTHER): Payer: Medicare PPO

## 2021-11-03 ENCOUNTER — Ambulatory Visit: Payer: Medicare PPO | Admitting: Podiatry

## 2021-11-03 ENCOUNTER — Encounter: Payer: Self-pay | Admitting: Podiatry

## 2021-11-03 DIAGNOSIS — M722 Plantar fascial fibromatosis: Secondary | ICD-10-CM | POA: Diagnosis not present

## 2021-11-03 MED ORDER — TRIAMCINOLONE ACETONIDE 10 MG/ML IJ SUSP
20.0000 mg | Freq: Once | INTRAMUSCULAR | Status: AC
  Administered 2021-11-03: 20 mg

## 2021-11-03 NOTE — Progress Notes (Signed)
Subjective:  ? ?Patient ID: Emma [name], female   DOB: 81 y.o.   MRN: [id number]  ? ?HPI ?Patient presents with acute pain in the arches of both feet and states that she has had previous orthotics and probably will need a new pair.  States that the pain has been quite debilitating not allowing her to be active and she is referred here does not smoke would like to be more active ? ? ?Review of Systems  ?All other systems reviewed and are negative. ? ? ?   ?Objective:  ?Physical Exam ?Vitals and nursing note reviewed.  ?Constitutional:   ?   Appearance: She is well-developed.  ?Pulmonary:  ?   Effort: Pulmonary effort is normal.  ?Musculoskeletal:     ?   General: Normal range of motion.  ?Skin: ?   General: Skin is warm.  ?Neurological:  ?   Mental Status: She is alert.  ?  ?Neurovascular status found to be mildly diminished PT DP pulses sharp dull intact mild swelling around the ankles with exquisite discomfort in the mid arch area bilateral with inflammation fluid buildup.  Patient does have moderate depression of the arch history of orthotics that have not been successful in helping her and an increase in pain over the last 6 months.  Good digital perfusion well oriented x3 ? ?   ?Assessment:  ?Ability for acute mid arch Planter fasciitis bilateral with inability to stretch and orthotics which are not helping her during the acute phase ? ?   ?Plan:  ?H&P reviewed all conditions educating her on this and did sterile prep injected the mid arch area bilateral 3 mg Kenalog 5 mg Xylocaine and went ahead today and dispensed night splint that I want her to rotate between the right and left utilizing heat and cold therapy.  Patient may get new orthotics we will decide when I see her back in 1 month and see her response ? ?X-rays indicate there is significant depression of the arch with spur formation no indication stress fracture arthritis ?   ? ? ?

## 2021-11-05 ENCOUNTER — Telehealth: Payer: Self-pay | Admitting: *Deleted

## 2021-11-05 NOTE — Telephone Encounter (Signed)
Patient called and the night splint given is too small, will be coming in on Monday to exchange it out for a larger size. ?

## 2021-11-22 DIAGNOSIS — Z5181 Encounter for therapeutic drug level monitoring: Secondary | ICD-10-CM | POA: Diagnosis not present

## 2021-11-22 DIAGNOSIS — L658 Other specified nonscarring hair loss: Secondary | ICD-10-CM | POA: Diagnosis not present

## 2021-11-22 DIAGNOSIS — L2084 Intrinsic (allergic) eczema: Secondary | ICD-10-CM | POA: Diagnosis not present

## 2021-11-22 DIAGNOSIS — L299 Pruritus, unspecified: Secondary | ICD-10-CM | POA: Diagnosis not present

## 2021-11-22 DIAGNOSIS — L309 Dermatitis, unspecified: Secondary | ICD-10-CM | POA: Diagnosis not present

## 2021-12-01 ENCOUNTER — Ambulatory Visit: Payer: Medicare PPO | Admitting: Podiatry

## 2021-12-01 ENCOUNTER — Encounter: Payer: Self-pay | Admitting: Podiatry

## 2021-12-01 DIAGNOSIS — M722 Plantar fascial fibromatosis: Secondary | ICD-10-CM | POA: Diagnosis not present

## 2021-12-01 DIAGNOSIS — R600 Localized edema: Secondary | ICD-10-CM

## 2021-12-01 NOTE — Progress Notes (Signed)
Subjective:   Patient ID: Emma [name], female   DOB: 81 y.o.   MRN: [id number]   HPI Patient presents stating she is still getting pain in her arches and admits she did not use the night splint as we have instructed   ROS      Objective:  Physical Exam  Neurovascular status intact with patient's mid arch area bilateral found to be inflamed with pain with palpation with patient noted to have night splint that she has not been using properly     Assessment:  Chronic fasciitis that unfortunately did not respond well to injection still painful but not using so far her splint     Plan:  H&P reviewed condition recommended night splint to be used during the day and evening but not to sleep with and to use it for each foot along with heat ice along with the consideration at 1 point long-term for shockwave therapy PRP or some other more esoteric treatment if we cannot get this somewhat better but I am hopeful with proper night splint usage she will respond.  All questions answered over good period of time

## 2021-12-13 DIAGNOSIS — R739 Hyperglycemia, unspecified: Secondary | ICD-10-CM | POA: Diagnosis not present

## 2021-12-13 DIAGNOSIS — I10 Essential (primary) hypertension: Secondary | ICD-10-CM | POA: Diagnosis not present

## 2021-12-13 DIAGNOSIS — E559 Vitamin D deficiency, unspecified: Secondary | ICD-10-CM | POA: Diagnosis not present

## 2021-12-13 DIAGNOSIS — E042 Nontoxic multinodular goiter: Secondary | ICD-10-CM | POA: Diagnosis not present

## 2021-12-13 DIAGNOSIS — R7989 Other specified abnormal findings of blood chemistry: Secondary | ICD-10-CM | POA: Diagnosis not present

## 2021-12-13 DIAGNOSIS — Z0000 Encounter for general adult medical examination without abnormal findings: Secondary | ICD-10-CM | POA: Diagnosis not present

## 2021-12-13 NOTE — Progress Notes (Signed)
81 y.o. G84P2002 Divorced White or Caucasian [name] here for annual exam.  No vaginal bleeding.   She is having pain in her groin. The pain started ~5 days ago, new exercises. She has some exercises that seemed to help.    H/O mixed incontinence, stable and tolerable. No bowel c/o, she is on a fiber supplement.   Patient's last menstrual period was 12/09/1997 (approximate).          Sexually active: No.  The current method of family planning is post menopausal status.    Exercising: Yes.    Gym/ health club routine includes low impact aerobics and swimming. 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 Discussed treatment, she declines  Colonoscopy: 01-29-14 polyps, was told no more colonoscopies. TDaP:  2016  Gardasil: n/a   reports that she quit smoking about 4 years ago. Her smoking use included cigarettes. She has a 12.50 pack-year smoking history. She has never used smokeless tobacco. She reports current alcohol use of about 2.0 standard drinks per week. She reports that she does not use drugs. 2 kids, both local. 5 grand children.  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 pt. unsure of pathology   Carpal tunnel syndrome    Cataract    Condyloma    Constipation    Hx of adenomatous colonic polyps    Hypertension    Hypothyroidism    2016- synthroid d/c'd    Osteoarthritis    OA- knee- L knee, R thumb   Osteopenia    Thyroid disease    Vitamin D deficiency     Past Surgical History:  Procedure Laterality Date   BELPHAROPTOSIS REPAIR Bilateral 04/2015   vision correction   BREAST BIOPSY     right    CATARACT EXTRACTION, BILATERAL  Bilateral 01/2014, 02/2014   /w IOL   CESAREAN SECTION     x 2    CHOLECYSTECTOMY     EYE SURGERY     GALLBLADDER SURGERY     IR RADIOLOGY PERIPHERAL GUIDED IV START  01/01/2019   IR US GUIDE VASC ACCESS RIGHT  01/01/2019   LAPAROSCOPIC APPENDECTOMY N/A 05/30/2018   Procedure: APPENDECTOMY LAPAROSCOPIC;  Surgeon: Leighton Ruff, MD;  Location: [facility];  Service: General;  Laterality: N/A;   SHOULDER SURGERY  2006   right    SHOULDER SURGERY Left 07/17/2013    bone spurs, and partial tear of rotator cuff   TOTAL KNEE ARTHROPLASTY Left 08/14/2017   Procedure: LEFT TOTAL KNEE ARTHROPLASTY;  Surgeon: Vickey Huger, MD;  Location: [facility];  Service: Orthopedics;  Laterality: Left;   TUBAL LIGATION      Current Outpatient Medications  Medication Sig Dispense Refill   acetaminophen (TYLENOL) 500 MG tablet Take 500-1,900 mg by mouth every 6 (six) hours as needed for mild pain.      cetaphil (CETAPHIL) cream Apply 1 application topically daily as needed (dry skin).     cholecalciferol (VITAMIN D) 1000 units tablet Take 1,000 Units by mouth See admin instructions. Take 1 tablet daily except Sundays.  folic acid (FOLVITE) 1 MG tablet Take 1 mg by mouth daily.     furosemide (LASIX) 20 MG tablet Take 1 tablet (20 mg total) by mouth every Monday, Wednesday, and Friday. As needed 45 tablet 3   gabapentin (NEURONTIN) 300 MG capsule Take 300 mg by mouth at bedtime.     ibuprofen (ADVIL,MOTRIN) 200 MG tablet Take 200-400 mg by mouth every 6 (six) hours as needed for fever, headache or mild pain.      losartan-hydrochlorothiazide (HYZAAR) 100-12.5 MG tablet Take 1 tablet by mouth daily. 90 tablet 4   methotrexate (RHEUMATREX) 2.5 MG tablet Take 2 tablets by mouth once a week.      metoprolol tartrate (LOPRESSOR) 25 MG tablet Take 1/2 tablet (12.'5mg'$ ) by mouth twice daily. 90 tablet 4   nitrofurantoin, macrocrystal-monohydrate, (MACROBID) 100 MG capsule Take 1 capsule (100 mg total) by mouth 2 (two) times  daily. 10 capsule 0   potassium chloride SA (KLOR-CON M20) 20 MEQ tablet Take 1 tablet (20 mEq total) by mouth 2 (two) times daily. 180 tablet 4   Propylene Glycol (SYSTANE BALANCE OP) Place 1 drop into both eyes 2 (two) times daily as needed (dry eyes).      Vitamin D, Ergocalciferol, (DRISDOL) 50000 UNITS CAPS Take 50,000 Units by mouth every Sunday.      WIXELA INHUB 250-50 MCG/DOSE AEPB Inhale 1 puff into the lungs daily.     No current facility-administered medications for this visit.    Family History  Problem Relation Age of Onset   Heart disease Father    Diabetes Paternal Grandfather    Asthma Son    Colon cancer Neg Hx     Review of Systems  All other systems reviewed and are negative.   Exam:   LMP 12/09/1997 (Approximate)   Weight change: '@WEIGHTCHANGE'$ @ Height:      Ht Readings from Last 3 Encounters:  07/16/21 5' (1.524 m)  02/09/21 '4\' 11"'$  (1.499 m)  12/18/20 '4\' 11"'$  (1.499 m)    General appearance: alert, cooperative and appears stated age Head: Normocephalic, without obvious abnormality, atraumatic Neck: no adenopathy, supple, symmetrical, trachea midline and thyroid normal to inspection and palpation Breasts: normal appearance, no masses or tenderness Abdomen: soft, non-tender; non distended,  no masses,  no organomegaly Extremities: extremities normal, atraumatic, no cyanosis or edema Skin: Skin color, texture, turgor normal. No rashes or lesions Lymph nodes: Cervical, supraclavicular, and axillary nodes normal. No abnormal inguinal nodes palpated Neurologic: Grossly normal   Pelvic: External genitalia:  no lesions              Urethra:  normal appearing urethra with no masses, tenderness or lesions              Bartholins and Skenes: normal                 Vagina: normal appearing vagina with normal color and discharge, no lesions              Cervix: no lesions               Bimanual Exam:  Uterus:  normal size, contour, position, consistency,  mobility, non-tender              Adnexa: no mass, fullness, tenderness               Rectovaginal: Confirms               Anus:  normal sphincter tone, no lesions  Gae Dry, CMA chaperoned for the exam.  1. Encounter for breast and pelvic examination Discussed breast self exam Mammogram is scheduled No more colonoscopies Labs from primary were reviewed and will be scanned.   2. Age-related osteoporosis without current pathological fracture Discussed calcium and vit D intake She is due for a DEXA at [facility] in 7/24, will put in recall so order gets placed Discussed weight bearing exercise, working on balance, decrease her risk of falls Discussed that   3. Mixed incontinence Stable and tolerable

## 2021-12-20 ENCOUNTER — Encounter: Payer: Self-pay | Admitting: Obstetrics and Gynecology

## 2021-12-20 ENCOUNTER — Ambulatory Visit (INDEPENDENT_AMBULATORY_CARE_PROVIDER_SITE_OTHER): Payer: Medicare PPO | Admitting: Obstetrics and Gynecology

## 2021-12-20 VITALS — BP 118/68 | HR 77 | Ht 60.0 in | Wt 145.0 lb

## 2021-12-20 DIAGNOSIS — D649 Anemia, unspecified: Secondary | ICD-10-CM | POA: Diagnosis not present

## 2021-12-20 DIAGNOSIS — Z7969 Long term (current) use of other immunomodulators and immunosuppressants: Secondary | ICD-10-CM

## 2021-12-20 DIAGNOSIS — Z9289 Personal history of other medical treatment: Secondary | ICD-10-CM | POA: Diagnosis not present

## 2021-12-20 DIAGNOSIS — I10 Essential (primary) hypertension: Secondary | ICD-10-CM | POA: Diagnosis not present

## 2021-12-20 DIAGNOSIS — M810 Age-related osteoporosis without current pathological fracture: Secondary | ICD-10-CM

## 2021-12-20 DIAGNOSIS — L659 Nonscarring hair loss, unspecified: Secondary | ICD-10-CM | POA: Insufficient documentation

## 2021-12-20 DIAGNOSIS — N3946 Mixed incontinence: Secondary | ICD-10-CM

## 2021-12-20 DIAGNOSIS — D692 Other nonthrombocytopenic purpura: Secondary | ICD-10-CM | POA: Diagnosis not present

## 2021-12-20 DIAGNOSIS — I872 Venous insufficiency (chronic) (peripheral): Secondary | ICD-10-CM | POA: Insufficient documentation

## 2021-12-20 DIAGNOSIS — M79673 Pain in unspecified foot: Secondary | ICD-10-CM | POA: Diagnosis not present

## 2021-12-20 DIAGNOSIS — J449 Chronic obstructive pulmonary disease, unspecified: Secondary | ICD-10-CM | POA: Diagnosis not present

## 2021-12-20 DIAGNOSIS — R82998 Other abnormal findings in urine: Secondary | ICD-10-CM | POA: Diagnosis not present

## 2021-12-20 DIAGNOSIS — Z0000 Encounter for general adult medical examination without abnormal findings: Secondary | ICD-10-CM | POA: Diagnosis not present

## 2021-12-20 DIAGNOSIS — I700 Atherosclerosis of aorta: Secondary | ICD-10-CM | POA: Diagnosis not present

## 2021-12-20 DIAGNOSIS — Z23 Encounter for immunization: Secondary | ICD-10-CM | POA: Diagnosis not present

## 2021-12-20 DIAGNOSIS — M8580 Other specified disorders of bone density and structure, unspecified site: Secondary | ICD-10-CM | POA: Diagnosis not present

## 2021-12-20 DIAGNOSIS — Z01419 Encounter for gynecological examination (general) (routine) without abnormal findings: Secondary | ICD-10-CM

## 2021-12-20 NOTE — Patient Instructions (Signed)
Adductor Muscle Strain  An adductor muscle strain, also called a groin strain or pull, is an injury to the muscles or tendons on the upper, inner part of the thigh. These muscles are called the adductor or groin muscles. They are responsible for moving the legs across the body or pulling the legs together. A muscle strain occurs when a muscle is overstretched and some muscle fibers are torn. The severity of an adductor muscle strain is rated as Grade 1, 2, or 3. A Grade 3 strain has the most tearing and pain. What are the causes? Adductor muscle strains usually occur during exercise or while participating in sports. This condition may be caused by: A sudden, violent force placed on the muscle, stretching it too far. Stretching the muscles too far or too suddenly, often during side-to-side motion with a sudden change in direction. Putting repeated stress on the adductor muscles over a long period of time. Performing vigorous activity without properly stretching or warming up the adductor muscles beforehand. Not being properly conditioned. What are the signs or symptoms? Symptoms of this condition include: Pain and tenderness in the groin area. This begins as sharp pain and persists as a dull ache. A popping or snapping feeling when the injury occurs (for severe strains). Swelling or bruising. Muscle spasms. Weakness in the leg. Stiffness in the groin area with decreased ability to move the affected muscles. How is this diagnosed? This condition may be diagnosed based on: A physical exam. Your medical history. How well you can do certain range of motion exercises. Imaging tests, such as MRI, ultrasound, or X-rays. Your strain may be rated based on how severe it is. The ratings are: Grade 1 strain (mild). Muscles are overstretched. There may be very small muscle tears. This type of strain generally heals in about one week. Grade 2 strain (moderate). Muscles are partially torn. This may take  one to two months to heal. Grade 3 strain (severe). Muscles are completely torn. A severe strain can take more than three months to heal. Grade 3 gluteal strains are rare. How is this treated? An adductor strain will often heal on its own. If needed, this condition may be treated with: PRICE therapy. PRICE stands for protection of the injured area, rest, ice, pressure (compression), and elevation. Medicines to help manage pain and swelling (anti-inflammatory medicines). Crutches. You may be directed to use these for the first few days to minimize your pain. Depending on the severity of the muscle strain, recovery time may vary from a few weeks to several months. Severe injuries often require 4-6 weeks for recovery. In those cases, complete healing can take 4-5 months. Follow these instructions at home: PRICE Therapy  Protect the muscle from being injured again. Rest. Do not use the strained muscle if it causes pain. If directed, put ice on the injured area: Put ice in a plastic bag. Place a towel between your skin and the bag. Leave the ice on for 20 minutes, 2-3 times a day. Do this for the first 2 days after the injury. Apply compression by wrapping the injured area with an elastic bandage as told by your health care provider. Raise (elevate) the injured area above the level of your heart while you are sitting or lying down. Activity Do not drive or use heavy machinery while taking prescription pain medicine. Walk, stretch, and do exercises as told by your health care provider. Only do these activities if you can do so without any pain. General instructions   Take over-the-counter and prescription medicines only as told by your health care provider. Follow your treatment plan as told by your health care provider. This may include: Physical therapy. Massage. Local electrical stimulation (transcutaneous electrical nerve stimulation, TENS). Keep all follow-up visits. This is important. How  is this prevented? Warm up and stretch before being active. Cool down and stretch after being active. Give your body time to rest between periods of activity. Make sure to use equipment that fits you. Be safe and responsible while being active to avoid slips and falls. Maintain physical fitness, including: Proper conditioning in the adductor muscles. Overall strength, flexibility, and endurance. Contact a health care provider if: You have increased pain or swelling in the affected area. Your symptoms are not improving or they are getting worse. Summary An adductor muscle strain, also called a groin strain or pull, is an injury to the muscles or tendons on the upper, inner part of the thigh. A muscle strain occurs when a muscle is overstretched and some muscle fibers are torn. Depending on the severity of the muscle strain, recovery time may vary from a few weeks to several months. This information is not intended to replace advice given to you by your health care provider. Make sure you discuss any questions you have with your health care provider. Document Revised: 11/25/2020 Document Reviewed: 11/25/2020 Elsevier Patient Education  2023 Elsevier Inc.  

## 2021-12-21 DIAGNOSIS — Z1231 Encounter for screening mammogram for malignant neoplasm of breast: Secondary | ICD-10-CM | POA: Diagnosis not present

## 2021-12-23 ENCOUNTER — Encounter: Payer: Self-pay | Admitting: Obstetrics and Gynecology

## 2022-01-06 DIAGNOSIS — Z96652 Presence of left artificial knee joint: Secondary | ICD-10-CM | POA: Diagnosis not present

## 2022-01-12 ENCOUNTER — Ambulatory Visit: Payer: Medicare PPO | Admitting: Podiatry

## 2022-01-21 DIAGNOSIS — M25552 Pain in left hip: Secondary | ICD-10-CM | POA: Diagnosis not present

## 2022-03-07 DIAGNOSIS — M1612 Unilateral primary osteoarthritis, left hip: Secondary | ICD-10-CM | POA: Diagnosis not present

## 2022-03-16 DIAGNOSIS — L659 Nonscarring hair loss, unspecified: Secondary | ICD-10-CM | POA: Diagnosis not present

## 2022-03-16 DIAGNOSIS — L570 Actinic keratosis: Secondary | ICD-10-CM | POA: Diagnosis not present

## 2022-03-16 DIAGNOSIS — D692 Other nonthrombocytopenic purpura: Secondary | ICD-10-CM | POA: Diagnosis not present

## 2022-03-16 DIAGNOSIS — D2362 Other benign neoplasm of skin of left upper limb, including shoulder: Secondary | ICD-10-CM | POA: Diagnosis not present

## 2022-03-16 DIAGNOSIS — D1801 Hemangioma of skin and subcutaneous tissue: Secondary | ICD-10-CM | POA: Diagnosis not present

## 2022-03-16 DIAGNOSIS — L821 Other seborrheic keratosis: Secondary | ICD-10-CM | POA: Diagnosis not present

## 2022-04-06 DIAGNOSIS — H40013 Open angle with borderline findings, low risk, bilateral: Secondary | ICD-10-CM | POA: Diagnosis not present

## 2022-04-06 DIAGNOSIS — H52203 Unspecified astigmatism, bilateral: Secondary | ICD-10-CM | POA: Diagnosis not present

## 2022-04-06 DIAGNOSIS — Z961 Presence of intraocular lens: Secondary | ICD-10-CM | POA: Diagnosis not present

## 2022-05-25 DIAGNOSIS — M1612 Unilateral primary osteoarthritis, left hip: Secondary | ICD-10-CM | POA: Diagnosis not present

## 2022-05-25 DIAGNOSIS — M25552 Pain in left hip: Secondary | ICD-10-CM | POA: Diagnosis not present

## 2022-06-01 DIAGNOSIS — M25552 Pain in left hip: Secondary | ICD-10-CM | POA: Diagnosis not present

## 2022-06-16 DIAGNOSIS — I10 Essential (primary) hypertension: Secondary | ICD-10-CM | POA: Diagnosis not present

## 2022-06-16 DIAGNOSIS — M8580 Other specified disorders of bone density and structure, unspecified site: Secondary | ICD-10-CM | POA: Diagnosis not present

## 2022-06-16 DIAGNOSIS — J449 Chronic obstructive pulmonary disease, unspecified: Secondary | ICD-10-CM | POA: Diagnosis not present

## 2022-06-16 DIAGNOSIS — D692 Other nonthrombocytopenic purpura: Secondary | ICD-10-CM | POA: Diagnosis not present

## 2022-06-16 DIAGNOSIS — D8989 Other specified disorders involving the immune mechanism, not elsewhere classified: Secondary | ICD-10-CM | POA: Diagnosis not present

## 2022-06-16 DIAGNOSIS — J45909 Unspecified asthma, uncomplicated: Secondary | ICD-10-CM | POA: Diagnosis not present

## 2022-06-16 DIAGNOSIS — Z79899 Other long term (current) drug therapy: Secondary | ICD-10-CM | POA: Diagnosis not present

## 2022-06-16 DIAGNOSIS — L509 Urticaria, unspecified: Secondary | ICD-10-CM | POA: Diagnosis not present

## 2022-06-16 DIAGNOSIS — I700 Atherosclerosis of aorta: Secondary | ICD-10-CM | POA: Diagnosis not present

## 2022-06-24 DIAGNOSIS — Z23 Encounter for immunization: Secondary | ICD-10-CM | POA: Diagnosis not present

## 2022-06-24 DIAGNOSIS — S61411A Laceration without foreign body of right hand, initial encounter: Secondary | ICD-10-CM | POA: Diagnosis not present

## 2022-06-24 DIAGNOSIS — M79641 Pain in right hand: Secondary | ICD-10-CM | POA: Diagnosis not present

## 2022-06-27 DIAGNOSIS — Z5189 Encounter for other specified aftercare: Secondary | ICD-10-CM | POA: Diagnosis not present

## 2022-06-30 DIAGNOSIS — L309 Dermatitis, unspecified: Secondary | ICD-10-CM | POA: Diagnosis not present

## 2022-06-30 DIAGNOSIS — L650 Telogen effluvium: Secondary | ICD-10-CM | POA: Diagnosis not present

## 2022-06-30 DIAGNOSIS — Z79899 Other long term (current) drug therapy: Secondary | ICD-10-CM | POA: Diagnosis not present

## 2022-06-30 DIAGNOSIS — L299 Pruritus, unspecified: Secondary | ICD-10-CM | POA: Diagnosis not present

## 2022-06-30 DIAGNOSIS — L2084 Intrinsic (allergic) eczema: Secondary | ICD-10-CM | POA: Diagnosis not present

## 2022-07-01 DIAGNOSIS — R7989 Other specified abnormal findings of blood chemistry: Secondary | ICD-10-CM | POA: Diagnosis not present

## 2022-07-01 DIAGNOSIS — E042 Nontoxic multinodular goiter: Secondary | ICD-10-CM | POA: Diagnosis not present

## 2022-07-01 DIAGNOSIS — I10 Essential (primary) hypertension: Secondary | ICD-10-CM | POA: Diagnosis not present

## 2022-07-01 DIAGNOSIS — L659 Nonscarring hair loss, unspecified: Secondary | ICD-10-CM | POA: Diagnosis not present

## 2022-07-08 DIAGNOSIS — Z96652 Presence of left artificial knee joint: Secondary | ICD-10-CM | POA: Diagnosis not present

## 2022-07-08 DIAGNOSIS — M25552 Pain in left hip: Secondary | ICD-10-CM | POA: Diagnosis not present

## 2022-07-08 DIAGNOSIS — M25562 Pain in left knee: Secondary | ICD-10-CM | POA: Diagnosis not present

## 2022-07-20 ENCOUNTER — Encounter: Payer: Self-pay | Admitting: Cardiovascular Disease

## 2022-07-20 ENCOUNTER — Ambulatory Visit: Payer: Medicare PPO | Attending: Cardiovascular Disease | Admitting: Cardiovascular Disease

## 2022-07-20 VITALS — BP 140/58 | HR 56 | Ht 60.0 in | Wt 143.0 lb

## 2022-07-20 DIAGNOSIS — I2510 Atherosclerotic heart disease of native coronary artery without angina pectoris: Secondary | ICD-10-CM

## 2022-07-20 DIAGNOSIS — I10 Essential (primary) hypertension: Secondary | ICD-10-CM

## 2022-07-20 DIAGNOSIS — E782 Mixed hyperlipidemia: Secondary | ICD-10-CM | POA: Diagnosis not present

## 2022-07-20 DIAGNOSIS — E785 Hyperlipidemia, unspecified: Secondary | ICD-10-CM | POA: Insufficient documentation

## 2022-07-20 NOTE — Assessment & Plan Note (Signed)
History of mild hyperlipidemia currently not on statin therapy with lipid profile performed 12/13/2021 revealing total cholesterol 186, LDL 110 and HDL of 64, not at goal for secondary prevention.  We will recheck a lipid liver profile.

## 2022-07-20 NOTE — Patient Instructions (Signed)
  Lab Work: Your physician recommends that you return for lab work FASTING  If you have labs (blood work) drawn today and your tests are completely normal, you will receive your results only by: [place] (if you have [place]) OR A paper copy in the mail If you have any lab test that is abnormal or we need to change your treatment, we will call you to review the results.   Follow-Up: At [facility], you and your health needs are our priority.  As part of our continuing mission to provide you with exceptional heart care, we have created designated Provider Care Teams.  These Care Teams include your primary Cardiologist (physician) and Advanced Practice Providers (APPs -  Physician Assistants and Nurse Practitioners) who all work together to provide you with the care you need, when you need it.  We recommend signing up for the patient portal called "MyChart".  Sign up information is provided on this After Visit Summary.  MyChart is used to connect with patients for Virtual Visits (Telemedicine).  Patients are able to view lab/test results, encounter notes, upcoming appointments, etc.  Non-urgent messages can be sent to your provider as well.   To learn more about what you can do with MyChart, go to [email or web address].    Your next appointment:   12 month(s)  The format for your next appointment:   In Person  Provider:   Quay Burow, MD

## 2022-07-20 NOTE — Progress Notes (Signed)
07/20/2022 Emma [name]   1940/09/15  [id number]  Primary Physician Shon Baton, MD Primary Cardiologist: Lorretta Harp MD Garret Reddish, [place], [place]  HPI:  Emma [name] is a 82 y.o.   moderately overweight divorced Caucasian female mother of 2 children, grandmother of 5 grandchildren who I initially saw during her hospitalization a for chest pain.  Her primary provider is [name].   I last saw her in the office 07/16/2021. Risk factors include tobacco abuse having quit 2 years ago as well as treated hypertension.  Her father may have had a myocardial infarction.  She is never had a heart attack or stroke.  She is retired Airline pilot.  She did have a coronary CTA during her hospitalization 12/31/2018 that showed a significant mid nondominant circumflex lesion.  Her coronary calcium score was 352.  She said no recurrent chest pain   Since I saw her a year ago she continues to do well.  She denies chest pain or shortness of breath.  She apparently has had some back and knee pain and has seen an orthopedic surgeon for this.  She specifically denies chest pain or shortness of breath.  Current Meds  Medication Sig   acetaminophen (TYLENOL) 500 MG tablet Take 500-1,900 mg by mouth every 6 (six) hours as needed for mild pain.    cetaphil (CETAPHIL) cream Apply 1 application topically daily as needed (dry skin).   cholecalciferol (VITAMIN D) 1000 units tablet Take 1,000 Units by mouth See admin instructions. Take 1 tablet daily except Sundays.   folic acid (FOLVITE) 1 MG tablet Take 1 mg by mouth daily.   furosemide (LASIX) 20 MG tablet Take 1 tablet (20 mg total) by mouth every Monday, Wednesday, and Friday. As needed   gabapentin (NEURONTIN) 300 MG capsule Take 300 mg by mouth at bedtime.   ibuprofen (ADVIL,MOTRIN) 200 MG tablet Take 200-400 mg by mouth every 6 (six) hours as needed for fever, headache or mild pain.    losartan-hydrochlorothiazide (HYZAAR) 100-12.5 MG tablet  Take 1 tablet by mouth daily.   methotrexate (RHEUMATREX) 2.5 MG tablet Take 1 tablet by mouth once a week.   metoprolol tartrate (LOPRESSOR) 25 MG tablet Take 1/2 tablet (12.'5mg'$ ) by mouth twice daily.   potassium chloride SA (KLOR-CON M20) 20 MEQ tablet Take 1 tablet (20 mEq total) by mouth 2 (two) times daily.   Propylene Glycol (SYSTANE BALANCE OP) Place 1 drop into both eyes 2 (two) times daily as needed (dry eyes).    Vitamin D, Ergocalciferol, (DRISDOL) 50000 UNITS CAPS Take 50,000 Units by mouth every Sunday.    WIXELA INHUB 250-50 MCG/DOSE AEPB Inhale 1 puff into the lungs daily.     Allergies  Allergen Reactions   Penicillins Hives, Rash and Other (See Comments)    PATIENT HAS HAD A PCN REACTION WITH IMMEDIATE RASH, FACIAL/TONGUE/THROAT SWELLING, SOB, OR LIGHTHEADEDNESS WITH HYPOTENSION:  #  #  #  YES  #  #  #   Has patient had a PCN reaction causing severe rash involving mucus membranes or skin necrosis: no Has patient had a PCN reaction that required hospitalization: no Has patient had a PCN reaction occurring within the last 10 years: #  #  #  YES  #  #  #     Moxifloxacin Other (See Comments)    Unknown      Social History   Socioeconomic History   Marital status: Divorced  Spouse name: Not on file   Number of children: 2   Years of education: Not on file   Highest education level: Not on file  Occupational History   Occupation: Retired  Tobacco Use   Smoking status: Former    Packs/day: 0.25    Years: 50.00    Total pack years: 12.50    Types: Cigarettes    Quit date: 08/11/2017    Years since quitting: 4.9   Smokeless tobacco: Never  Vaping Use   Vaping Use: Never used  Substance and Sexual Activity   Alcohol use: Yes    Alcohol/week: 2.0 standard drinks of alcohol    Types: 2 Standard drinks or equivalent per week   Drug use: No   Sexual activity: Not Currently    Partners: Male    Birth control/protection: Post-menopausal  Other Topics Concern   Not  on file  Social History Narrative   2 caffeine drinks daily    Social Determinants of Health   Financial Resource Strain: Not on file  Food Insecurity: Not on file  Transportation Needs: Not on file  Physical Activity: Not on file  Stress: Not on file  Social Connections: Not on file  Intimate Partner Violence: Not on file     Review of Systems: General: negative for chills, fever, night sweats or weight changes.  Cardiovascular: negative for chest pain, dyspnea on exertion, edema, orthopnea, palpitations, paroxysmal nocturnal dyspnea or shortness of breath Dermatological: negative for rash Respiratory: negative for cough or wheezing Urologic: negative for hematuria Abdominal: negative for nausea, vomiting, diarrhea, bright red blood per rectum, melena, or hematemesis Neurologic: negative for visual changes, syncope, or dizziness All other systems reviewed and are otherwise negative except as noted above.    Blood pressure (!) 140/58, pulse (!) 56, height 5' (1.524 m), weight 143 lb (64.9 kg), last menstrual period 12/09/1997.  General appearance: alert and no distress Neck: no adenopathy, no carotid bruit, no JVD, supple, symmetrical, trachea midline, and thyroid not enlarged, symmetric, no tenderness/mass/nodules Lungs: clear to auscultation bilaterally Heart: regular rate and rhythm, S1, S2 normal, no murmur, click, rub or gallop Extremities: extremities normal, atraumatic, no cyanosis or edema Pulses: 2+ and symmetric Skin: Skin color, texture, turgor normal. No rashes or lesions Neurologic: Grossly normal  EKG sinus bradycardia at 56 with LVH voltage.  I personally reviewed this EKG.  ASSESSMENT AND PLAN:   HTN (hypertension) History of essential hypertension blood pressure measured today at 140/58.  She is on losartan/hydrochlorothiazide and metoprolol.  Hyperlipidemia History of mild hyperlipidemia currently not on statin therapy with lipid profile performed  12/13/2021 revealing total cholesterol 186, LDL 110 and HDL of 64, not at goal for secondary prevention.  We will recheck a lipid liver profile.  Coronary artery disease History of coronary CTA performed 01/01/2019 revealing a coronary calcium score 352 with significant disease in the mid nondominant circumflex although she is completely asymptomatic.     Lorretta Harp MD FACP,FACC,FAHA, [facility] 07/20/2022 10:34 AM

## 2022-07-20 NOTE — Assessment & Plan Note (Signed)
History of coronary CTA performed 01/01/2019 revealing a coronary calcium score 352 with significant disease in the mid nondominant circumflex although she is completely asymptomatic.

## 2022-07-20 NOTE — Assessment & Plan Note (Signed)
History of essential hypertension blood pressure measured today at 140/58.  She is on losartan/hydrochlorothiazide and metoprolol.

## 2022-07-21 DIAGNOSIS — M545 Low back pain, unspecified: Secondary | ICD-10-CM | POA: Diagnosis not present

## 2022-07-21 DIAGNOSIS — M25562 Pain in left knee: Secondary | ICD-10-CM | POA: Diagnosis not present

## 2022-07-29 DIAGNOSIS — I2510 Atherosclerotic heart disease of native coronary artery without angina pectoris: Secondary | ICD-10-CM | POA: Diagnosis not present

## 2022-07-29 LAB — HEPATIC FUNCTION PANEL
ALT: 21 IU/L (ref 0–32)
AST: 19 IU/L (ref 0–40)
Albumin: 4.1 g/dL (ref 3.7–4.7)
Alkaline Phosphatase: 116 IU/L (ref 44–121)
Bilirubin Total: 0.4 mg/dL (ref 0.0–1.2)
Bilirubin, Direct: 0.12 mg/dL (ref 0.00–0.40)
Total Protein: 6.3 g/dL (ref 6.0–8.5)

## 2022-07-29 LAB — LIPID PANEL
Chol/HDL Ratio: 2.5 ratio (ref 0.0–4.4)
Cholesterol, Total: 212 mg/dL — ABNORMAL HIGH (ref 100–199)
HDL: 86 mg/dL (ref >39)
LDL Chol Calc (NIH): 117 mg/dL — ABNORMAL HIGH (ref 0–99)
Triglycerides: 51 mg/dL (ref 0–149)
VLDL Cholesterol Cal: 9 mg/dL (ref 5–40)

## 2022-08-01 ENCOUNTER — Other Ambulatory Visit: Payer: Self-pay | Admitting: Cardiovascular Disease

## 2022-08-02 ENCOUNTER — Telehealth: Payer: Self-pay | Admitting: Cardiovascular Disease

## 2022-08-02 ENCOUNTER — Other Ambulatory Visit: Payer: Self-pay

## 2022-08-02 DIAGNOSIS — Z79899 Other long term (current) drug therapy: Secondary | ICD-10-CM

## 2022-08-02 MED ORDER — METOPROLOL TARTRATE 25 MG PO TABS: ORAL_TABLET | ORAL | 4 refills | Status: DC

## 2022-08-02 MED ORDER — ROSUVASTATIN CALCIUM 5 MG PO TABS: ORAL_TABLET | ORAL | 2 refills | Status: DC

## 2022-08-02 NOTE — Telephone Encounter (Signed)
Spoke with patient who needs refills on metoprolol tartrated and new orders for Crestor '5mg'$  every other day, as well as lipids in 3 months. Lab slip mailed to patient.

## 2022-08-02 NOTE — Telephone Encounter (Signed)
Pt c/o medication issue:  1. Name of Medication:   metoprolol tartrate (LOPRESSOR) 25 MG tablet  2. How are you currently taking this medication (dosage and times per day)? As prescribed  3. Are you having a reaction (difficulty breathing--STAT)?   4. What is your medication issue?    Patient would like a call back to discuss if she should still take the metoprolol if she is to be prescribed Crestor.  Patient wants to know if she should take them both medications at the same time.  Patient noted she has 4 days left of metoprolol and prescriptions should be sent to [facility], [address]

## 2022-08-04 DIAGNOSIS — M25562 Pain in left knee: Secondary | ICD-10-CM | POA: Diagnosis not present

## 2022-08-04 DIAGNOSIS — M545 Low back pain, unspecified: Secondary | ICD-10-CM | POA: Diagnosis not present

## 2022-08-18 DIAGNOSIS — M25562 Pain in left knee: Secondary | ICD-10-CM | POA: Diagnosis not present

## 2022-08-18 DIAGNOSIS — M545 Low back pain, unspecified: Secondary | ICD-10-CM | POA: Diagnosis not present

## 2022-09-03 ENCOUNTER — Other Ambulatory Visit: Payer: Self-pay | Admitting: Cardiovascular Disease

## 2022-09-07 DIAGNOSIS — M545 Low back pain, unspecified: Secondary | ICD-10-CM | POA: Diagnosis not present

## 2022-09-07 DIAGNOSIS — M25562 Pain in left knee: Secondary | ICD-10-CM | POA: Diagnosis not present

## 2022-09-19 DIAGNOSIS — M25562 Pain in left knee: Secondary | ICD-10-CM | POA: Diagnosis not present

## 2022-09-19 DIAGNOSIS — M545 Low back pain, unspecified: Secondary | ICD-10-CM | POA: Diagnosis not present

## 2022-10-04 DIAGNOSIS — M25562 Pain in left knee: Secondary | ICD-10-CM | POA: Diagnosis not present

## 2022-10-04 DIAGNOSIS — M545 Low back pain, unspecified: Secondary | ICD-10-CM | POA: Diagnosis not present

## 2022-10-19 DIAGNOSIS — S41112A Laceration without foreign body of left upper arm, initial encounter: Secondary | ICD-10-CM | POA: Diagnosis not present

## 2022-10-31 DIAGNOSIS — S76311A Strain of muscle, fascia and tendon of the posterior muscle group at thigh level, right thigh, initial encounter: Secondary | ICD-10-CM | POA: Diagnosis not present

## 2022-10-31 DIAGNOSIS — M545 Low back pain, unspecified: Secondary | ICD-10-CM | POA: Diagnosis not present

## 2022-11-04 DIAGNOSIS — M545 Low back pain, unspecified: Secondary | ICD-10-CM | POA: Diagnosis not present

## 2022-11-04 DIAGNOSIS — M25562 Pain in left knee: Secondary | ICD-10-CM | POA: Diagnosis not present

## 2022-11-07 DIAGNOSIS — Z79899 Other long term (current) drug therapy: Secondary | ICD-10-CM | POA: Diagnosis not present

## 2022-11-08 LAB — LIPID PANEL
Chol/HDL Ratio: 2 ratio (ref 0.0–4.4)
Cholesterol, Total: 162 mg/dL (ref 100–199)
HDL: 81 mg/dL (ref >39)
LDL Chol Calc (NIH): 69 mg/dL (ref 0–99)
Triglycerides: 62 mg/dL (ref 0–149)
VLDL Cholesterol Cal: 12 mg/dL (ref 5–40)

## 2022-11-10 ENCOUNTER — Telehealth: Payer: Self-pay | Admitting: Cardiovascular Disease

## 2022-11-10 NOTE — Telephone Encounter (Signed)
Pt c/o medication issue:  1. Name of Medication: rosuvastatin (CRESTOR) 5 MG tablet   2. How are you currently taking this medication (dosage and times per day)? As written  3. Are you having a reaction (difficulty breathing--STAT)? no  4. What is your medication issue? Pt says her lab results came back normal. She wants to know if she is supposed to continue this medication, please advise.  She may not answer, she ask that you lvm with info

## 2022-11-10 NOTE — Telephone Encounter (Signed)
Patient states being that her LDL is at goal can she stop taking Crestor 5mg . Please advise

## 2022-11-11 NOTE — Telephone Encounter (Signed)
Patient is aware she needs to continue to take crestor. She verbalized understanding

## 2022-11-18 DIAGNOSIS — M25562 Pain in left knee: Secondary | ICD-10-CM | POA: Diagnosis not present

## 2022-11-18 DIAGNOSIS — M545 Low back pain, unspecified: Secondary | ICD-10-CM | POA: Diagnosis not present

## 2022-12-02 ENCOUNTER — Telehealth: Payer: Self-pay | Admitting: Cardiovascular Disease

## 2022-12-02 NOTE — Telephone Encounter (Signed)
Addendum: During phone call, we did discuss limiting sodium intake and wearing compression stockings. Therefore all recommendations were already addressed.

## 2022-12-02 NOTE — Telephone Encounter (Signed)
Both legs are swollen. Swelling goes down overnight. No redness or tenderness. It does leave a little indention for a second or 2- comes right back up. Has not been wearing compression stockings. She elevates her legs on and off throughout the day and she says that it helps.   She is worried that the Crestor is causing the swelling- this has all just started this spring, it has never been this bad, only this bad since she started on Crestor.   She also states that she is prescribed Lasix 20 mg a day, but it makes her pee so much- her PCP said she can do 10 mg instead. I told her that she could always do 10mg  in the morning and 10mg  in the afternoon; since the RX states 20 mg M,W,F.   She verbalized understanding. Informed her that I would send this to her provider and get back to her with any other recommendations- probably after the long weekend.

## 2022-12-02 NOTE — Telephone Encounter (Signed)
Swelling likely coming from taking lower than prescribed dose of Lasix. Crestor does not cause lower extremity swelling. Agree with elevating her legs, would recommend compression stockings, limiting sodium intake, and trying to take full tab instead of half tab of her Lasix as well.

## 2022-12-02 NOTE — Telephone Encounter (Signed)
Pt c/o swelling: STAT is pt has developed SOB within 24 hours  How much weight have you gained and in what time span?  No weight gain  If swelling, where is the swelling located?  Ankles and about 1/2 way up her leg, patient assumes Crestor may be causing the swelling  Are you currently taking a fluid pill?  Furosemide and patient states she also uses Hemp cream  Are you currently SOB?  No   Do you have a log of your daily weights (if so, list)?  No   Have you gained 3 pounds in a day or 5 pounds in a week?    Have you traveled recently?  no

## 2022-12-30 DIAGNOSIS — Q828 Other specified congenital malformations of skin: Secondary | ICD-10-CM | POA: Diagnosis not present

## 2022-12-30 DIAGNOSIS — I10 Essential (primary) hypertension: Secondary | ICD-10-CM | POA: Diagnosis not present

## 2022-12-30 DIAGNOSIS — L299 Pruritus, unspecified: Secondary | ICD-10-CM | POA: Diagnosis not present

## 2022-12-30 DIAGNOSIS — E042 Nontoxic multinodular goiter: Secondary | ICD-10-CM | POA: Diagnosis not present

## 2022-12-30 DIAGNOSIS — R739 Hyperglycemia, unspecified: Secondary | ICD-10-CM | POA: Diagnosis not present

## 2022-12-30 DIAGNOSIS — L821 Other seborrheic keratosis: Secondary | ICD-10-CM | POA: Diagnosis not present

## 2022-12-30 DIAGNOSIS — R7989 Other specified abnormal findings of blood chemistry: Secondary | ICD-10-CM | POA: Diagnosis not present

## 2022-12-30 DIAGNOSIS — D649 Anemia, unspecified: Secondary | ICD-10-CM | POA: Diagnosis not present

## 2023-01-02 DIAGNOSIS — Z1231 Encounter for screening mammogram for malignant neoplasm of breast: Secondary | ICD-10-CM | POA: Diagnosis not present

## 2023-01-04 ENCOUNTER — Encounter: Payer: Self-pay | Admitting: Obstetrics and Gynecology

## 2023-01-06 DIAGNOSIS — Z79899 Other long term (current) drug therapy: Secondary | ICD-10-CM | POA: Diagnosis not present

## 2023-01-06 DIAGNOSIS — I10 Essential (primary) hypertension: Secondary | ICD-10-CM | POA: Diagnosis not present

## 2023-01-06 DIAGNOSIS — J449 Chronic obstructive pulmonary disease, unspecified: Secondary | ICD-10-CM | POA: Diagnosis not present

## 2023-01-06 DIAGNOSIS — D649 Anemia, unspecified: Secondary | ICD-10-CM | POA: Diagnosis not present

## 2023-01-06 DIAGNOSIS — Z1331 Encounter for screening for depression: Secondary | ICD-10-CM | POA: Diagnosis not present

## 2023-01-06 DIAGNOSIS — I700 Atherosclerosis of aorta: Secondary | ICD-10-CM | POA: Diagnosis not present

## 2023-01-06 DIAGNOSIS — R82998 Other abnormal findings in urine: Secondary | ICD-10-CM | POA: Diagnosis not present

## 2023-01-06 DIAGNOSIS — Z1339 Encounter for screening examination for other mental health and behavioral disorders: Secondary | ICD-10-CM | POA: Diagnosis not present

## 2023-01-06 DIAGNOSIS — D692 Other nonthrombocytopenic purpura: Secondary | ICD-10-CM | POA: Diagnosis not present

## 2023-01-06 DIAGNOSIS — R7989 Other specified abnormal findings of blood chemistry: Secondary | ICD-10-CM | POA: Diagnosis not present

## 2023-01-06 DIAGNOSIS — R600 Localized edema: Secondary | ICD-10-CM | POA: Diagnosis not present

## 2023-01-06 DIAGNOSIS — Z0000 Encounter for general adult medical examination without abnormal findings: Secondary | ICD-10-CM | POA: Diagnosis not present

## 2023-03-08 ENCOUNTER — Other Ambulatory Visit: Payer: Self-pay | Admitting: Cardiovascular Disease

## 2023-07-24 ENCOUNTER — Ambulatory Visit: Payer: Medicare PPO | Attending: Cardiovascular Disease | Admitting: Cardiovascular Disease

## 2023-07-24 ENCOUNTER — Telehealth: Payer: Self-pay | Admitting: Pharmacist

## 2023-07-24 ENCOUNTER — Telehealth: Payer: Self-pay | Admitting: Pharmacy Technician

## 2023-07-24 ENCOUNTER — Encounter: Payer: Self-pay | Admitting: Cardiovascular Disease

## 2023-07-24 ENCOUNTER — Other Ambulatory Visit (HOSPITAL_COMMUNITY): Payer: Self-pay

## 2023-07-24 VITALS — BP 120/64 | HR 69 | Ht 60.0 in | Wt 144.0 lb

## 2023-07-24 DIAGNOSIS — I10 Essential (primary) hypertension: Secondary | ICD-10-CM | POA: Diagnosis not present

## 2023-07-24 DIAGNOSIS — E782 Mixed hyperlipidemia: Secondary | ICD-10-CM

## 2023-07-24 DIAGNOSIS — R931 Abnormal findings on diagnostic imaging of heart and coronary circulation: Secondary | ICD-10-CM

## 2023-07-24 DIAGNOSIS — I2510 Atherosclerotic heart disease of native coronary artery without angina pectoris: Secondary | ICD-10-CM

## 2023-07-24 MED ORDER — REPATHA SURECLICK 140 MG/ML ~~LOC~~ SOAJ
1.0000 mL | SUBCUTANEOUS | 1 refills | Status: AC
Start: 2023-07-24 — End: ?

## 2023-07-24 NOTE — Patient Instructions (Signed)

## 2023-07-24 NOTE — Telephone Encounter (Signed)
 Pharmacy Patient Advocate Encounter  Received notification from [employer] that Prior Authorization for repatha  has been APPROVED from 07/12/23 to 07/10/24. Ran test claim, Copay is $40.00 one month. This test claim was processed through [facility]- copay amounts may vary at other pharmacies due to pharmacy/plan contracts, or as the patient moves through the different stages of their insurance plan.   PA #/Case ID/Reference #: [id number]

## 2023-07-24 NOTE — Assessment & Plan Note (Signed)
 Coronary calcium score performed 01/01/2019 was 352.  She is completely asymptomatic.  She is not at goal for secondary to her her score and is statin intolerant.  We will start her on a PCS K9.  LDL goal less than 70.

## 2023-07-24 NOTE — Assessment & Plan Note (Signed)
 History of hyperlipidemia intolerant to statin therapy with lipid profile performed 12/30/2022 revealing a total cholesterol 192, LDL 115 and HDL 66.  We will explore starting a PCSK9.  LDL goal less than 70 given her elevated coronary calcium score.

## 2023-07-24 NOTE — Addendum Note (Signed)
 Addended by: Cheree Ditto on: 07/24/2023 04:12 PM   Modules accepted: Orders

## 2023-07-24 NOTE — Telephone Encounter (Signed)
 Pharmacy Patient Advocate Encounter   Received notification from Pt Calls Messages that prior authorization for repatha  is required/requested.   Insurance verification completed.   The patient is insured through [employer] .   Per test claim: PA required; PA submitted to above mentioned insurance via CoverMyMeds Key/confirmation #/EOC [id number] Status is pending

## 2023-07-24 NOTE — Progress Notes (Signed)
 07/24/2023 JOSELYN EDLING   1940/11/01  [id number]  Primary Physician Onita Rush, MD Primary Cardiologist: Dorn JINNY Lesches MD [facility], [place], [place]  HPI:  Emma [name] is a 83 y.o.  moderately overweight divorced Caucasian female mother of 2 children, grandmother of 5 grandchildren who I initially saw during her hospitalization a for chest pain.  Her primary provider is Dr. Onita.   I last saw her in the office 07/20/2022. Risk factors include tobacco abuse having quit 2 years ago as well as treated hypertension.  Her father may have had a myocardial infarction.  She is never had a heart attack or stroke.  She is retired surveyor, quantity.  She did have a coronary CTA during her hospitalization 12/31/2018 that showed a significant mid nondominant circumflex lesion.  Her coronary calcium  score was 352.  She said no recurrent chest pain   Since I saw her a year ago she continues to do well.  She denies chest pain or shortness of breath.  She apparently had started on 2 statins which she was intolerant to.  She specifically denies chest pain or shortness of breath.   Current Meds  Medication Sig   acetaminophen  (TYLENOL ) 500 MG tablet Take 500-1,900 mg by mouth every 6 (six) hours as needed for mild pain.    aspirin  EC 81 MG tablet Take 81 mg by mouth once a week.   cetaphil (CETAPHIL) cream Apply 1 application topically daily as needed (dry skin).   cholecalciferol  (VITAMIN D ) 1000 units tablet Take 1,000 Units by mouth See admin instructions. Take 1 tablet daily except Sundays.   furosemide  (LASIX ) 20 MG tablet TAKE 1 TABLET BY MOUTH EVERY MONDAY, WEDNESDAY, FRIDAY AS NEEDED   gabapentin  (NEURONTIN ) 300 MG capsule Take 300 mg by mouth at bedtime.   losartan -hydrochlorothiazide  (HYZAAR) 100-12.5 MG tablet Take 1 tablet by mouth daily.   metoprolol  tartrate (LOPRESSOR ) 25 MG tablet Take 1/2 tablet (12.5mg ) by mouth twice daily.   potassium chloride  SA (KLOR-CON  M) 20 MEQ  tablet TAKE 1 TABLET BY MOUTH TWICE DAILY   Propylene Glycol (SYSTANE BALANCE OP) Place 1 drop into both eyes 2 (two) times daily as needed (dry eyes).    Vitamin D , Ergocalciferol , (DRISDOL) 50000 UNITS CAPS Take 50,000 Units by mouth every Sunday.    WIXELA INHUB 250-50 MCG/DOSE AEPB Inhale 1 puff into the lungs daily.     Allergies  Allergen Reactions   Penicillins Hives, Rash and Other (See Comments)    PATIENT HAS HAD A PCN REACTION WITH IMMEDIATE RASH, FACIAL/TONGUE/THROAT SWELLING, SOB, OR LIGHTHEADEDNESS WITH HYPOTENSION:  #  #  #  YES  #  #  #   Has patient had a PCN reaction causing severe rash involving mucus membranes or skin necrosis: no Has patient had a PCN reaction that required hospitalization: no Has patient had a PCN reaction occurring within the last 10 years: #  #  #  YES  #  #  #     Moxifloxacin Other (See Comments)    Unknown      Social History   Socioeconomic History   Marital status: Divorced    Spouse name: Not on file   Number of children: 2   Years of education: Not on file   Highest education level: Not on file  Occupational History   Occupation: Retired  Tobacco Use   Smoking status: Former    Current packs/day: 0.00    Average packs/day:  0.3 packs/day for 50.0 years (12.5 ttl pk-yrs)    Types: Cigarettes    Start date: 08/12/1967    Quit date: 08/11/2017    Years since quitting: 5.9   Smokeless tobacco: Never  Vaping Use   Vaping status: Never Used  Substance and Sexual Activity   Alcohol  use: Yes    Alcohol /week: 2.0 standard drinks of alcohol     Types: 2 Standard drinks or equivalent per week   Drug use: No   Sexual activity: Not Currently    Partners: Male    Birth control/protection: Post-menopausal  Other Topics Concern   Not on file  Social History Narrative   2 caffeine drinks daily    Social Drivers of Corporate Investment Banker Strain: Not on file  Food Insecurity: Not on file  Transportation Needs: Not on file  Physical  Activity: Not on file  Stress: Not on file  Social Connections: Unknown (11/23/2021)   [facility]   Social Network    Social Network: Not on file  Intimate Partner Violence: Unknown (10/14/2021)   Received from [facility], [facility]   HITS    Physically Hurt: Not on file    Insult or Talk Down To: Not on file    Threaten Physical Harm: Not on file    Scream or Curse: Not on file     Review of Systems: General: negative for chills, fever, night sweats or weight changes.  Cardiovascular: negative for chest pain, dyspnea on exertion, edema, orthopnea, palpitations, paroxysmal nocturnal dyspnea or shortness of breath Dermatological: negative for rash Respiratory: negative for cough or wheezing Urologic: negative for hematuria Abdominal: negative for nausea, vomiting, diarrhea, bright red blood per rectum, melena, or hematemesis Neurologic: negative for visual changes, syncope, or dizziness All other systems reviewed and are otherwise negative except as noted above.    Blood pressure 120/64, pulse 69, height 5' (1.524 m), weight 144 lb (65.3 kg), last menstrual period 12/09/1997, SpO2 98%.  General appearance: alert and no distress Neck: no adenopathy, no carotid bruit, no JVD, supple, symmetrical, trachea midline, and thyroid  not enlarged, symmetric, no tenderness/mass/nodules Lungs: clear to auscultation bilaterally Heart: regular rate and rhythm, S1, S2 normal, no murmur, click, rub or gallop Extremities: extremities normal, atraumatic, no cyanosis or edema Pulses: 2+ and symmetric Skin: Skin color, texture, turgor normal. No rashes or lesions Neurologic: Grossly normal  EKG EKG Interpretation Date/Time:  Monday July 24 2023 10:03:34 EST Ventricular Rate:  69 PR Interval:  168 QRS Duration:  86 QT Interval:  388 QTC Calculation: 415 R Axis:   51  Text Interpretation: Normal sinus rhythm Normal ECG When compared with ECG of  01-Jan-2019 08:55, ST no longer depressed in Lateral leads Confirmed by Court Carrier [phone]) on 07/24/2023 10:11:26 AM    ASSESSMENT AND PLAN:   HTN (hypertension) History of essential hypertension her blood pressure measured today 120/64.  She is on losartan , hydrochlorothiazide  and metoprolol .  Hyperlipidemia History of hyperlipidemia intolerant to statin therapy with lipid profile performed 12/30/2022 revealing a total cholesterol 192, LDL 115 and HDL 66.  We will explore starting a PCSK9.  LDL goal less than 70 given her elevated coronary calcium  score.  Elevated coronary artery calcium  score Coronary calcium  score performed 01/01/2019 was 352.  She is completely asymptomatic.  She is not at goal for secondary to her her score and is statin intolerant.  We will start her on a PCS K9.  LDL goal less than 70.  Dorn DOROTHA Lesches MD FACP,FACC,FAHA, [facility] 07/24/2023 10:20 AM

## 2023-07-24 NOTE — Telephone Encounter (Signed)
 In room consult for CAD after app with Dr Allyson Sabal. Patient is statin intolerant. Discussed switching to Repatha. Counseled on storage, site selection, administration, and possible side effects. Will complete PA and contact patient when approved.

## 2023-07-24 NOTE — Assessment & Plan Note (Signed)
 History of essential hypertension her blood pressure measured today 120/64.  She is on losartan, hydrochlorothiazide and metoprolol.

## 2023-09-18 ENCOUNTER — Telehealth: Payer: Self-pay | Admitting: Cardiovascular Disease

## 2023-09-18 NOTE — Telephone Encounter (Signed)
 Spoke with pt, she reports the medication has been out of the fridge since January. Aware will let the pharmacist know to see if we have samples.

## 2023-09-18 NOTE — Telephone Encounter (Signed)
 Pt c/o medication issue:  1. Name of Medication: Repatha  2. How are you currently taking this medication (dosage and times per day)?   3. Are you having a reaction (difficulty breathing--STAT)?   4. What is your medication issue? Patient said she did not refrigerate and medicine went bad- She says she does not know what to do. The pharmacist told her to call her doctor

## 2023-09-21 NOTE — Telephone Encounter (Signed)
 Left message for patient, repatha sample placed at the front desk for pickup

## 2023-10-25 ENCOUNTER — Other Ambulatory Visit: Payer: Self-pay | Admitting: Internal Medicine

## 2023-10-25 DIAGNOSIS — R7989 Other specified abnormal findings of blood chemistry: Secondary | ICD-10-CM

## 2023-10-25 DIAGNOSIS — E0590 Thyrotoxicosis, unspecified without thyrotoxic crisis or storm: Secondary | ICD-10-CM

## 2023-10-27 ENCOUNTER — Other Ambulatory Visit: Payer: Self-pay | Admitting: Cardiovascular Disease

## 2023-11-02 ENCOUNTER — Encounter: Payer: Self-pay | Admitting: Internal Medicine

## 2023-11-03 ENCOUNTER — Ambulatory Visit
Admission: RE | Admit: 2023-11-03 | Discharge: 2023-11-03 | Disposition: A | Discharge status: Home / Self Care | Source: Ambulatory Visit | Attending: Internal Medicine | Admitting: Internal Medicine

## 2023-11-03 DIAGNOSIS — E0590 Thyrotoxicosis, unspecified without thyrotoxic crisis or storm: Secondary | ICD-10-CM

## 2023-11-03 DIAGNOSIS — R7989 Other specified abnormal findings of blood chemistry: Secondary | ICD-10-CM

## 2023-11-07 ENCOUNTER — Other Ambulatory Visit: Payer: Self-pay | Admitting: Internal Medicine

## 2023-11-07 DIAGNOSIS — E042 Nontoxic multinodular goiter: Secondary | ICD-10-CM

## 2023-11-13 ENCOUNTER — Encounter: Payer: Self-pay | Admitting: Internal Medicine

## 2023-11-20 ENCOUNTER — Ambulatory Visit
Admission: RE | Admit: 2023-11-20 | Discharge: 2023-11-20 | Disposition: A | Discharge status: Home / Self Care | Source: Ambulatory Visit | Attending: Internal Medicine | Admitting: Internal Medicine

## 2023-11-20 ENCOUNTER — Other Ambulatory Visit (HOSPITAL_COMMUNITY)
Admission: RE | Admit: 2023-11-20 | Discharge: 2023-11-20 | Disposition: A | Discharge status: Home / Self Care | Source: Ambulatory Visit | Attending: Student | Admitting: Student

## 2023-11-20 DIAGNOSIS — E042 Nontoxic multinodular goiter: Secondary | ICD-10-CM | POA: Insufficient documentation

## 2023-11-22 LAB — CYTOLOGY - NON PAP

## 2023-12-07 ENCOUNTER — Other Ambulatory Visit: Payer: Self-pay | Admitting: Cardiovascular Disease

## 2023-12-22 NOTE — Progress Notes (Signed)
 83 y.o. [id number] postmenopausal female here for annual exam. Divorced.  She reports ***.  Postmenopausal bleeding: *** Pelvic discharge or pain: *** Breast mass, nipple discharge or skin changes : *** Sexually active: ***   Last PAP:     Component Value Date/Time   DIAGPAP  12/24/2020 1226    - Negative for intraepithelial lesion or malignancy (NILM)   DIAGPAP  10/13/2017 0000    NEGATIVE FOR INTRAEPITHELIAL LESIONS OR MALIGNANCY.   ADEQPAP  12/24/2020 1226    Satisfactory for evaluation; transformation zone component ABSENT.   ADEQPAP  10/13/2017 0000    Satisfactory for evaluation  endocervical/transformation zone component PRESENT.   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 pt. unsure of pathology   Carpal tunnel syndrome    Cataract    Condyloma    Constipation    Hx of adenomatous colonic polyps    Hypertension    Hypothyroidism    2016- synthroid d/c'd    Osteoarthritis    OA- knee- L knee, R thumb   Osteopenia    Thyroid  disease    Vitamin D  deficiency    Past Surgical History:  Procedure Laterality Date   BELPHAROPTOSIS REPAIR Bilateral 04/2015   vision correction   BREAST BIOPSY     right    CATARACT EXTRACTION, BILATERAL Bilateral 01/2014, 02/2014   /w IOL   CESAREAN SECTION     x 2    CHOLECYSTECTOMY     EYE SURGERY     GALLBLADDER SURGERY     IR RADIOLOGY PERIPHERAL GUIDED IV START  01/01/2019   IR US  GUIDE VASC ACCESS RIGHT  01/01/2019    LAPAROSCOPIC APPENDECTOMY N/A 05/30/2018   Procedure: APPENDECTOMY LAPAROSCOPIC;  Surgeon: Joyce Nixon, MD;  Location: [facility];  Service: General;  Laterality: N/A;   SHOULDER SURGERY  2006   right    SHOULDER SURGERY Left 07/17/2013    bone spurs, and partial tear of rotator cuff   TOTAL KNEE ARTHROPLASTY Left 08/14/2017   Procedure: LEFT TOTAL KNEE ARTHROPLASTY;  Surgeon: Christie Cox, MD;  Location: [facility] OR;  Service: Orthopedics;  Laterality: Left;   TUBAL LIGATION     Current Outpatient Medications on File Prior to Visit  Medication Sig Dispense Refill   acetaminophen  (TYLENOL ) 500 MG tablet Take 500-1,900 mg by mouth every 6 (six) hours as needed for mild pain.      aspirin  EC 81 MG tablet Take 81 mg by mouth once a week.     cetaphil (CETAPHIL) cream Apply 1 application topically daily as needed (dry skin).     cholecalciferol  (VITAMIN D ) 1000 units tablet Take 1,000 Units by mouth See admin instructions. Take 1 tablet daily except Sundays.     Evolocumab  (REPATHA   SURECLICK) 140 MG/ML SOAJ Inject 140 mg into the skin every 14 (fourteen) days. 6 mL 1   furosemide  (LASIX ) 20 MG tablet TAKE 1 TABLET BY MOUTH EVERY MONDAY, WEDNESDAY, FRIDAY AS NEEDED 45 tablet 1   gabapentin  (NEURONTIN ) 300 MG capsule Take 300 mg by mouth at bedtime.     losartan -hydrochlorothiazide  (HYZAAR) 100-12.5 MG tablet Take 1 tablet by mouth daily. 90 tablet 4   metoprolol  tartrate (LOPRESSOR ) 25 MG tablet TAKE 1/2 TABLET(12.5 MG) BY MOUTH TWICE DAILY 90 tablet 3   potassium chloride  SA (KLOR-CON  M) 20 MEQ tablet TAKE 1 TABLET BY MOUTH TWICE DAILY 180 tablet 3   Propylene Glycol (SYSTANE BALANCE OP) Place 1 drop into both eyes 2 (two) times daily as needed (dry eyes).      Vitamin D , Ergocalciferol , (DRISDOL) 50000 UNITS CAPS Take 50,000 Units by mouth every Sunday.      WIXELA INHUB 250-50 MCG/DOSE AEPB Inhale 1 puff into the lungs daily.     No current facility-administered medications on file prior to visit.    Social History   Socioeconomic History   Marital status: Divorced    Spouse name: Not on file   Number of children: 2   Years of education: Not on file   Highest education level: Not on file  Occupational History   Occupation: Retired  Tobacco Use   Smoking status: Former    Current packs/day: 0.00    Average packs/day: 0.3 packs/day for 50.0 years (12.5 ttl pk-yrs)    Types: Cigarettes    Start date: 08/12/1967    Quit date: 08/11/2017    Years since quitting: 6.3   Smokeless tobacco: Never  Vaping Use   Vaping status: Never Used  Substance and Sexual Activity   Alcohol  use: Yes    Alcohol /week: 2.0 standard drinks of alcohol     Types: 2 Standard drinks or equivalent per week   Drug use: No   Sexual activity: Not Currently    Partners: Male    Birth control/protection: Post-menopausal  Other Topics Concern   Not on file  Social History Narrative   2 caffeine drinks daily    Social Drivers of Corporate investment banker Strain: Not on file  Food Insecurity: Not on file  Transportation Needs: Not on file  Physical Activity: Not on file  Stress: Not on file  Social Connections: Unknown (11/23/2021)   Received from [facility]   Social Network    Social Network: Not on file  Intimate Partner Violence: Unknown (10/14/2021)   Received from [facility]   HITS    Physically Hurt: Not on file    Insult or Talk Down To: Not on file    Threaten Physical Harm: Not on file    Scream or Curse: Not on file   Family History  Problem Relation Age of Onset   Heart disease Father    Diabetes Paternal Grandfather    Asthma Son    Colon cancer Neg Hx    Allergies  Allergen Reactions   Penicillins Hives, Rash and Other (See Comments)    PATIENT HAS HAD A PCN REACTION WITH IMMEDIATE RASH, FACIAL/TONGUE/THROAT SWELLING, SOB, OR LIGHTHEADEDNESS WITH HYPOTENSION:  #  #  #  YES  #  #  #   Has patient had a PCN reaction causing severe rash involving mucus membranes or skin  necrosis: no Has patient had a PCN reaction that required hospitalization: no Has patient had a PCN reaction occurring within the last  10 years: #  #  #  YES  #  #  #     Moxifloxacin Other (See Comments)    Unknown     Statins Other (See Comments)    myalgias      PE There were no vitals filed for this visit. There is no height or weight on file to calculate BMI.  Physical Exam    Assessment and Plan:        There are no diagnoses linked to this encounter. Maximino Spanner, CMA

## 2023-12-25 ENCOUNTER — Other Ambulatory Visit (HOSPITAL_COMMUNITY)
Admission: RE | Admit: 2023-12-25 | Discharge: 2023-12-25 | Disposition: A | Discharge status: Home / Self Care | Source: Ambulatory Visit | Attending: Obstetrics and Gynecology | Admitting: Obstetrics and Gynecology

## 2023-12-25 ENCOUNTER — Encounter: Payer: Self-pay | Admitting: Obstetrics and Gynecology

## 2023-12-25 ENCOUNTER — Ambulatory Visit (INDEPENDENT_AMBULATORY_CARE_PROVIDER_SITE_OTHER): Admitting: Obstetrics and Gynecology

## 2023-12-25 VITALS — BP 110/66 | HR 61 | Resp 14 | Ht 59.25 in | Wt 141.0 lb

## 2023-12-25 DIAGNOSIS — Z87411 Personal history of vaginal dysplasia: Secondary | ICD-10-CM | POA: Diagnosis present

## 2023-12-25 DIAGNOSIS — Z124 Encounter for screening for malignant neoplasm of cervix: Secondary | ICD-10-CM | POA: Diagnosis present

## 2023-12-25 DIAGNOSIS — Z9189 Other specified personal risk factors, not elsewhere classified: Secondary | ICD-10-CM | POA: Diagnosis not present

## 2023-12-25 DIAGNOSIS — Z1331 Encounter for screening for depression: Secondary | ICD-10-CM | POA: Diagnosis not present

## 2023-12-25 DIAGNOSIS — Z13820 Encounter for screening for osteoporosis: Secondary | ICD-10-CM

## 2023-12-25 DIAGNOSIS — Z01419 Encounter for gynecological examination (general) (routine) without abnormal findings: Secondary | ICD-10-CM | POA: Insufficient documentation

## 2023-12-25 DIAGNOSIS — M810 Age-related osteoporosis without current pathological fracture: Secondary | ICD-10-CM | POA: Insufficient documentation

## 2023-12-25 NOTE — Patient Instructions (Signed)

## 2023-12-25 NOTE — Assessment & Plan Note (Signed)
 Cervical cancer screening performed according to ASCCP guidelines. Encouraged annual mammogram screening Colonoscopy UTD DXA due, ordered Labs and immunizations with her primary Encouraged safe sexual practices as indicated Encouraged healthy lifestyle practices with diet and exercise For patients over 84yo, I recommend 1200mg  calcium  daily and 800IU of vitamin D  daily.

## 2023-12-25 NOTE — Assessment & Plan Note (Signed)
 Continue vitamin D +Calcium  Encouraged weight based exercise DXA due, ordered Patient still unsure about treatment

## 2023-12-25 NOTE — Progress Notes (Signed)
 83 y.o. [id number] postmenopausal female with osteoporosis (declined treatment) and multiple medical comorbidities here for annual exam. Divorced.  She reports ongoing constipation. Taking stool softners 2x/wk. Hx of fecal staining and mild mixed urinary incontinence, reported at 2021 annual exam.  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   ADEQPAP  12/24/2020 1226    Satisfactory for evaluation; transformation zone component ABSENT.   ADEQPAP  10/13/2017 0000    Satisfactory for evaluation  endocervical/transformation zone component PRESENT.   Last mammogram: 01/02/23 BI-RADS 1, density B Last DXA: 01/21/21 osteopenia, elevated FRAX 13% overall fracture and 3% hip fracture Last colonoscopy: 01/29/14 +polyps, was told no more colonoscopies.   Exercising: Yes. 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 pt. unsure of pathology   Carpal tunnel syndrome    Cataract    Condyloma    Constipation    Hx of adenomatous colonic polyps    Hypertension     Hypothyroidism    2016- synthroid d/c'd    Osteoarthritis    OA- knee- L knee, R thumb   Osteopenia    Thyroid  disease    Vitamin D  deficiency    Past Surgical History:  Procedure Laterality Date   BELPHAROPTOSIS REPAIR Bilateral 04/2015   vision correction   BREAST BIOPSY     right    CATARACT EXTRACTION, BILATERAL Bilateral 01/2014, 02/2014   /w IOL   CESAREAN SECTION     x 2    CHOLECYSTECTOMY     EYE SURGERY     GALLBLADDER SURGERY     IR RADIOLOGY PERIPHERAL GUIDED IV START  01/01/2019   IR US  GUIDE VASC ACCESS RIGHT  01/01/2019   LAPAROSCOPIC APPENDECTOMY N/A 05/30/2018   Procedure: APPENDECTOMY LAPAROSCOPIC;  Surgeon: Joyce Nixon, MD;  Location: [facility];  Service: General;  Laterality: N/A;   SHOULDER SURGERY  2006   right    SHOULDER SURGERY Left 07/17/2013    bone spurs, and partial tear of rotator cuff   TOTAL KNEE ARTHROPLASTY Left 08/14/2017   Procedure: LEFT TOTAL KNEE ARTHROPLASTY;  Surgeon: Christie Cox, MD;  Location: [facility] OR;  Service: Orthopedics;  Laterality: Left;   TUBAL LIGATION     Current Outpatient Medications on File Prior to Visit  Medication  Sig Dispense Refill   acetaminophen  (TYLENOL ) 500 MG tablet Take 500-1,900 mg by mouth every 6 (six) hours as needed for mild pain.      aspirin  EC 81 MG tablet Take 81 mg by mouth once a week.     cetaphil (CETAPHIL) cream Apply 1 application topically daily as needed (dry skin).     cholecalciferol  (VITAMIN D ) 1000 units tablet Take 1,000 Units by mouth See admin instructions. Take 1 tablet daily except Sundays.     Evolocumab  (REPATHA  SURECLICK) 140 MG/ML SOAJ Inject 140 mg into the skin every 14 (fourteen) days. 6 mL 1   furosemide  (LASIX ) 20 MG tablet TAKE 1 TABLET BY MOUTH EVERY MONDAY, WEDNESDAY, FRIDAY AS NEEDED 45 tablet 1   gabapentin  (NEURONTIN ) 300 MG capsule Take 300 mg by mouth at bedtime.     hydrOXYzine (ATARAX) 10 MG tablet Take 10 mg by mouth daily.     losartan -hydrochlorothiazide  (HYZAAR)  100-12.5 MG tablet Take 1 tablet by mouth daily. 90 tablet 4   methimazole (TAPAZOLE) 5 MG tablet Take 5 mg by mouth daily.     metoprolol  tartrate (LOPRESSOR ) 25 MG tablet TAKE 1/2 TABLET(12.5 MG) BY MOUTH TWICE DAILY 90 tablet 3   potassium chloride  SA (KLOR-CON  M) 20 MEQ tablet TAKE 1 TABLET BY MOUTH TWICE DAILY 180 tablet 3   Propylene Glycol (SYSTANE BALANCE OP) Place 1 drop into both eyes 2 (two) times daily as needed (dry eyes).      Vitamin D , Ergocalciferol , (DRISDOL) 50000 UNITS CAPS Take 50,000 Units by mouth every Sunday.      WIXELA INHUB 250-50 MCG/DOSE AEPB Inhale 1 puff into the lungs daily.     No current facility-administered medications on file prior to visit.   Social History   Socioeconomic History   Marital status: Divorced    Spouse name: Not on file   Number of children: 2   Years of education: Not on file   Highest education level: Not on file  Occupational History   Occupation: Retired  Tobacco Use   Smoking status: Former    Current packs/day: 0.00    Average packs/day: 0.3 packs/day for 50.0 years (12.5 ttl pk-yrs)    Types: Cigarettes    Start date: 08/12/1967    Quit date: 08/11/2017    Years since quitting: 6.3   Smokeless tobacco: Never  Vaping Use   Vaping status: Never Used  Substance and Sexual Activity   Alcohol  use: Yes    Alcohol /week: 2.0 standard drinks of alcohol     Types: 2 Standard drinks or equivalent per week   Drug use: No   Sexual activity: Not Currently    Partners: Male    Birth control/protection: Post-menopausal    Comment: 1st intercourse- early 20's, partners-5, DES-- unknown, no STD history  Other Topics Concern   Not on file  Social History Narrative   2 caffeine drinks daily    Social Drivers of Corporate investment banker Strain: Not on file  Food Insecurity: Not on file  Transportation Needs: Not on file  Physical Activity: Not on file  Stress: Not on file  Social Connections: Unknown (11/23/2021)   Received  from [facility]   Social Network    Social Network: Not on file  Intimate Partner Violence: Unknown (10/14/2021)   Received from [facility]   HITS    Physically Hurt: Not on file    Insult or Talk Down To: Not on file    Threaten Physical Harm:  Not on file    Scream or Curse: Not on file   Family History  Problem Relation Age of Onset   Heart disease Father    Diabetes Paternal Grandfather    Asthma Son    Colon cancer Neg Hx    Allergies  Allergen Reactions   Penicillins Hives, Rash and Other (See Comments)    PATIENT HAS HAD A PCN REACTION WITH IMMEDIATE RASH, FACIAL/TONGUE/THROAT SWELLING, SOB, OR LIGHTHEADEDNESS WITH HYPOTENSION:  #  #  #  YES  #  #  #   Has patient had a PCN reaction causing severe rash involving mucus membranes or skin necrosis: no Has patient had a PCN reaction that required hospitalization: no Has patient had a PCN reaction occurring within the last 10 years: #  #  #  YES  #  #  #     Moxifloxacin Other (See Comments)    Unknown     Statins Other (See Comments)    myalgias      PE Today's Vitals   12/25/23 1000  BP: 110/66  Pulse: 61  Resp: 14  Weight: 141 lb (64 kg)  Height: 4' 11.25 (1.505 m)   Body mass index is 28.24 kg/m.  Physical Exam Vitals reviewed. Exam conducted with a chaperone present.  Constitutional:      General: She is not in acute distress.    Appearance: Normal appearance.  HENT:     Head: Normocephalic and atraumatic.     Nose: Nose normal.   Eyes:     Extraocular Movements: Extraocular movements intact.     Conjunctiva/sclera: Conjunctivae normal.   Neck:     Thyroid : No thyroid  mass, thyromegaly or thyroid  tenderness.  Pulmonary:     Effort: Pulmonary effort is normal.  Chest:     Chest wall: No mass or tenderness.  Breasts:    Right: Normal. No swelling, mass, nipple discharge, skin change or tenderness.     Left: Normal. No swelling, mass, nipple discharge, skin change or tenderness.  Abdominal:      General: There is no distension.     Palpations: Abdomen is soft.     Tenderness: There is no abdominal tenderness.  Genitourinary:    General: Normal vulva.     Exam position: Lithotomy position.     Urethra: No prolapse.     Vagina: Normal. No vaginal discharge or bleeding.     Cervix: Normal. No lesion.     Uterus: Normal. Not enlarged and not tender.      Adnexa: Right adnexa normal and left adnexa normal.   Musculoskeletal:        General: Normal range of motion.     Cervical back: Normal range of motion.  Lymphadenopathy:     Upper Body:     Right upper body: No axillary adenopathy.     Left upper body: No axillary adenopathy.     Lower Body: No right inguinal adenopathy. No left inguinal adenopathy.   Skin:    General: Skin is warm and dry.   Neurological:     General: No focal deficit present.     Mental Status: She is alert.   Psychiatric:        Mood and Affect: Mood normal.        Behavior: Behavior normal.       Assessment and Plan:        Encounter for breast and pelvic examination Assessment & Plan: Cervical cancer screening performed according to  ASCCP guidelines. Encouraged annual mammogram screening Colonoscopy UTD DXA due, ordered Labs and immunizations with her primary Encouraged safe sexual practices as indicated Encouraged healthy lifestyle practices with diet and exercise For patients over 70yo, I recommend 1200mg  calcium  daily and 800IU of vitamin D  daily.    Negative depression screening  History of intraepithelial neoplasia of vagina -     Cytology - PAP  Other specified personal risk factors, not elsewhere classified  Cervical cancer screening -     Cytology - PAP  Fracture Risk Assessment Score (FRAX) indicating greater than 3% risk for hip fracture Assessment & Plan: Continue vitamin D +Calcium  Encouraged weight based exercise DXA due, ordered Patient still unsure about treatment   Orders: -     DG Bone Density;  Future  Screening for osteoporosis -     DG Bone Density; Future  Age-related osteoporosis without current pathological fracture -     DG Bone Density; Future   Romaine Closs, MD

## 2023-12-27 ENCOUNTER — Ambulatory Visit: Payer: Self-pay | Admitting: Obstetrics and Gynecology

## 2023-12-27 LAB — CYTOLOGY - PAP
Diagnosis: NEGATIVE
Diagnosis: REACTIVE

## 2024-01-02 ENCOUNTER — Other Ambulatory Visit: Payer: Self-pay | Admitting: Internal Medicine

## 2024-01-02 ENCOUNTER — Encounter: Payer: Self-pay | Admitting: Internal Medicine

## 2024-01-02 DIAGNOSIS — R7989 Other specified abnormal findings of blood chemistry: Secondary | ICD-10-CM

## 2024-01-02 DIAGNOSIS — R748 Abnormal levels of other serum enzymes: Secondary | ICD-10-CM

## 2024-01-03 ENCOUNTER — Other Ambulatory Visit: Payer: Self-pay | Admitting: Cardiovascular Disease

## 2024-01-03 LAB — HM DEXA SCAN

## 2024-01-03 LAB — HM MAMMOGRAPHY

## 2024-01-04 ENCOUNTER — Ambulatory Visit
Admission: RE | Admit: 2024-01-04 | Discharge: 2024-01-04 | Disposition: A | Discharge status: Home / Self Care | Source: Ambulatory Visit | Attending: Internal Medicine | Admitting: Internal Medicine

## 2024-01-04 DIAGNOSIS — R7989 Other specified abnormal findings of blood chemistry: Secondary | ICD-10-CM

## 2024-01-04 DIAGNOSIS — R748 Abnormal levels of other serum enzymes: Secondary | ICD-10-CM

## 2024-01-05 ENCOUNTER — Telehealth: Payer: Self-pay | Admitting: Obstetrics and Gynecology

## 2024-01-05 NOTE — Telephone Encounter (Signed)
 [facility] records received for her most recent mammogram and bone density scan. BI-RADS 2 density B Osteopenia with elevated FRAX: 13% and 3.5% for risk of hip fracture.  Please call patient to review abnormal bone density scan.  Continue to recommend treatment for osteoporosis.  Please offer appointment to discuss treatment.

## 2024-01-05 NOTE — Telephone Encounter (Signed)
 Left message to call back

## 2024-01-11 ENCOUNTER — Encounter: Payer: Self-pay | Admitting: Obstetrics and Gynecology

## 2024-01-11 ENCOUNTER — Ambulatory Visit: Payer: Self-pay | Admitting: Obstetrics and Gynecology

## 2024-01-15 ENCOUNTER — Encounter: Payer: Self-pay | Admitting: Obstetrics and Gynecology

## 2024-01-16 ENCOUNTER — Ambulatory Visit: Payer: Self-pay | Admitting: Obstetrics and Gynecology

## 2024-01-25 NOTE — Telephone Encounter (Signed)
 Left message to call [facility] Triage at [phone], option 4.

## 2024-01-26 NOTE — Telephone Encounter (Signed)
 Spoke with patient, advised per Dr. Dallie.  OV scheduled for 02/12/24 at 1430 with Dr. Dallie.   Patient agreeable to date and time.  Encounter closed.

## 2024-02-12 ENCOUNTER — Ambulatory Visit: Admitting: Obstetrics and Gynecology

## 2024-07-24 ENCOUNTER — Encounter: Payer: Self-pay | Admitting: Cardiovascular Disease

## 2024-07-24 ENCOUNTER — Ambulatory Visit: Attending: Cardiology | Admitting: Cardiovascular Disease

## 2024-07-24 VITALS — BP 130/66 | HR 66 | Ht 60.0 in | Wt 141.8 lb

## 2024-07-24 DIAGNOSIS — R931 Abnormal findings on diagnostic imaging of heart and coronary circulation: Secondary | ICD-10-CM | POA: Diagnosis not present

## 2024-07-24 DIAGNOSIS — E782 Mixed hyperlipidemia: Secondary | ICD-10-CM

## 2024-07-24 DIAGNOSIS — I2510 Atherosclerotic heart disease of native coronary artery without angina pectoris: Secondary | ICD-10-CM | POA: Diagnosis not present

## 2024-07-24 DIAGNOSIS — I10 Essential (primary) hypertension: Secondary | ICD-10-CM

## 2024-07-24 MED ORDER — METOPROLOL TARTRATE 25 MG PO TABS: 25.0000 mg | ORAL_TABLET | Freq: Two times a day (BID) | ORAL | 3 refills | Status: DC

## 2024-07-24 NOTE — Progress Notes (Signed)
 "     07/24/2024 Emma [name]   [birth year]  [id number]  Primary Physician Onita Rush, MD Primary Cardiologist: Dorn JINNY Lesches MD [facility], [place], [place]  HPI:  Emma [name] is a 84 y.o.  moderately overweight divorced Caucasian female mother of 2 children, grandmother of 5 grandchildren who I initially saw during her hospitalization a for chest pain.  Her primary provider is Dr. Onita.   I last saw her in the office 07/24/2023. Risk factors include tobacco abuse having quit 2 years ago as well as treated hypertension.  Her father may have had a myocardial infarction.  She is never had a heart attack or stroke.  She is retired surveyor, quantity.  She did have a coronary CTA during her hospitalization 12/31/2018 that showed a significant mid nondominant circumflex lesion.  Her coronary calcium  score was 352.  She said no recurrent chest pain   Since I saw her a year ago she continues to do well.  She denies chest pain or shortness of breath.  She is fairly active and does water aerobics.  She apparently is statin intolerant and was started on Repatha  which she has not yet begun to use.   Active Medications[1]   Allergies[2]  Social History   Socioeconomic History   Marital status: Divorced    Spouse name: Not on file   Number of children: 2   Years of education: Not on file   Highest education level: Not on file  Occupational History   Occupation: Retired  Tobacco Use   Smoking status: Former    Current packs/day: 0.00    Average packs/day: 0.3 packs/day for 50.0 years (12.5 ttl pk-yrs)    Types: Cigarettes    Start date: 08/12/1967    Quit date: 08/11/2017    Years since quitting: 6.9   Smokeless tobacco: Never  Vaping Use   Vaping status: Never Used  Substance and Sexual Activity   Alcohol  use: Yes    Alcohol /week: 2.0 standard drinks of alcohol     Types: 2 Standard drinks or equivalent per week   Drug use: No   Sexual activity: Not Currently    Partners: Male     Birth control/protection: Post-menopausal    Comment: 1st intercourse- early 20's, partners-5, DES-- unknown, no STD history  Other Topics Concern   Not on file  Social History Narrative   2 caffeine drinks daily    Social Drivers of Health   Tobacco Use: Medium Risk (07/24/2024)   Patient History    Smoking Tobacco Use: Former    Smokeless Tobacco Use: Never    Passive Exposure: Not on Actuary Strain: Not on file  Food Insecurity: Not on file  Transportation Needs: Not on file  Physical Activity: Not on file  Stress: Not on file  Social Connections: Unknown (11/23/2021)   Received from [facility]   Social Network    Social Network: Not on file  Intimate Partner Violence: Unknown (10/14/2021)   Received from [facility]   HITS    Physically Hurt: Not on file    Insult or Talk Down To: Not on file    Threaten Physical Harm: Not on file    Scream or Curse: Not on file  Depression (PHQ2-9): Low Risk (12/25/2023)   Depression (PHQ2-9)    PHQ-2 Score: 0  Alcohol  Screen: Not on file  Housing: Not on file  Utilities: Not on file  Health Literacy: Not on file  Review of Systems: General: negative for chills, fever, night sweats or weight changes.  Cardiovascular: negative for chest pain, dyspnea on exertion, edema, orthopnea, palpitations, paroxysmal nocturnal dyspnea or shortness of breath Dermatological: negative for rash Respiratory: negative for cough or wheezing Urologic: negative for hematuria Abdominal: negative for nausea, vomiting, diarrhea, bright red blood per rectum, melena, or hematemesis Neurologic: negative for visual changes, syncope, or dizziness All other systems reviewed and are otherwise negative except as noted above.    Blood pressure 130/66, pulse 66, height 5' (1.524 m), weight 141 lb 12.8 oz (64.3 kg), last menstrual period 12/09/1997, SpO2 99%.  General appearance: alert and no distress Neck: no adenopathy, no carotid  bruit, no JVD, supple, symmetrical, trachea midline, and thyroid  not enlarged, symmetric, no tenderness/mass/nodules Lungs: clear to auscultation bilaterally Heart: regular rate and rhythm, S1, S2 normal, no murmur, click, rub or gallop Extremities: extremities normal, atraumatic, no cyanosis or edema Pulses: 2+ and symmetric Skin: Skin color, texture, turgor normal. No rashes or lesions Neurologic: Grossly normal  EKG EKG Interpretation Date/Time:  Wednesday July 24 2024 11:03:02 EST Ventricular Rate:  66 PR Interval:  172 QRS Duration:  86 QT Interval:  406 QTC Calculation: 425 R Axis:   15  Text Interpretation: Normal sinus rhythm Normal ECG When compared with ECG of 24-Jul-2023 10:03, No significant change was found Confirmed by Court Carrier [phone]) on 07/24/2024 11:24:42 AM    ASSESSMENT AND PLAN:   HTN (hypertension) History of essential hypertension blood pressure measured today at 130/66.  She is on losartan /hydrochlorothiazide  and metoprolol .  Hyperlipidemia History of hyperlipidemia intolerant to statin therapy she.  She was prescribed Repatha  but has never started it.  Her last lipid profile performed 01/01/2024 revealed total cholesterol 232, LDL 128 and HDL of 94, not at goal considering her elevated coronary calcium  score of 352.  I am going to refer her back to Pharm.D. to be reeducated on the use of Repatha .  Elevated coronary artery calcium  score Coronary calcium  score performed 12/29/2018 was 352 distributed in all 3 coronary arteries.  FFR analysis was negative.  She has had no recurrent chest pain.     Carrier DOROTHA Court MD FACP,FACC,FAHA, FSCAI 07/24/2024 11:34 AM     [1]  Current Meds  Medication Sig   acetaminophen  (TYLENOL ) 500 MG tablet Take 500-1,900 mg by mouth every 6 (six) hours as needed for mild pain.    aspirin  EC 81 MG tablet Take 81 mg by mouth once a week.   cetaphil (CETAPHIL) cream Apply 1 application topically daily as needed (dry  skin).   cholecalciferol  (VITAMIN D ) 1000 units tablet Take 1,000 Units by mouth See admin instructions. Take 1 tablet daily except Sundays.   Evolocumab  (REPATHA  SURECLICK) 140 MG/ML SOAJ Inject 140 mg into the skin every 14 (fourteen) days.   furosemide  (LASIX ) 20 MG tablet TAKE 1 TABLET BY MOUTH EVERY MONDAY, WEDNESDAY, FRIDAY AS NEEDED   gabapentin  (NEURONTIN ) 300 MG capsule Take 300 mg by mouth at bedtime.   losartan -hydrochlorothiazide  (HYZAAR) 100-12.5 MG tablet Take 1 tablet by mouth daily.   metoprolol  tartrate (LOPRESSOR ) 25 MG tablet TAKE 1/2 TABLET(12.5 MG) BY MOUTH TWICE DAILY (Patient taking differently: Take 25 mg by mouth 2 (two) times daily. TAKE 1/2 TABLET(12.5 MG) BY MOUTH TWICE DAILY)   potassium chloride  SA (KLOR-CON  M) 20 MEQ tablet TAKE 1 TABLET BY MOUTH TWICE DAILY   Propylene Glycol (SYSTANE BALANCE OP) Place 1 drop into both eyes 2 (two) times daily as needed (dry  eyes).    WIXELA INHUB 250-50 MCG/DOSE AEPB Inhale 1 puff into the lungs daily.  [2]  Allergies Allergen Reactions   Penicillins Hives, Rash and Other (See Comments)    PATIENT HAS HAD A PCN REACTION WITH IMMEDIATE RASH, FACIAL/TONGUE/THROAT SWELLING, SOB, OR LIGHTHEADEDNESS WITH HYPOTENSION:  #  #  #  YES  #  #  #   Has patient had a PCN reaction causing severe rash involving mucus membranes or skin necrosis: no Has patient had a PCN reaction that required hospitalization: no Has patient had a PCN reaction occurring within the last 10 years: #  #  #  YES  #  #  #     Moxifloxacin Other (See Comments)    Unknown     Statins Other (See Comments)    myalgias   "

## 2024-07-24 NOTE — Patient Instructions (Signed)
 Medication Instructions:  Your physician has recommended you make the following change in your medication:   -Start taking Repatha  140mg  every 14 days.  *If you need a refill on your cardiac medications before your next appointment, please call your pharmacy*  Lab Work: Your physician recommends that you return for lab work in: 3 months after starting Repatha - for FASTING lipid panel  If you have labs (blood work) drawn today and your tests are completely normal, you will receive your results only by: MyChart Message (if you have MyChart) OR A paper copy in the mail If you have any lab test that is abnormal or we need to change your treatment, we will call you to review the results.  Follow-Up: At [facility], you and your health needs are our priority.  As part of our continuing mission to provide you with exceptional heart care, our providers are all part of one team.  This team includes your primary Cardiologist (physician) and Advanced Practice Providers or APPs (Physician Assistants and Nurse Practitioners) who all work together to provide you with the care you need, when you need it.  Your next appointment:   12 month(s)  Provider:   Dorn Lesches, MD    We recommend signing up for the patient portal called MyChart.  Sign up information is provided on this After Visit Summary.  MyChart is used to connect with patients for Virtual Visits (Telemedicine).  Patients are able to view lab/test results, encounter notes, upcoming appointments, etc.  Non-urgent messages can be sent to your provider as well.   To learn more about what you can do with MyChart, go to [email or web address].   Other Instructions We will schedule you an appointment to see a clinical Pharmacist to teach you how to inject Repatha .

## 2024-07-24 NOTE — Assessment & Plan Note (Signed)
 Coronary calcium  score performed 12/29/2018 was 352 distributed in all 3 coronary arteries.  FFR analysis was negative.  She has had no recurrent chest pain.

## 2024-07-24 NOTE — Assessment & Plan Note (Signed)
 History of essential hypertension blood pressure measured today at 130/66.  She is on losartan /hydrochlorothiazide  and metoprolol .

## 2024-07-24 NOTE — Assessment & Plan Note (Signed)
 History of hyperlipidemia intolerant to statin therapy she.  She was prescribed Repatha  but has never started it.  Her last lipid profile performed 01/01/2024 revealed total cholesterol 232, LDL 128 and HDL of 94, not at goal considering her elevated coronary calcium  score of 352.  I am going to refer her back to Pharm.D. to be reeducated on the use of Repatha .

## 2024-07-30 ENCOUNTER — Other Ambulatory Visit: Payer: Self-pay | Admitting: Cardiovascular Disease

## 2024-08-05 MED ORDER — METOPROLOL TARTRATE 25 MG PO TABS: 25.0000 mg | ORAL_TABLET | Freq: Two times a day (BID) | ORAL | 3 refills | Status: AC

## 2024-08-14 ENCOUNTER — Ambulatory Visit

## 2024-09-23 ENCOUNTER — Ambulatory Visit: Admitting: Pharmacist
